# Patient Record
Sex: Female | Born: 1990 | Race: White | Hispanic: No | Marital: Single | Smoking: Former smoker
Health system: Southern US, Community
[De-identification: ages and names within clinical notes are randomized; demographics above are authoritative.]

## PROBLEM LIST (undated history)

## (undated) DIAGNOSIS — R519 Headache, unspecified: Secondary | ICD-10-CM

## (undated) DIAGNOSIS — Z9889 Other specified postprocedural states: Secondary | ICD-10-CM

## (undated) DIAGNOSIS — D649 Anemia, unspecified: Secondary | ICD-10-CM

## (undated) DIAGNOSIS — F419 Anxiety disorder, unspecified: Secondary | ICD-10-CM

## (undated) DIAGNOSIS — R569 Unspecified convulsions: Secondary | ICD-10-CM

## (undated) DIAGNOSIS — F909 Attention-deficit hyperactivity disorder, unspecified type: Secondary | ICD-10-CM

## (undated) DIAGNOSIS — R51 Headache: Secondary | ICD-10-CM

## (undated) DIAGNOSIS — N83209 Unspecified ovarian cyst, unspecified side: Secondary | ICD-10-CM

## (undated) DIAGNOSIS — N809 Endometriosis, unspecified: Secondary | ICD-10-CM

## (undated) DIAGNOSIS — Z87442 Personal history of urinary calculi: Secondary | ICD-10-CM

## (undated) DIAGNOSIS — Z9189 Other specified personal risk factors, not elsewhere classified: Secondary | ICD-10-CM

## (undated) DIAGNOSIS — M549 Dorsalgia, unspecified: Secondary | ICD-10-CM

## (undated) DIAGNOSIS — G545 Neuralgic amyotrophy: Secondary | ICD-10-CM

## (undated) DIAGNOSIS — F319 Bipolar disorder, unspecified: Secondary | ICD-10-CM

## (undated) DIAGNOSIS — Z9049 Acquired absence of other specified parts of digestive tract: Secondary | ICD-10-CM

## (undated) DIAGNOSIS — N739 Female pelvic inflammatory disease, unspecified: Secondary | ICD-10-CM

## (undated) HISTORY — PX: LAPAROSCOPY: SHX197

## (undated) HISTORY — DX: Anxiety disorder, unspecified: F41.9

## (undated) HISTORY — DX: Unspecified ovarian cyst, unspecified side: N83.209

## (undated) HISTORY — DX: Endometriosis, unspecified: N80.9

## (undated) HISTORY — DX: Female pelvic inflammatory disease, unspecified: N73.9

## (undated) HISTORY — PX: TONSILLECTOMY: SUR1361

---
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
## 2014-12-22 ENCOUNTER — Emergency Department (HOSPITAL_COMMUNITY): Payer: Medicaid Other

## 2014-12-22 ENCOUNTER — Emergency Department (HOSPITAL_COMMUNITY)
Admission: EM | Admit: 2014-12-22 | Discharge: 2014-12-22 | Disposition: A | Discharge status: Home / Self Care | Payer: Medicaid Other | Attending: Emergency Medicine | Admitting: Emergency Medicine

## 2014-12-22 ENCOUNTER — Encounter (HOSPITAL_COMMUNITY): Payer: Self-pay | Admitting: *Deleted

## 2014-12-22 DIAGNOSIS — Z79899 Other long term (current) drug therapy: Secondary | ICD-10-CM | POA: Insufficient documentation

## 2014-12-22 DIAGNOSIS — Z3202 Encounter for pregnancy test, result negative: Secondary | ICD-10-CM | POA: Insufficient documentation

## 2014-12-22 DIAGNOSIS — J45909 Unspecified asthma, uncomplicated: Secondary | ICD-10-CM | POA: Insufficient documentation

## 2014-12-22 DIAGNOSIS — R1031 Right lower quadrant pain: Secondary | ICD-10-CM | POA: Insufficient documentation

## 2014-12-22 DIAGNOSIS — R109 Unspecified abdominal pain: Secondary | ICD-10-CM

## 2014-12-22 LAB — CBC WITH DIFFERENTIAL/PLATELET
BASOS ABS: 0 10*3/uL (ref 0.0–0.1)
Basophils Relative: 0 % (ref 0–1)
Eosinophils Absolute: 0.1 10*3/uL (ref 0.0–0.7)
Eosinophils Relative: 1 % (ref 0–5)
HCT: 38.3 % (ref 36.0–46.0)
Hemoglobin: 13.2 g/dL (ref 12.0–15.0)
LYMPHS ABS: 2 10*3/uL (ref 0.7–4.0)
LYMPHS PCT: 24 % (ref 12–46)
MCH: 30.1 pg (ref 26.0–34.0)
MCHC: 34.5 g/dL (ref 30.0–36.0)
MCV: 87.4 fL (ref 78.0–100.0)
MONO ABS: 0.7 10*3/uL (ref 0.1–1.0)
Monocytes Relative: 9 % (ref 3–12)
NEUTROS PCT: 66 % (ref 43–77)
Neutro Abs: 5.4 10*3/uL (ref 1.7–7.7)
PLATELETS: 295 10*3/uL (ref 150–400)
RBC: 4.38 MIL/uL (ref 3.87–5.11)
RDW: 11.6 % (ref 11.5–15.5)
WBC: 8.2 10*3/uL (ref 4.0–10.5)

## 2014-12-22 LAB — COMPREHENSIVE METABOLIC PANEL
ALK PHOS: 51 U/L (ref 39–117)
ALT: 11 U/L (ref 0–35)
ANION GAP: 10 (ref 5–15)
AST: 25 U/L (ref 0–37)
Albumin: 4 g/dL (ref 3.5–5.2)
BUN: 7 mg/dL (ref 6–23)
CALCIUM: 9.3 mg/dL (ref 8.4–10.5)
CHLORIDE: 102 mmol/L (ref 96–112)
CO2: 24 mmol/L (ref 19–32)
CREATININE: 0.67 mg/dL (ref 0.50–1.10)
GFR calc non Af Amer: >90 mL/min (ref >90)
GLUCOSE: 92 mg/dL (ref 70–99)
Potassium: 3.6 mmol/L (ref 3.5–5.1)
SODIUM: 136 mmol/L (ref 135–145)
TOTAL PROTEIN: 7.4 g/dL (ref 6.0–8.3)
Total Bilirubin: 0.3 mg/dL (ref 0.3–1.2)

## 2014-12-22 LAB — URINE MICROSCOPIC-ADD ON

## 2014-12-22 LAB — I-STAT CHEM 8, ED
BUN: 7 mg/dL (ref 6–23)
CREATININE: 0.6 mg/dL (ref 0.50–1.10)
Calcium, Ion: 1.16 mmol/L (ref 1.12–1.23)
Chloride: 100 mmol/L (ref 96–112)
Glucose, Bld: 95 mg/dL (ref 70–99)
HCT: 42 % (ref 36.0–46.0)
HEMOGLOBIN: 14.3 g/dL (ref 12.0–15.0)
Potassium: 3.7 mmol/L (ref 3.5–5.1)
SODIUM: 139 mmol/L (ref 135–145)
TCO2: 22 mmol/L (ref 0–100)

## 2014-12-22 LAB — URINALYSIS, ROUTINE W REFLEX MICROSCOPIC
Bilirubin Urine: NEGATIVE
Glucose, UA: NEGATIVE mg/dL
Ketones, ur: NEGATIVE mg/dL
LEUKOCYTES UA: NEGATIVE
NITRITE: NEGATIVE
PH: 7.5 (ref 5.0–8.0)
Protein, ur: NEGATIVE mg/dL
SPECIFIC GRAVITY, URINE: 1.01 (ref 1.005–1.030)
UROBILINOGEN UA: 0.2 mg/dL (ref 0.0–1.0)

## 2014-12-22 LAB — POC URINE PREG, ED: PREG TEST UR: NEGATIVE

## 2014-12-22 IMAGING — CT CT ABD-PELV W/O CM
2 of 4 series · 16 of 46 positions shown, 18 images · non-contrast
Comparison: [DATE]

CLINICAL DATA: Right flank pain and fever for 5 days.

EXAM:
CT ABDOMEN AND PELVIS WITHOUT CONTRAST
TECHNIQUE: Multidetector CT imaging of the abdomen and pelvis was performed
following the standard protocol without IV contrast.

[Series 2: abd/ pelvis 5.0 i30f 1 · axial · 0.65mm/px · z∈[-498,-108]mm · 13 of 86 slices shown, 15 images]
[im 4/86  soft-tissue]
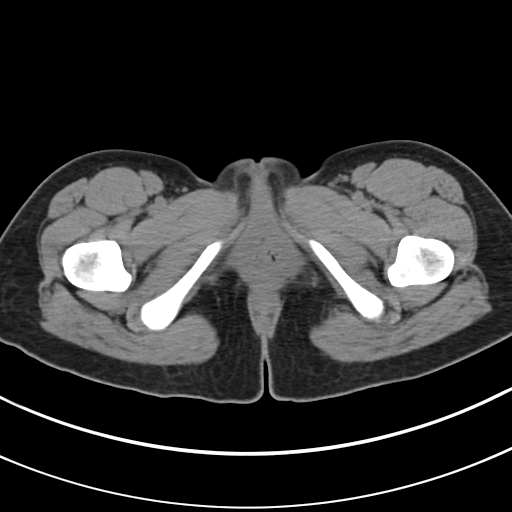
[im 4/86  bone]
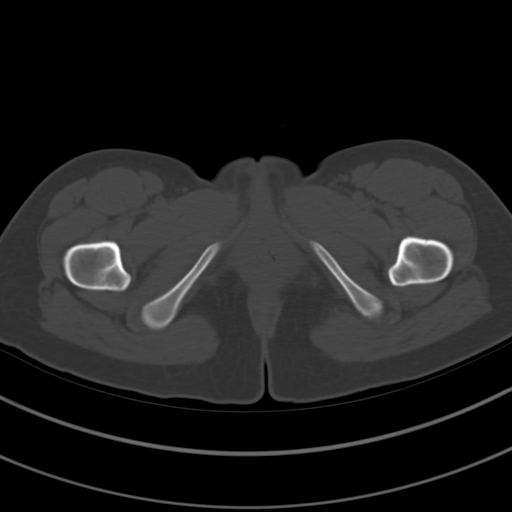
[im 11/86  soft-tissue]
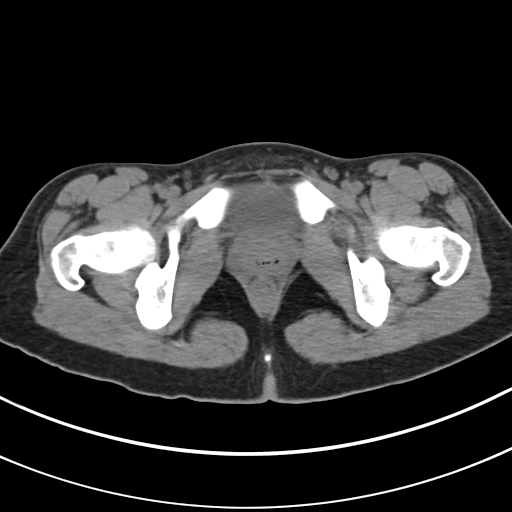
[im 18/86  soft-tissue]
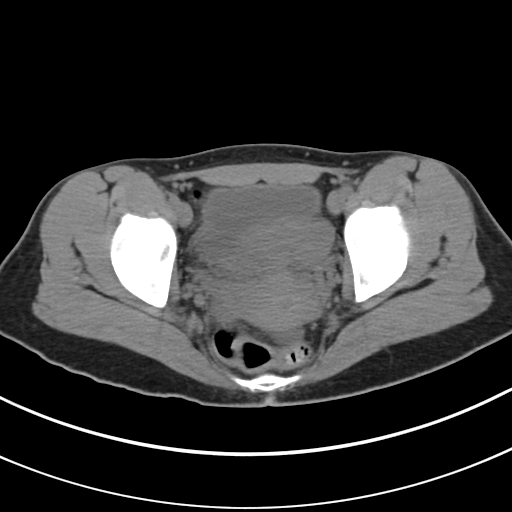
[im 25/86  soft-tissue]
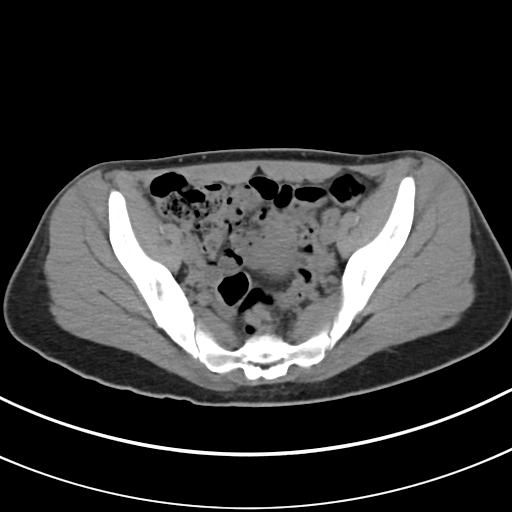
[im 29/86  soft-tissue]
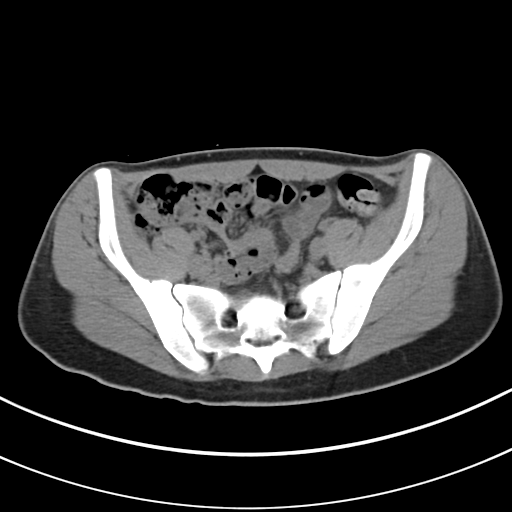
[im 36/86  soft-tissue]
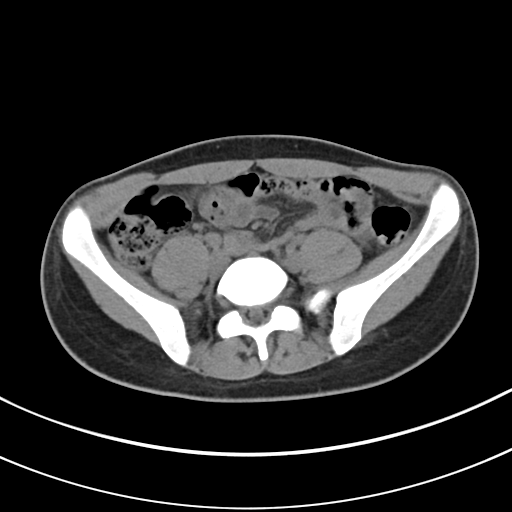
[im 43/86  soft-tissue]
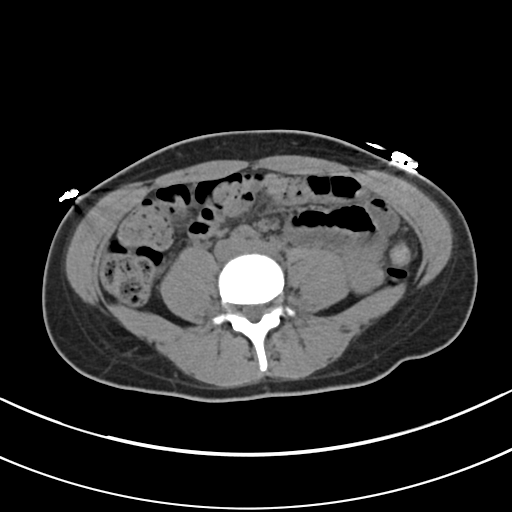
[im 50/86  soft-tissue]
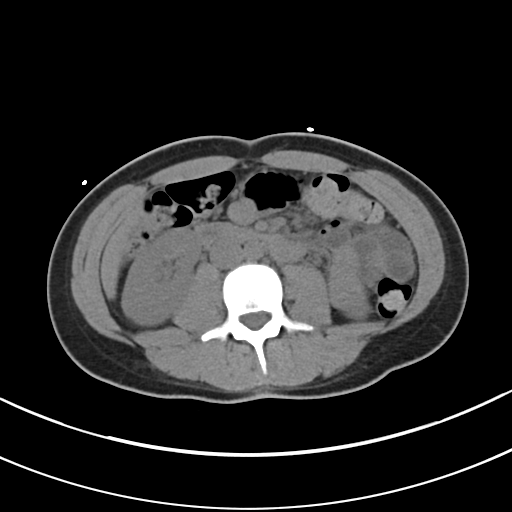
[im 57/86  soft-tissue]
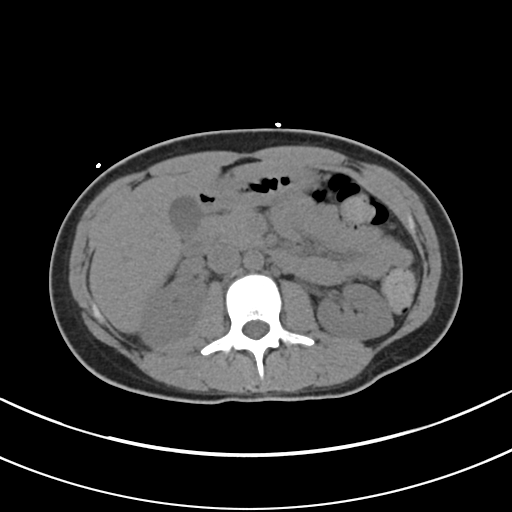
[im 57/86  bone]
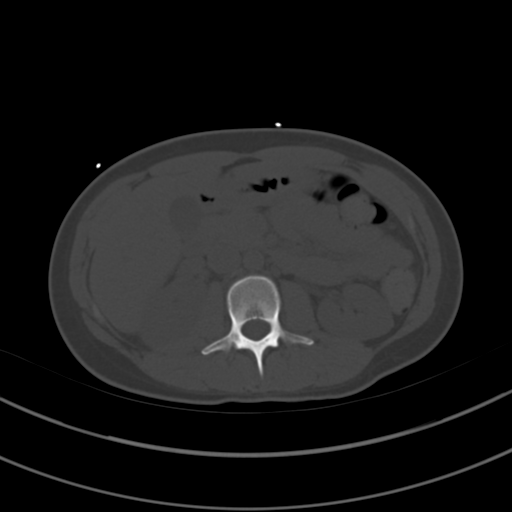
[im 61/86  soft-tissue]
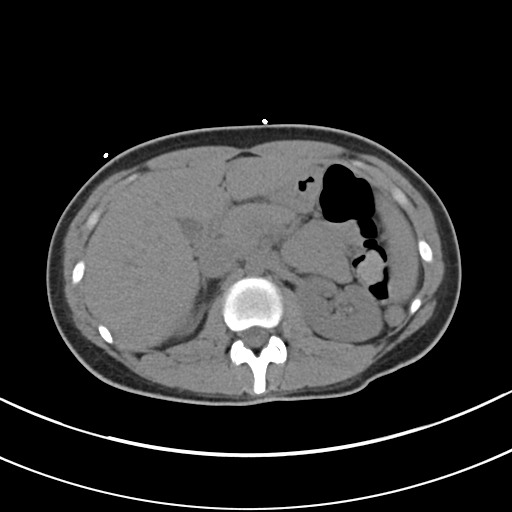
[im 68/86  soft-tissue]
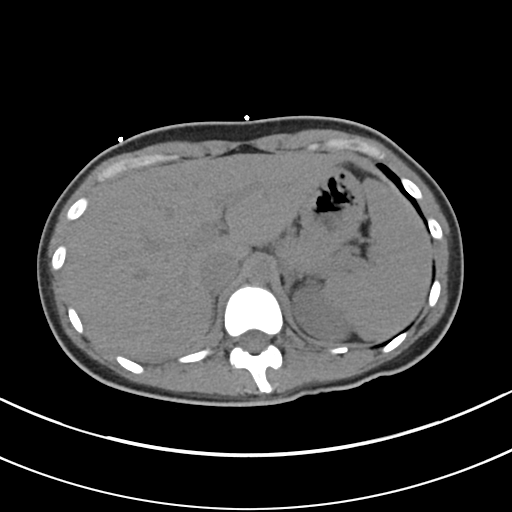
[im 75/86  soft-tissue]
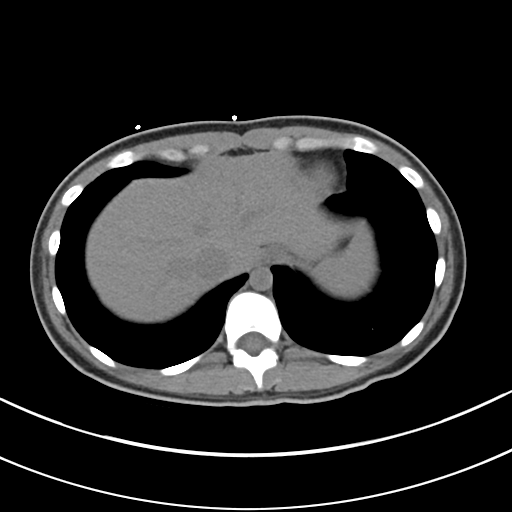
[im 82/86  soft-tissue]
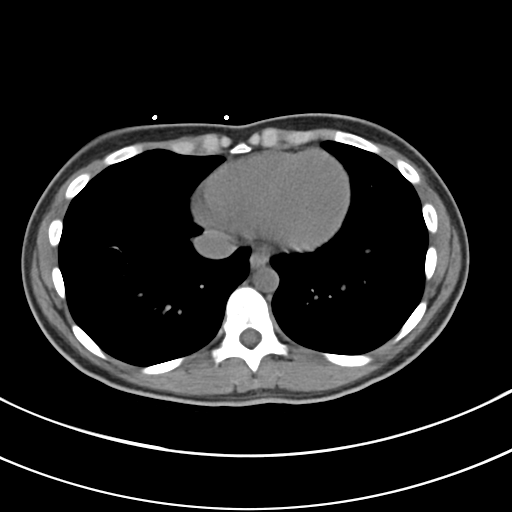

[Series 5: coronals · coronal · 0.61mm/px · 3 of 97 slices shown]
[im 33/97  soft-tissue]
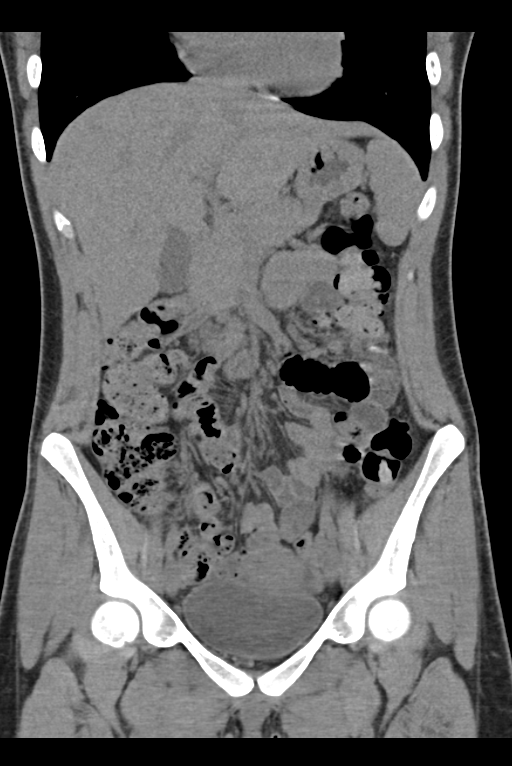
[im 43/97  soft-tissue]
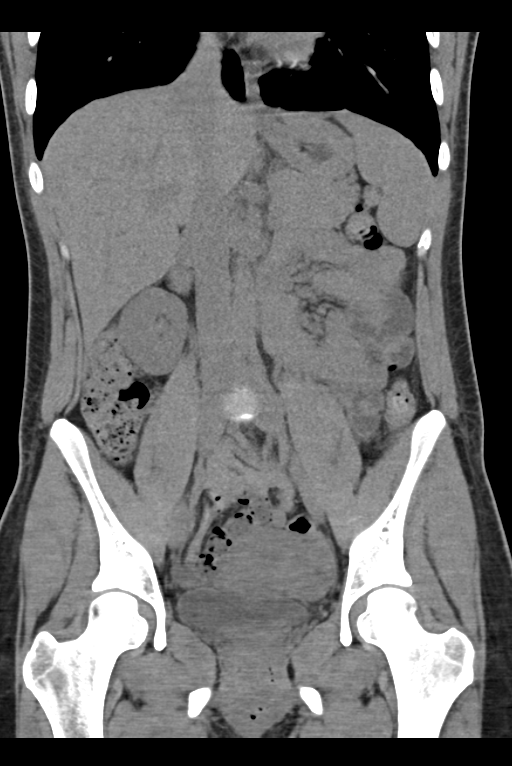
[im 54/97  soft-tissue]
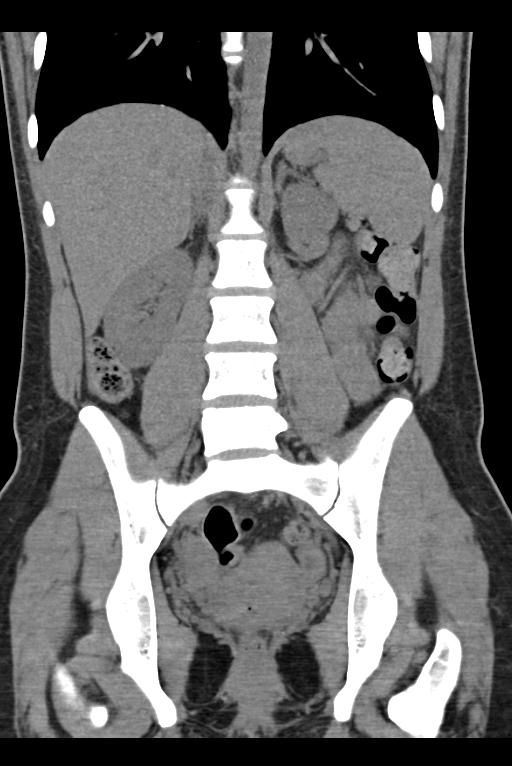

[16 of 46 positions shown; findings below may reference images not displayed]

FINDINGS: Lung bases are clear.

Tiny punctate calcifications are demonstrated in both kidneys,
suggesting stones measuring less than 1 mm diameter. No
hydronephrosis or hydroureter on either side. No ureteral or bladder
stones. Bladder wall is not thickened.

The unenhanced appearance of the liver, spleen, gallbladder,
pancreas, adrenal glands, abdominal aorta, inferior vena cava, and
retroperitoneal lymph nodes is unremarkable. Stomach, small bowel,
and colon are mostly decompressed. No evidence of obstruction. No
free air or free fluid in the abdomen.

Pelvis: Uterus and ovaries are not enlarged. No free or loculated
pelvic fluid collections. No pelvic mass or lymphadenopathy.
Appendix is not definitively No destructive bone lesions.
IMPRESSION: Tiny punctate nonobstructing stones in the kidneys. No ureteral
stone or obstruction.

## 2014-12-22 MED ORDER — IOHEXOL 300 MG/ML  SOLN: 25.0000 mL | INTRAMUSCULAR | Status: DC

## 2014-12-22 MED ORDER — IBUPROFEN 800 MG PO TABS: 800.0000 mg | ORAL_TABLET | Freq: Three times a day (TID) | ORAL | Status: DC | PRN

## 2014-12-22 MED ORDER — PROMETHAZINE HCL 25 MG PO TABS: 25.0000 mg | ORAL_TABLET | Freq: Three times a day (TID) | ORAL | Status: DC | PRN

## 2014-12-22 MED ORDER — SODIUM CHLORIDE 0.9 % IV BOLUS (SEPSIS): 1000.0000 mL | Freq: Once | INTRAVENOUS | Status: DC

## 2014-12-22 MED ORDER — HYDROCODONE-ACETAMINOPHEN 5-325 MG PO TABS: 1.0000 | ORAL_TABLET | Freq: Four times a day (QID) | ORAL | Status: DC | PRN

## 2014-12-22 NOTE — ED Notes (Addendum)
Rt. Flank pain x 5 days, and is radiating to the front. Did have fever of 102 F Friday night.

## 2014-12-22 NOTE — ED Provider Notes (Signed)
CSN: [id number]     Arrival date & time 12/22/14  1739 History   First MD Initiated Contact with Patient 12/22/14 2034     Chief Complaint  Patient presents with  . Flank Pain     (Consider location/radiation/quality/duration/timing/severity/associated sxs/prior Treatment) HPI Patient presents to the emergency department with right flank pain for the past 5 days.  Patient, states, that is radiating to her right lower side.  The patient states she does not have chest pain, shortness of breath, vaginal bleeding, vaginal discharge, weakness, dizziness, headache, blurred vision, back pain, neck pain, cough, dysuria, or syncope.  The patient states that she thinks she had a fever on Friday.  Patient states that seems make her condition better or worse Past Medical History  Diagnosis Date  . Asthma   . Back pain    No past surgical history on file. No family history on file. History  Substance Use Topics  . Smoking status: Not on file  . Smokeless tobacco: Not on file  . Alcohol Use: No   OB History    No data available     Review of Systems  All other systems negative except as documented in the HPI. All pertinent positives and negatives as reviewed in the HPI.[email or web address]   Allergies  Tramadol  Home Medications   Prior to Admission medications   Medication Sig Start Date End Date Taking? Authorizing Provider  albuterol (PROVENTIL HFA;VENTOLIN HFA) 108 (90 BASE) MCG/ACT inhaler Inhale 2 puffs into the lungs every 6 (six) hours as needed.   Yes Historical Provider, MD   BP 110/55 mmHg  Pulse 59  Temp(Src) 98.2 F (36.8 C) (Oral)  Resp 11  Ht  (1.626 m)  Wt 110 lb (49.896 kg)  BMI 18.87 kg/m2  SpO2 100%  LMP 11/23/2014 Physical Exam  Constitutional: She is oriented to person, place, and time. She appears well-developed and well-nourished. No distress.  HENT:  Head: Normocephalic and atraumatic.  Mouth/Throat: Oropharynx is clear and moist.  Eyes: Pupils are equal,  round, and reactive to light.  Neck: Normal range of motion. Neck supple.  Cardiovascular: Normal rate, regular rhythm and normal heart sounds.  Exam reveals no gallop and no friction rub.   No murmur heard. Pulmonary/Chest: Effort normal and breath sounds normal. No respiratory distress.  Abdominal: Soft. Normal appearance and bowel sounds are normal. She exhibits no distension. There is tenderness. There is no rebound and no guarding.    Neurological: She is alert and oriented to person, place, and time. She exhibits normal muscle tone. Coordination normal.  Skin: Skin is warm and dry. No rash noted. No erythema.    ED Course  Procedures (including critical care time) Labs Review Labs Reviewed  URINALYSIS, ROUTINE W REFLEX MICROSCOPIC - Abnormal; Notable for the following:    Hgb urine dipstick SMALL (*)    All other components within normal limits  URINE MICROSCOPIC-ADD ON - Abnormal; Notable for the following:    Squamous Epithelial / LPF FEW (*)    All other components within normal limits  CBC WITH DIFFERENTIAL/PLATELET  COMPREHENSIVE METABOLIC PANEL  POC URINE PREG, ED  I-STAT CHEM 8, ED    Imaging Review Ct Abdomen Pelvis Wo Contrast  12/22/2014   CLINICAL DATA:  Right flank pain and fever for 5 days.  EXAM: CT ABDOMEN AND PELVIS WITHOUT CONTRAST  TECHNIQUE: Multidetector CT imaging of the abdomen and pelvis was performed following the standard protocol without IV contrast.  COMPARISON:  06/19/2011  FINDINGS: Lung bases are clear.  Tiny punctate calcifications are demonstrated in both kidneys, suggesting stones measuring less than 1 mm diameter. No hydronephrosis or hydroureter on either side. No ureteral or bladder stones. Bladder wall is not thickened.  The unenhanced appearance of the liver, spleen, gallbladder, pancreas, adrenal glands, abdominal aorta, inferior vena cava, and retroperitoneal lymph nodes is unremarkable. Stomach, small bowel, and colon are mostly  decompressed. No evidence of obstruction. No free air or free fluid in the abdomen.  Pelvis: Uterus and ovaries are not enlarged. No free or loculated pelvic fluid collections. No pelvic mass or lymphadenopathy. Appendix is not definitively No destructive bone lesions.  IMPRESSION: Tiny punctate nonobstructing stones in the kidneys. No ureteral stone or obstruction.   Electronically Signed   By: Burman NievesWilliam  Stevens M.D.   On: 12/22/2014 22:48    Patient is referred to GYN.  Told to return here as needed.  Patient's CT scan did not show any definitive cause for her pain.  Advised her that this could be an evolving process and to return here for any worsening in her condition  MDM   Final diagnoses:  Flank pain       Carlyle DollyChristopher W Blandon Offerdahl, PA-C 12/25/14 2122  Flint MelterElliott L Wentz, MD 12/29/14 [phone]

## 2014-12-22 NOTE — ED Notes (Signed)
Ordered hosp bed 

## 2014-12-22 NOTE — Discharge Instructions (Signed)
Return here as needed. Follow up with the GYN clinic provided. Increase your fluid intake.

## 2015-09-22 ENCOUNTER — Emergency Department (HOSPITAL_COMMUNITY)
Admission: EM | Admit: 2015-09-22 | Discharge: 2015-09-22 | Disposition: A | Discharge status: Home / Self Care | Payer: Medicaid Other | Attending: Emergency Medicine | Admitting: Emergency Medicine

## 2015-09-22 ENCOUNTER — Encounter (HOSPITAL_COMMUNITY): Payer: Self-pay | Admitting: Emergency Medicine

## 2015-09-22 DIAGNOSIS — Z79899 Other long term (current) drug therapy: Secondary | ICD-10-CM | POA: Insufficient documentation

## 2015-09-22 DIAGNOSIS — J45909 Unspecified asthma, uncomplicated: Secondary | ICD-10-CM | POA: Insufficient documentation

## 2015-09-22 DIAGNOSIS — B354 Tinea corporis: Secondary | ICD-10-CM | POA: Insufficient documentation

## 2015-09-22 NOTE — ED Notes (Signed)
Pt st's she has a ring worm on right upper back.  St's it's been there for approx 5 weeks and she has been using OTC cream for ring worm on it.  Pt st's it's much better but she needs a note saying she is not contagious and she can return to work

## 2015-09-22 NOTE — Discharge Instructions (Signed)
Continue using your Clotrimazole 1% cream twice daily for 4 weeks total. Ring worm is contagious with skin to skin contact until the lesion has resolved. Follow-up with your primary care provider. Return to the emergency department if symptoms worsen or new onset of fever.

## 2015-09-22 NOTE — ED Provider Notes (Signed)
CSN: [id number]     Arrival date & time 09/22/15  1543 History   By signing my name below, I, Abigail Harding, attest that this documentation has been prepared under the direction and in the presence of [employer], [place]. Electronically Signed: Evon Harding, ED Scribe. 09/22/2015. 4:55 PM.     Chief Complaint  Patient presents with  . Rash   The history is provided by the patient. No language interpreter was used.   HPI Comments: Abigail Harding is a 24 y.o. female who presents to the Emergency Department complaining of new rash to her posterior right shoulder for 5 weeks. Pt states that she thinks that she thinks the rash is ringworm. Pt state that she has been using a OTC fungal cream with relief for the past 2 weeks. Pt is requesting a medical note saying that the rash is not contagious so that she can return to work. Pt states that the rash is not itchy or painful. Pt denies any new soaps, lotions, detergents or medications. Pt denies fever or any other symptoms at this time.   Past Medical History  Diagnosis Date  . Asthma   . Back pain    History reviewed. No pertinent past surgical history. No family history on file. Social History  Substance Use Topics  . Smoking status: Never Smoker   . Smokeless tobacco: None  . Alcohol Use: No   OB History    No data available     Review of Systems  Constitutional: Negative for fever.  Gastrointestinal: Negative for abdominal pain.  Skin: Positive for rash.      Allergies  Tramadol  Home Medications   Prior to Admission medications   Medication Sig Start Date End Date Taking? Authorizing Provider  albuterol (PROVENTIL HFA;VENTOLIN HFA) 108 (90 BASE) MCG/ACT inhaler Inhale 2 puffs into the lungs every 6 (six) hours as needed.    Historical Provider, MD  HYDROcodone-acetaminophen (NORCO/VICODIN) 5-325 MG per tablet Take 1 tablet by mouth every 6 (six) hours as needed for moderate pain. 12/22/14   Charlestine Night, PA-C  ibuprofen (ADVIL,MOTRIN) 800 MG tablet Take 1 tablet (800 mg total) by mouth every 8 (eight) hours as needed. 12/22/14   Charlestine Night, PA-C  promethazine (PHENERGAN) 25 MG tablet Take 1 tablet (25 mg total) by mouth every 8 (eight) hours as needed for nausea or vomiting. 12/22/14   Christopher Lawyer, PA-C   BP 120/68 mmHg  Pulse 67  Temp(Src) 98.3 F (36.8 C) (Oral)  Resp 18  SpO2 99%  LMP 09/15/2015   Physical Exam  Constitutional: She is oriented to person, place, and time. She appears well-developed and well-nourished. No distress.  HENT:  Head: Normocephalic and atraumatic.  Mouth/Throat: Uvula is midline, oropharynx is clear and moist and mucous membranes are normal. No oropharyngeal exudate or posterior oropharyngeal erythema.  Eyes: Conjunctivae and EOM are normal.  Neck: Neck supple. No tracheal deviation present.  Cardiovascular: Normal rate.   Pulmonary/Chest: Effort normal. No respiratory distress.  Musculoskeletal: Normal range of motion.  Neurological: She is alert and oriented to person, place, and time.  Skin: Skin is warm and dry.  1 cm raised lesion noted to right scapula region with surrounding erythema and scaling and central clearing, blanching present. No vesicles, no papules.    Psychiatric: She has a normal mood and affect. Her behavior is normal.  Nursing note and vitals reviewed.   ED Course  Procedures (including critical care time) DIAGNOSTIC STUDIES: Oxygen Saturation  is 99% on RA, normal by my interpretation.    COORDINATION OF CARE: 4:55 PM-Discussed treatment plan with pt at bedside and pt agreed to plan.     Labs Review Labs Reviewed - No data to display  Imaging Review No results found.  Filed Vitals:   09/22/15 1604  BP: 120/68  Pulse: 67  Temp: 98.3 F (36.8 C)  Resp: 18      MDM   Final diagnoses:  Tinea corporis   Patient presents with rash to right upper back for the past 2 weeks. Reports mild relief  with over-the-counter fungal cream she has been using. Denies any new irritants or medications. Requesting note for work. VSS. Exam revealed 1 cm circular raised lesion to right scapular area with surrounding erythema and central clearing with scaling, no drainage. Lesion is consistent with tinea corporis. Plan to discharge patient home. Advised patient to continue using clotrimazole cream twice daily for 4 weeks total. 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   Evaluation does not show pathology requring ongoing emergent intervention or admission. Pt is hemodynamically stable and mentating appropriately. Discussed findings/results and plan with patient/guardian, who agrees with plan. All questions answered. Return precautions discussed and outpatient follow up given.     I personally performed the services described in this documentation, which was scribed in my presence. The recorded information has been reviewed and is accurate.      [name], [place]-C 09/22/15 1908  Azalia BilisKevin Campos, MD 09/23/15 [phone]

## 2015-11-17 DIAGNOSIS — N970 Female infertility associated with anovulation: Secondary | ICD-10-CM | POA: Insufficient documentation

## 2016-03-02 DIAGNOSIS — F411 Generalized anxiety disorder: Secondary | ICD-10-CM | POA: Insufficient documentation

## 2016-03-16 DIAGNOSIS — G8929 Other chronic pain: Secondary | ICD-10-CM | POA: Insufficient documentation

## 2016-03-16 DIAGNOSIS — R102 Pelvic and perineal pain: Secondary | ICD-10-CM

## 2016-08-09 IMAGING — US US ART/VEN ABD/PELV/SCROTUM DOPPLER LTD
1 series · 13 of 25 positions shown · non-contrast
Comparison: abdominopelvic CT scan [DATE]

CLINICAL DATA: Bilateral pelvic pain for past week, symptoms worse
today.

EXAM:
TRANSABDOMINAL AND TRANSVAGINAL ULTRASOUND OF PELVIS
DOPPLER ULTRASOUND OF OVARIES
TECHNIQUE: Both transabdominal and transvaginal ultrasound examinations of the
pelvis were performed. Transabdominal technique was performed for
global imaging of the pelvis including uterus, ovaries, adnexal
regions, and pelvic cul-de-sac.
It was necessary to proceed with endovaginal exam following the
transabdominal exam to visualize the uterus, endometrium, ovaries,
and adnexal structures.. Color and duplex Doppler ultrasound was
utilized to evaluate blood flow to the ovaries.

[Series 1: us art/ven abd/pelv/scrotum doppler ltd · 0.17mm/px · 13 of 72 slices shown]
[im 1/72]
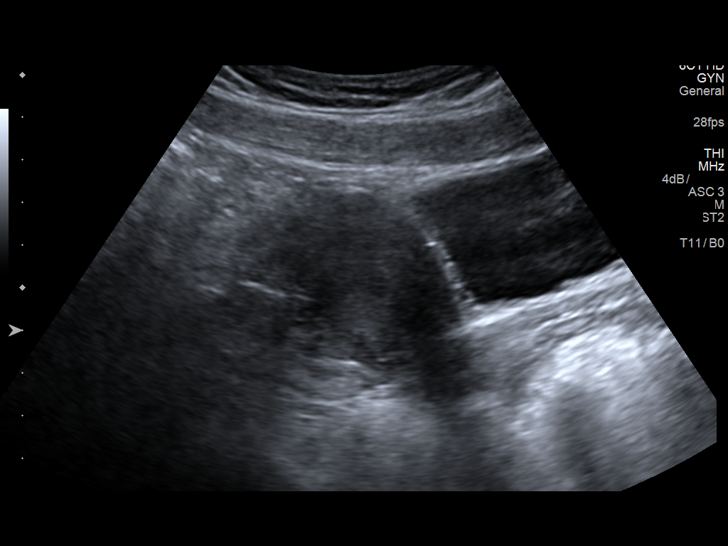
[im 6/72]
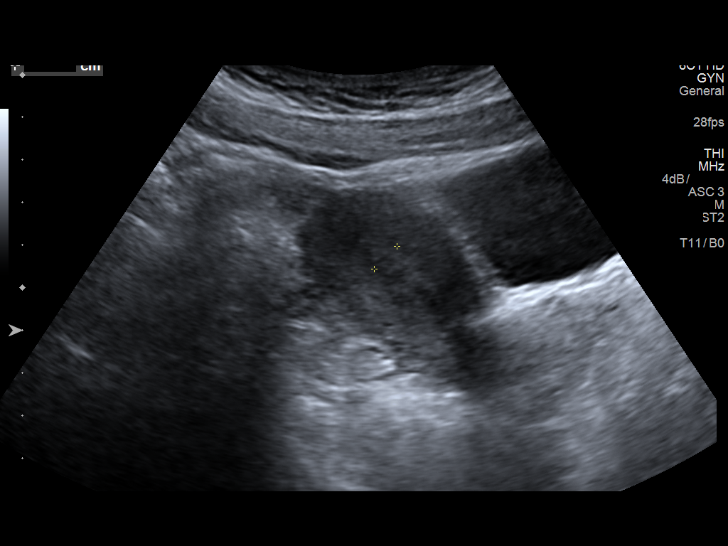
[im 12/72]
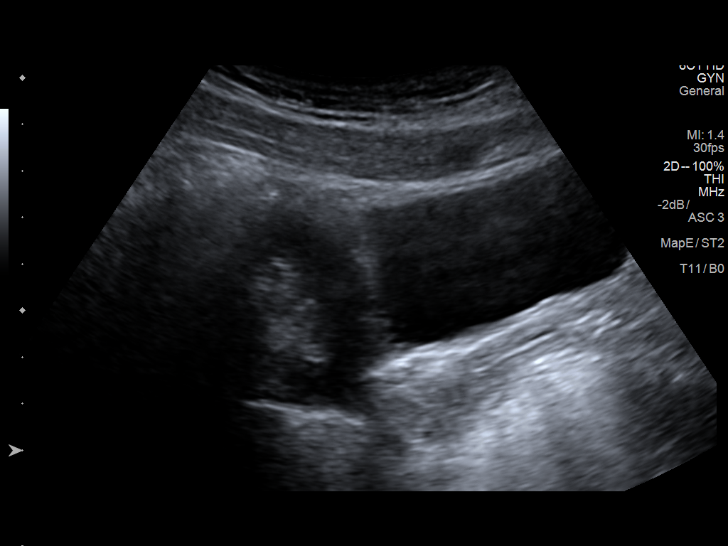
[im 18/72]
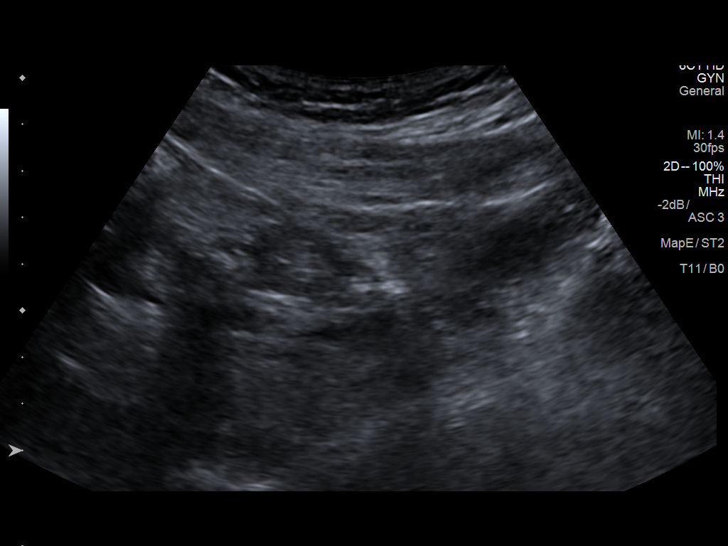
[im 24/72]
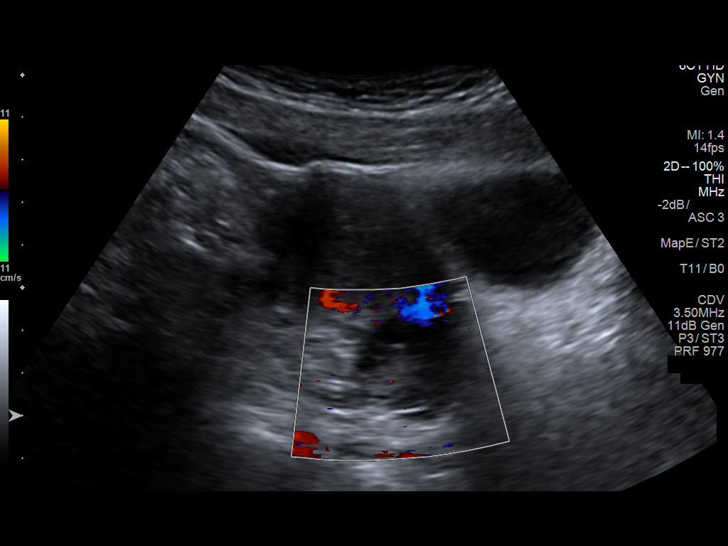
[im 30/72]
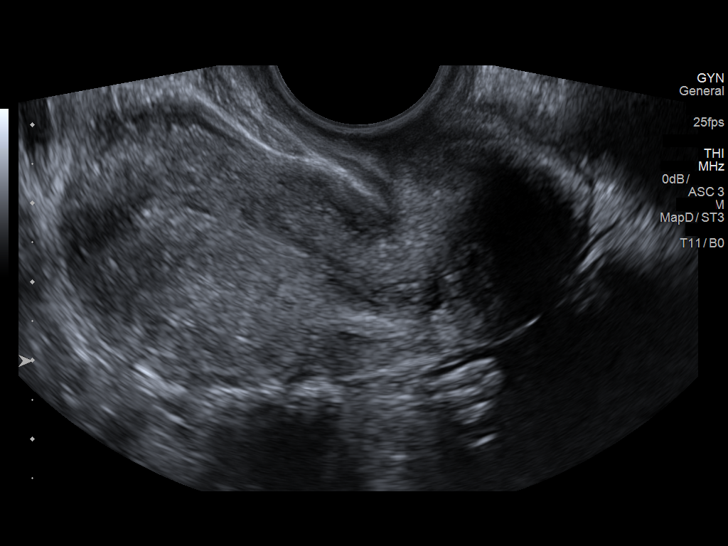
[im 36/72]
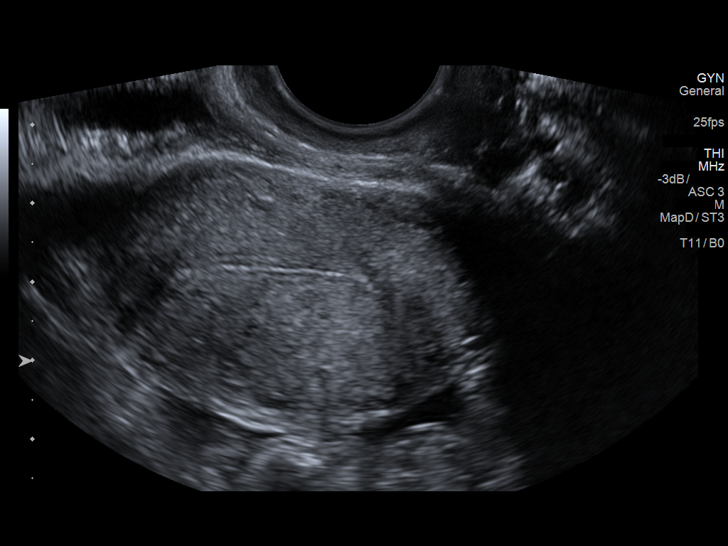
[im 42/72]
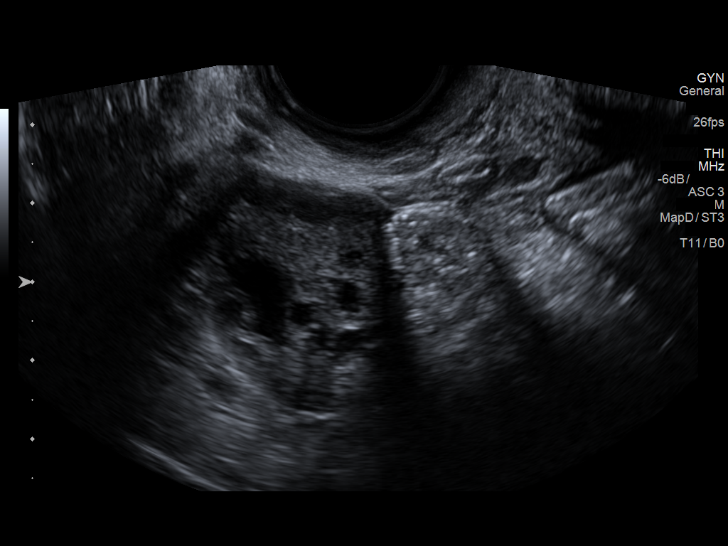
[im 48/72]
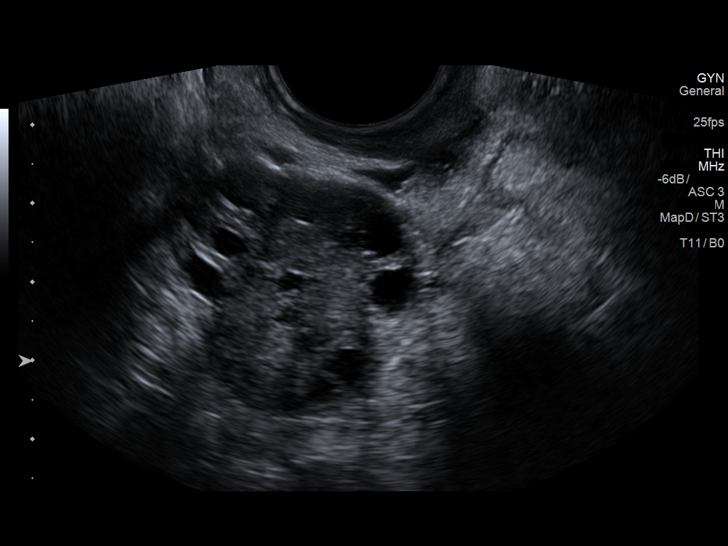
[im 54/72]
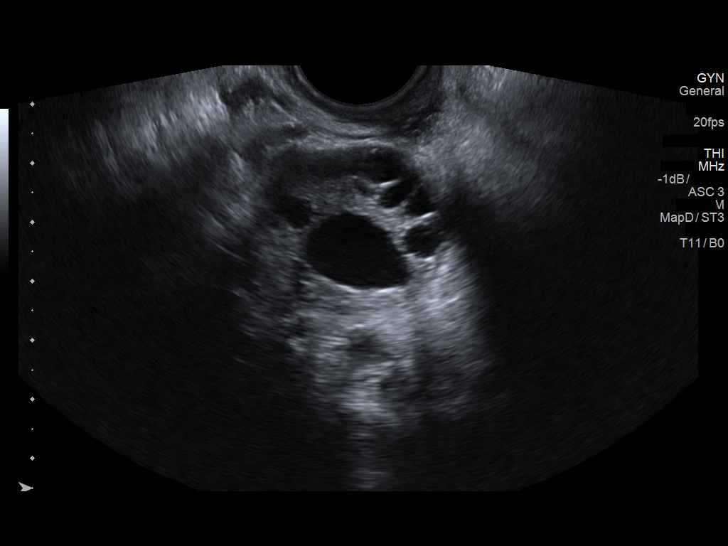
[im 60/72]
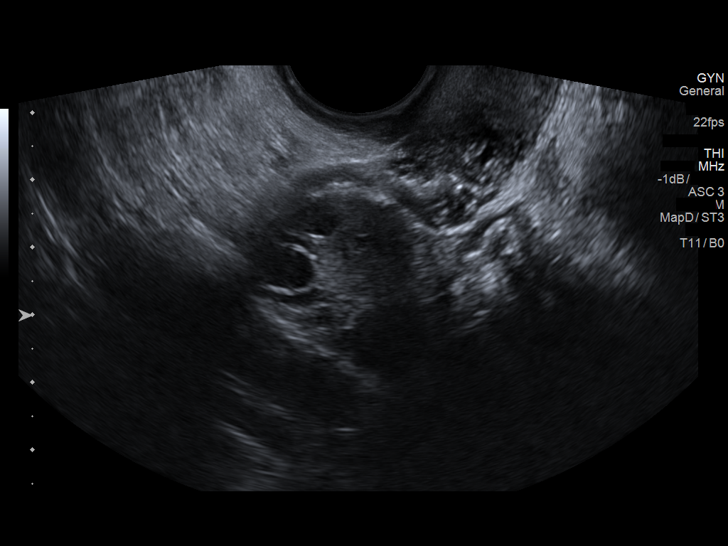
[im 66/72]
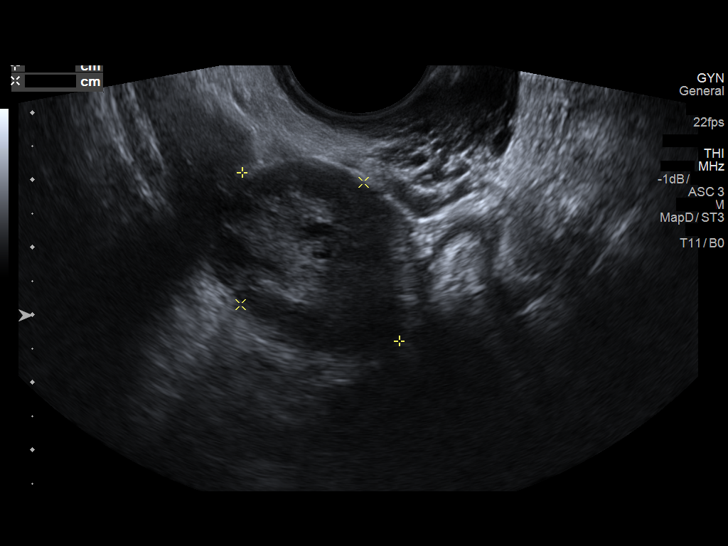
[im 72/72]
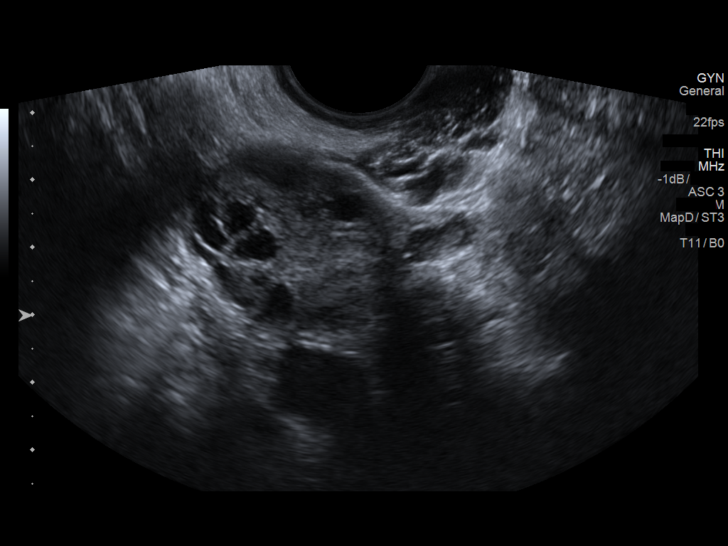

[13 of 25 positions shown; findings below may reference images not displayed]

FINDINGS: Uterus

Measurements: 6.4 x 3.4 x 5.1 cm. No fibroids or other mass
visualized.

Endometrium

Thickness: 4.6 mm.  No focal abnormality visualized.

Right ovary

Measurements: 3.1 x 3.3 x 2.5 cm.. There is a simple appearing right
ovarian cyst measuring 1.8 x 1.2 x 1.0 cm. There are multiple
developing follicles.

Left ovary

Measurements: 3.4 x 2.6 x 2.7 cm.. There are multiple developing
follicles.

Pulsed Doppler evaluation of both ovaries demonstrates normal
low-resistance arterial and venous waveforms.

Other findings

There is a trace of free pelvic fluid.
IMPRESSION: No evidence of ovarian torsion.

Dominant follicle versus small simple cyst in the right ovary
measuring 1.8 x 1.2 x 1 cm.

Normal appearance of the uterus and endometrium. Trace of free
pelvic fluid.

## 2016-09-02 IMAGING — US US ART/VEN ABD/PELV/SCROTUM DOPPLER LTD
1 series · 13 of 25 positions shown · non-contrast
Comparison: [DATE]

CLINICAL DATA: PERSISTENT CHRONIC PELVIC PAIN FOR 1 YEAR, WORSE
OVERNIGHT.

EXAM:
TRANSABDOMINAL AND TRANSVAGINAL ULTRASOUND OF PELVIS
DOPPLER ULTRASOUND OF OVARIES
TECHNIQUE: Both transabdominal and transvaginal ultrasound examinations of the
pelvis were performed. Transabdominal technique was performed for
global imaging of the pelvis including uterus, ovaries, adnexal
regions, and pelvic cul-de-sac.
It was necessary to proceed with endovaginal exam following the
transabdominal exam to visualize the ovaries and adnexae in better
detail. Color and duplex Doppler ultrasound was utilized to evaluate
blood flow to the ovaries.

[Series 1: us art/ven abd/pelv/scrotum doppler ltd · 0.20mm/px · 13 of 84 slices shown]
[im 1/84]
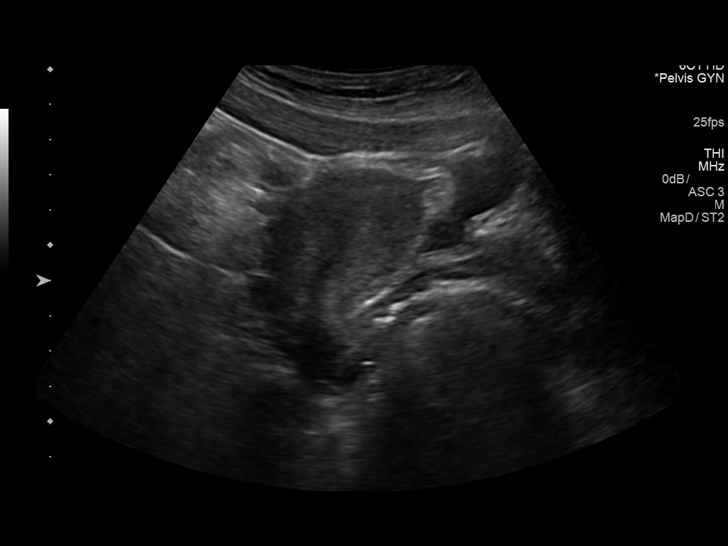
[im 7/84]
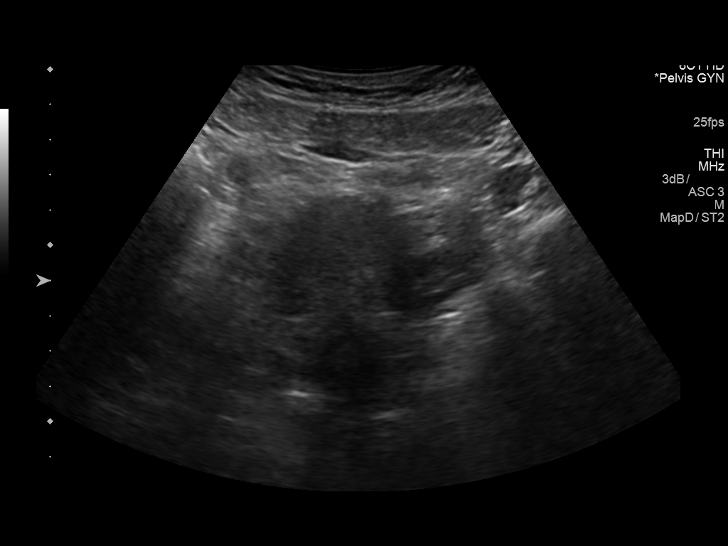
[im 14/84]
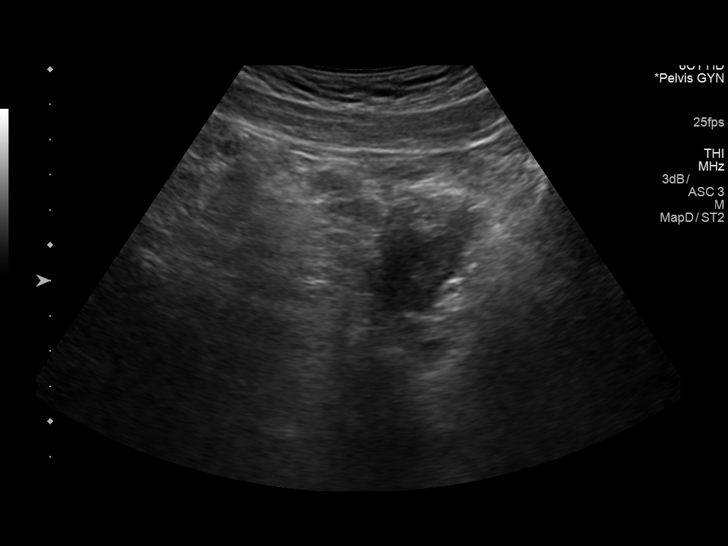
[im 21/84]
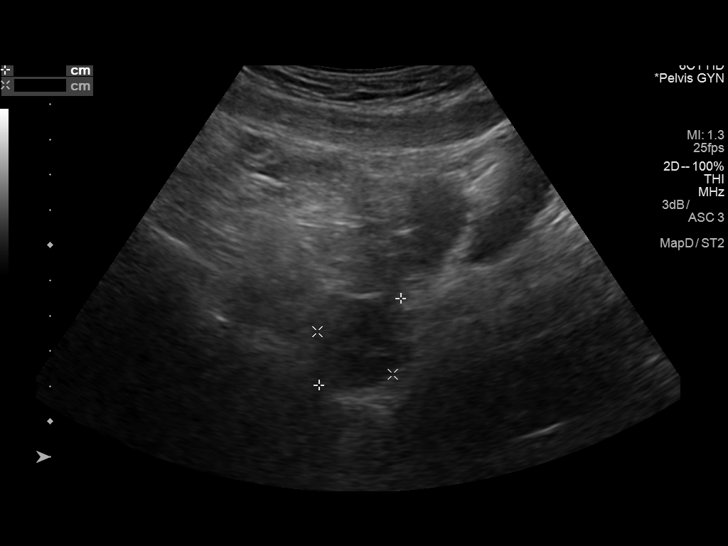
[im 28/84]
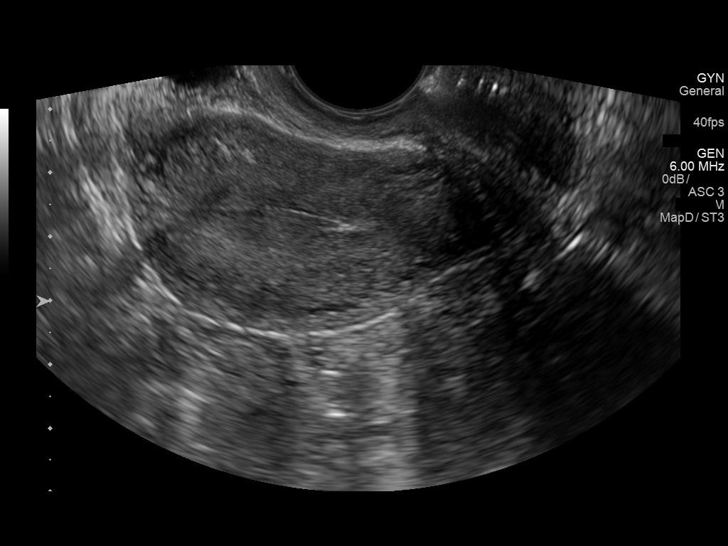
[im 35/84]
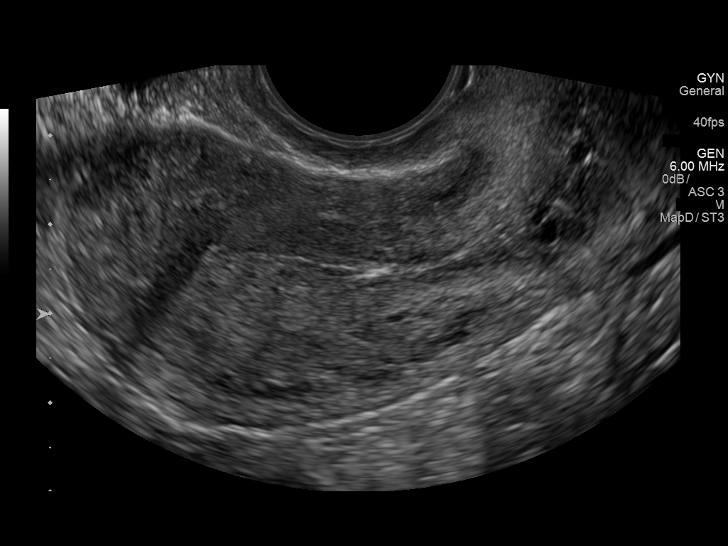
[im 42/84]
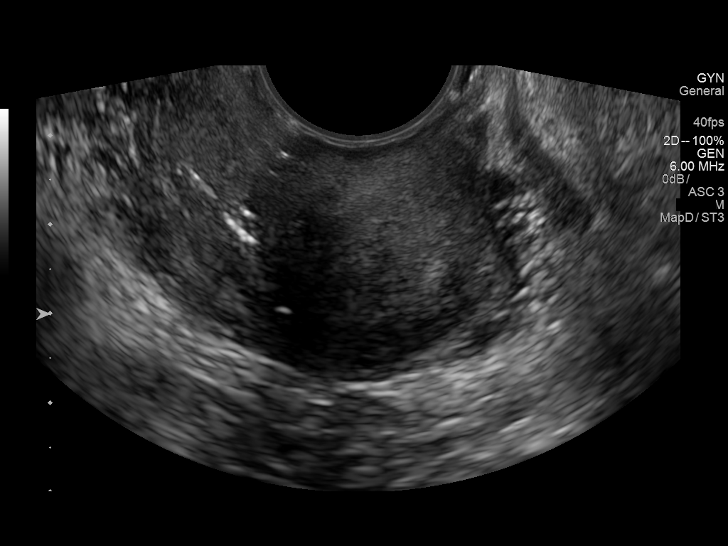
[im 49/84]
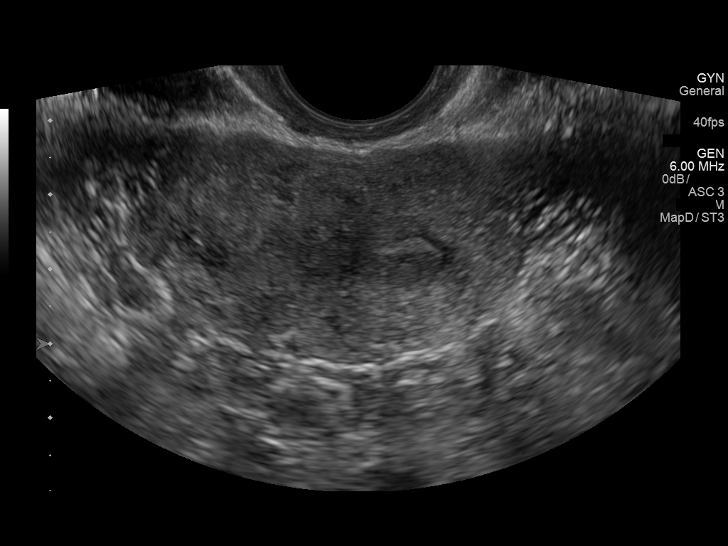
[im 56/84]
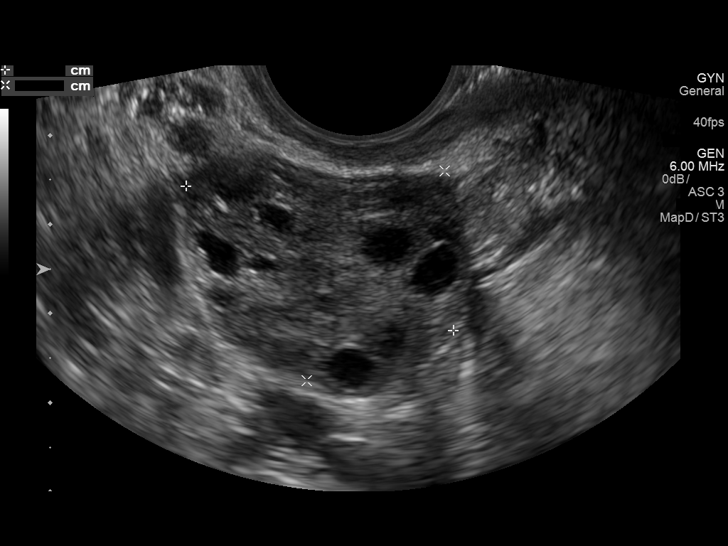
[im 63/84]
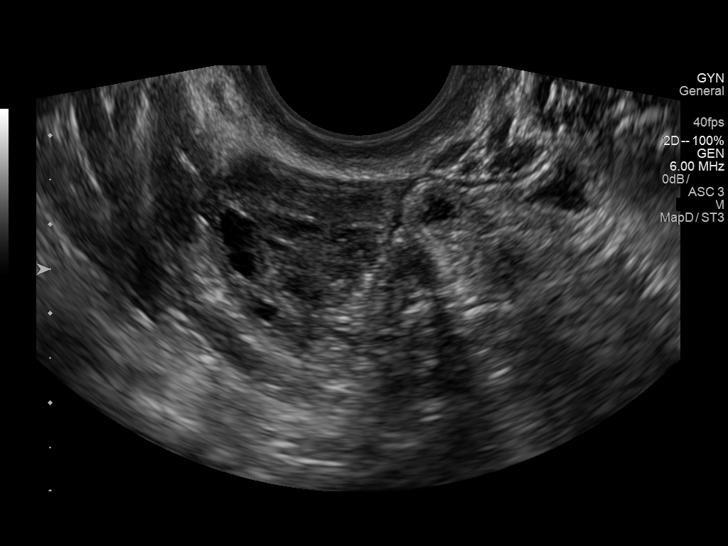
[im 70/84]
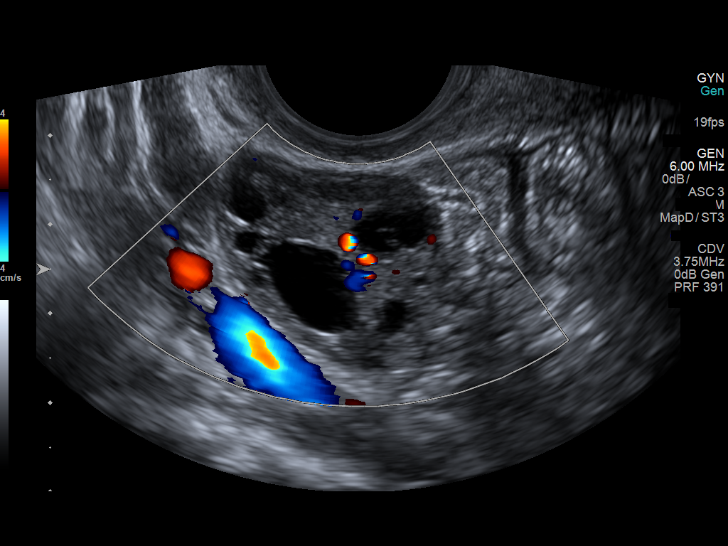
[im 77/84]
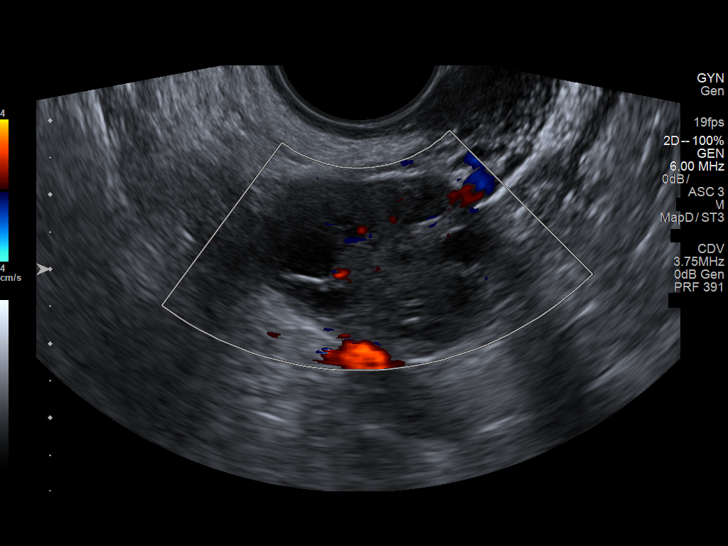
[im 84/84]
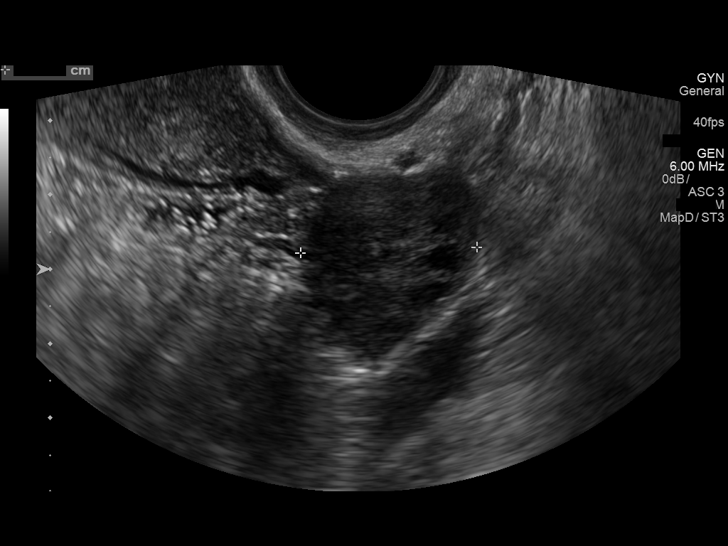

[13 of 25 positions shown; findings below may reference images not displayed]

FINDINGS: Uterus

Measurements: 7.3 x 3.4 x 5.4 cm. No fibroids or other mass
visualized.

Endometrium

Thickness: 2 mm.  No focal abnormality visualized.

Right ovary

Measurements: 3.4 x 2.8 x 2.4 cm. Several right ovarian follicles
noted. Small simple appearing right ovarian anechoic cyst measures
1.8 x 1.0 x 0.9 cm. no other significant right ovarian or adnexal
abnormality.

Left ovary

Measurements: 3.5 x 2.7 x 2.4 cm. Normal appearance. No adnexal
mass. Several left ovarian follicles noted.

Pulsed Doppler evaluation of both ovaries demonstrates normal
low-resistance arterial and venous waveforms.

Other findings

Small amount of right pelvic fluid, likely physiologic.
IMPRESSION: No acute finding by pelvic ultrasound.

1.8 cm simple appearing right ovarian cyst.

No evidence of torsion.

Trace pelvic free fluid likely physiologic

## 2016-10-25 ENCOUNTER — Encounter (HOSPITAL_COMMUNITY): Payer: Self-pay | Admitting: *Deleted

## 2016-10-25 ENCOUNTER — Emergency Department (HOSPITAL_COMMUNITY)
Admission: EM | Admit: 2016-10-25 | Discharge: 2016-10-25 | Disposition: A | Discharge status: Home / Self Care | Payer: BLUE CROSS/BLUE SHIELD | Attending: Emergency Medicine | Admitting: Emergency Medicine

## 2016-10-25 ENCOUNTER — Emergency Department (HOSPITAL_COMMUNITY): Payer: BLUE CROSS/BLUE SHIELD

## 2016-10-25 DIAGNOSIS — R1030 Lower abdominal pain, unspecified: Secondary | ICD-10-CM

## 2016-10-25 DIAGNOSIS — R1031 Right lower quadrant pain: Secondary | ICD-10-CM | POA: Diagnosis not present

## 2016-10-25 DIAGNOSIS — Z79899 Other long term (current) drug therapy: Secondary | ICD-10-CM | POA: Diagnosis not present

## 2016-10-25 DIAGNOSIS — J45909 Unspecified asthma, uncomplicated: Secondary | ICD-10-CM | POA: Insufficient documentation

## 2016-10-25 LAB — URINALYSIS, ROUTINE W REFLEX MICROSCOPIC
Bilirubin Urine: NEGATIVE
Glucose, UA: NEGATIVE mg/dL
Hgb urine dipstick: NEGATIVE
Ketones, ur: NEGATIVE mg/dL
Leukocytes, UA: NEGATIVE
Nitrite: NEGATIVE
Protein, ur: NEGATIVE mg/dL
Specific Gravity, Urine: 1.011 (ref 1.005–1.030)
pH: 6 (ref 5.0–8.0)

## 2016-10-25 LAB — I-STAT BETA HCG BLOOD, ED (MC, WL, AP ONLY): I-stat hCG, quantitative: <5 m[IU]/mL (ref <5)

## 2016-10-25 LAB — COMPREHENSIVE METABOLIC PANEL
ALK PHOS: 37 U/L — AB (ref 38–126)
ALT: 9 U/L — AB (ref 14–54)
AST: 20 U/L (ref 15–41)
Albumin: 4.2 g/dL (ref 3.5–5.0)
Anion gap: 7 (ref 5–15)
BUN: 8 mg/dL (ref 6–20)
CALCIUM: 9.2 mg/dL (ref 8.9–10.3)
CO2: 27 mmol/L (ref 22–32)
CREATININE: 0.8 mg/dL (ref 0.44–1.00)
Chloride: 107 mmol/L (ref 101–111)
GFR calc non Af Amer: >60 mL/min (ref >60)
GLUCOSE: 89 mg/dL (ref 65–99)
Potassium: 4.4 mmol/L (ref 3.5–5.1)
SODIUM: 141 mmol/L (ref 135–145)
Total Bilirubin: 0.4 mg/dL (ref 0.3–1.2)
Total Protein: 7.1 g/dL (ref 6.5–8.1)

## 2016-10-25 LAB — CBC
HCT: 42.5 % (ref 36.0–46.0)
Hemoglobin: 14.3 g/dL (ref 12.0–15.0)
MCH: 31 pg (ref 26.0–34.0)
MCHC: 33.6 g/dL (ref 30.0–36.0)
MCV: 92.2 fL (ref 78.0–100.0)
PLATELETS: 222 10*3/uL (ref 150–400)
RBC: 4.61 MIL/uL (ref 3.87–5.11)
RDW: 12.2 % (ref 11.5–15.5)
WBC: 8.6 10*3/uL (ref 4.0–10.5)

## 2016-10-25 LAB — LIPASE, BLOOD: Lipase: 23 U/L (ref 11–51)

## 2016-10-25 LAB — WET PREP, GENITAL
Clue Cells Wet Prep HPF POC: NONE SEEN
Sperm: NONE SEEN
Trich, Wet Prep: NONE SEEN
Yeast Wet Prep HPF POC: NONE SEEN

## 2016-10-25 MED ORDER — ONDANSETRON HCL 4 MG/2ML IJ SOLN
4.0000 mg | Freq: Once | INTRAMUSCULAR | Status: AC
  Administered 2016-10-25: 4 mg via INTRAVENOUS
  Filled 2016-10-25: qty 2

## 2016-10-25 MED ORDER — KETOROLAC TROMETHAMINE 30 MG/ML IJ SOLN
15.0000 mg | Freq: Once | INTRAMUSCULAR | Status: AC
  Administered 2016-10-25: 15 mg via INTRAVENOUS
  Filled 2016-10-25: qty 1

## 2016-10-25 MED ORDER — HYDROMORPHONE HCL 2 MG/ML IJ SOLN
0.5000 mg | Freq: Once | INTRAMUSCULAR | Status: AC
  Administered 2016-10-25: 0.5 mg via INTRAVENOUS
  Filled 2016-10-25: qty 1

## 2016-10-25 MED ORDER — HALOPERIDOL LACTATE 5 MG/ML IJ SOLN
2.0000 mg | Freq: Once | INTRAMUSCULAR | Status: AC
  Administered 2016-10-25: 2 mg via INTRAVENOUS
  Filled 2016-10-25: qty 1

## 2016-10-25 MED ORDER — FENTANYL CITRATE (PF) 100 MCG/2ML IJ SOLN
100.0000 ug | Freq: Once | INTRAMUSCULAR | Status: AC
  Administered 2016-10-25: 100 ug via INTRAVENOUS
  Filled 2016-10-25: qty 2

## 2016-10-25 NOTE — ED Provider Notes (Signed)
[facility]-EMERGENCY DEPT Provider Note   CSN: [id number] Arrival date & time: 10/25/16  1133     History   Chief Complaint Chief Complaint  Patient presents with  . Abdominal Pain  . Emesis    HPI Abigail Harding is a 25 y.o. female.  HPI Patient presents with right-sided and lower abdominal pain. Is also had nausea and vomiting. Began just over a week ago. States she was seen by her primary care doctor. She's had a pelvic exam blood work and an ultrasound and CT. Records reviewed and she had no white count. Right upper quadrant ultrasound did not show a cause. Pelvic ultrasound done and was not able to visualize the right ovary otherwise is reassuring. CT scan also reassuring. Continues to have pain. Continued nausea vomiting. States she's had some mild white vaginal discharge. Patient states that the testing from the pelvic exam is not back yet. The pain has been constant. Has had some chills. No dysuria. Pain is on her whole right side or abdomen. Patient was treated with doxycycline and Rocephin for PID.   Past Medical History:  Diagnosis Date  . Asthma   . Back pain     There are no active problems to display for this patient.   History reviewed. No pertinent surgical history.  OB History    No data available       Home Medications    Prior to Admission medications   Medication Sig Start Date End Date Taking? Authorizing Provider  albuterol (PROVENTIL HFA;VENTOLIN HFA) 108 (90 BASE) MCG/ACT inhaler Inhale 2 puffs into the lungs every 6 (six) hours as needed.   Yes Historical Provider, MD  doxycycline (VIBRAMYCIN) 100 MG capsule Take 100 mg by mouth 2 (two) times daily.   Yes Historical Provider, MD  gabapentin (NEURONTIN) 300 MG capsule Take 300 mg by mouth 3 (three) times daily.   Yes Historical Provider, MD  Norethindrone-Ethinyl Estradiol-Fe Biphas (LO LOESTRIN FE) 1 MG-10 MCG / 10 MCG tablet Take 1 tablet by mouth daily. 07/16/16  Yes Historical Provider, MD    omeprazole (PRILOSEC) 40 MG capsule Take 40 mg by mouth daily.   Yes Historical Provider, MD  ondansetron (ZOFRAN-ODT) 8 MG disintegrating tablet Take 8 mg by mouth 3 (three) times daily as needed for nausea or vomiting.   Yes Historical Provider, MD  promethazine (PHENERGAN) 25 MG tablet Take 1 tablet (25 mg total) by mouth every 8 (eight) hours as needed for nausea or vomiting. Patient taking differently: Take 25 mg by mouth 3 (three) times daily.  12/22/14  Yes Christopher Lawyer, PA-C  sertraline (ZOLOFT) 25 MG tablet Take 25 mg by mouth daily.   Yes Historical Provider, MD  HYDROcodone-acetaminophen (NORCO/VICODIN) 5-325 MG per tablet Take 1 tablet by mouth every 6 (six) hours as needed for moderate pain. Patient not taking: Reported on 10/25/2016 12/22/14   Charlestine Nighthristopher Lawyer, PA-C  ibuprofen (ADVIL,MOTRIN) 800 MG tablet Take 1 tablet (800 mg total) by mouth every 8 (eight) hours as needed. Patient not taking: Reported on 10/25/2016 12/22/14   Charlestine Nighthristopher Lawyer, PA-C    Family History History reviewed. No pertinent family history.  Social History Social History  Substance Use Topics  . Smoking status: Never Smoker  . Smokeless tobacco: Not on file  . Alcohol use No     Allergies   Morphine and related and Tramadol   Review of Systems Review of Systems  Constitutional: Positive for appetite change.  HENT: Negative for congestion.   Respiratory:  Negative for shortness of breath.   Cardiovascular: Negative for chest pain.  Gastrointestinal: Positive for abdominal pain, nausea and vomiting.  Genitourinary: Positive for vaginal discharge. Negative for vaginal bleeding and vaginal pain.  Neurological: Negative for headaches.  Hematological: Negative for adenopathy.  Psychiatric/Behavioral: Negative for agitation.     Physical Exam Updated Vital Signs BP 116/66   Pulse 80   Temp 97.7 F (36.5 C) (Oral)   Resp 20   Ht 5\' 4"  (1.626 m)   Wt 140 lb (63.5 kg)   LMP 10/05/2016    SpO2 96%   BMI 24.03 kg/m   Physical Exam  Constitutional: She appears well-developed.  HENT:  Head: Atraumatic.  Neck: Neck supple.  Cardiovascular: Normal rate.   Pulmonary/Chest: Effort normal.  Abdominal: There is tenderness.  Right upper mid and lower abdominal tenderness. No hernias. No rash.  Musculoskeletal: She exhibits no edema.  Neurological: She is alert.  Skin: Skin is warm. Capillary refill takes less than 2 seconds.  Psychiatric: She has a normal mood and affect.  Pelvic exam showed light erythema at the cervical os. Some mild cervical motion tenderness and right adnexal tenderness.   ED Treatments / Results  Labs (all labs ordered are listed, but only abnormal results are displayed) Labs Reviewed  COMPREHENSIVE METABOLIC PANEL - Abnormal; Notable for the following:       Result Value   ALT 9 (*)    Alkaline Phosphatase 37 (*)    All other components within normal limits  URINALYSIS, ROUTINE W REFLEX MICROSCOPIC - Abnormal; Notable for the following:    Color, Urine STRAW (*)    All other components within normal limits  WET PREP, GENITAL  LIPASE, BLOOD  CBC  RPR  HIV ANTIBODY (ROUTINE TESTING)  I-STAT BETA HCG BLOOD, ED ([facility], [facility], [facility] ONLY)  GC/CHLAMYDIA PROBE AMP ([place]) NOT AT [facility]    EKG  EKG Interpretation None       Radiology US Transvaginal Non-ob  Result Date: 10/25/2016 CLINICAL DATA:  Bilateral pelvic pain for past week, symptoms worse today. EXAM: TRANSABDOMINAL AND TRANSVAGINAL ULTRASOUND OF PELVIS DOPPLER ULTRASOUND OF OVARIES TECHNIQUE: Both transabdominal and transvaginal ultrasound examinations of the pelvis were performed. Transabdominal technique was performed for global imaging of the pelvis including uterus, ovaries, adnexal regions, and pelvic cul-de-sac. It was necessary to proceed with endovaginal exam following the transabdominal exam to visualize the uterus, endometrium, ovaries, and adnexal structures. Color and duplex  Doppler ultrasound was utilized to evaluate blood flow to the ovaries. COMPARISON:  abdominopelvic CT scan of December 22, 2014 FINDINGS: Uterus Measurements: 6.4 x 3.4 x 5.1 cm. No fibroids or other mass visualized. Endometrium Thickness: 4.6 mm.  No focal abnormality visualized. Right ovary Measurements: 3.1 x 3.3 x 2.5 cm. There is a simple appearing right ovarian cyst measuring 1.8 x 1.2 x 1.0 cm. There are multiple developing follicles. Left ovary Measurements: 3.4 x 2.6 x 2.7 cm. There are multiple developing follicles. Pulsed Doppler evaluation of both ovaries demonstrates normal low-resistance arterial and venous waveforms. Other findings There is a trace of free pelvic fluid. IMPRESSION: No evidence of ovarian torsion. Dominant follicle versus small simple cyst in the right ovary measuring 1.8 x 1.2 x 1 cm. Normal appearance of the uterus and endometrium. Trace of free pelvic fluid. Electronically Signed   By: David  [place] M.D.   On: 10/25/2016 14:44   US Pelvis Complete  Result Date: 10/25/2016 CLINICAL DATA:  Bilateral pelvic pain for past week, symptoms  worse today. EXAM: TRANSABDOMINAL AND TRANSVAGINAL ULTRASOUND OF PELVIS DOPPLER ULTRASOUND OF OVARIES TECHNIQUE: Both transabdominal and transvaginal ultrasound examinations of the pelvis were performed. Transabdominal technique was performed for global imaging of the pelvis including uterus, ovaries, adnexal regions, and pelvic cul-de-sac. It was necessary to proceed with endovaginal exam following the transabdominal exam to visualize the uterus, endometrium, ovaries, and adnexal structures. Color and duplex Doppler ultrasound was utilized to evaluate blood flow to the ovaries. COMPARISON:  abdominopelvic CT scan of December 22, 2014 FINDINGS: Uterus Measurements: 6.4 x 3.4 x 5.1 cm. No fibroids or other mass visualized. Endometrium Thickness: 4.6 mm.  No focal abnormality visualized. Right ovary Measurements: 3.1 x 3.3 x 2.5 cm. There is a simple  appearing right ovarian cyst measuring 1.8 x 1.2 x 1.0 cm. There are multiple developing follicles. Left ovary Measurements: 3.4 x 2.6 x 2.7 cm. There are multiple developing follicles. Pulsed Doppler evaluation of both ovaries demonstrates normal low-resistance arterial and venous waveforms. Other findings There is a trace of free pelvic fluid. IMPRESSION: No evidence of ovarian torsion. Dominant follicle versus small simple cyst in the right ovary measuring 1.8 x 1.2 x 1 cm. Normal appearance of the uterus and endometrium. Trace of free pelvic fluid. Electronically Signed   By: David  [place] M.D.   On: 10/25/2016 14:44   [place] Art/ven Flow Abd Pelv Doppler  Result Date: 10/25/2016 CLINICAL DATA:  Bilateral pelvic pain for past week, symptoms worse today. EXAM: TRANSABDOMINAL AND TRANSVAGINAL ULTRASOUND OF PELVIS DOPPLER ULTRASOUND OF OVARIES TECHNIQUE: Both transabdominal and transvaginal ultrasound examinations of the pelvis were performed. Transabdominal technique was performed for global imaging of the pelvis including uterus, ovaries, adnexal regions, and pelvic cul-de-sac. It was necessary to proceed with endovaginal exam following the transabdominal exam to visualize the uterus, endometrium, ovaries, and adnexal structures. Color and duplex Doppler ultrasound was utilized to evaluate blood flow to the ovaries. COMPARISON:  abdominopelvic CT scan of December 22, 2014 FINDINGS: Uterus Measurements: 6.4 x 3.4 x 5.1 cm. No fibroids or other mass visualized. Endometrium Thickness: 4.6 mm.  No focal abnormality visualized. Right ovary Measurements: 3.1 x 3.3 x 2.5 cm. There is a simple appearing right ovarian cyst measuring 1.8 x 1.2 x 1.0 cm. There are multiple developing follicles. Left ovary Measurements: 3.4 x 2.6 x 2.7 cm. There are multiple developing follicles. Pulsed Doppler evaluation of both ovaries demonstrates normal low-resistance arterial and venous waveforms. Other findings There is a trace of free  pelvic fluid. IMPRESSION: No evidence of ovarian torsion. Dominant follicle versus small simple cyst in the right ovary measuring 1.8 x 1.2 x 1 cm. Normal appearance of the uterus and endometrium. Trace of free pelvic fluid. Electronically Signed   By: David  [place] M.D.   On: 10/25/2016 14:44    Procedures Procedures (including critical care time)  Medications Ordered in ED Medications  ketorolac (TORADOL) 30 MG/ML injection 15 mg (not administered)  haloperidol lactate (HALDOL) injection 2 mg (not administered)  ondansetron (ZOFRAN) injection 4 mg (4 mg Intravenous Given 10/25/16 1341)  fentaNYL (SUBLIMAZE) injection 100 mcg (100 mcg Intravenous Given 10/25/16 1341)  fentaNYL (SUBLIMAZE) injection 100 mcg (100 mcg Intravenous Given 10/25/16 1445)     Initial Impression / Assessment and Plan / ED Course  I have reviewed the triage vital signs and the nursing notes.  Pertinent labs & imaging results that were available during my care of the patient were reviewed by me and considered in my medical decision making (see chart for  details).  Clinical Course     Patient with right sided abdominal pain. She has had the pain for the last year, but more severe In the last week with nausea and vomiting. Lab work reassuring here reviewed from one week ago. Ultrasound done and did not show clear cause. Negative CT 1 week ago. No clear cause found. Will add Toradol and Haldol here and see if it will help. Likely will be able to be discharged home.  Final Clinical Impressions(s) / ED Diagnoses   Final diagnoses:  Lower abdominal pain    New Prescriptions New Prescriptions   No medications on file     Benjiman Core, MD 10/25/16 1542

## 2016-10-25 NOTE — ED Notes (Signed)
ED Provider at bedside. 

## 2016-10-25 NOTE — ED Triage Notes (Signed)
Pt reports ongoing abd pain, bloating, n/v, lower back pain, vaginal discharge. Has been seen at [facility] and reports having US and Ct scan done, sent here to see a surgeon.

## 2016-10-25 NOTE — ED Notes (Signed)
Patient transported to Ultrasound 

## 2016-10-25 NOTE — ED Notes (Signed)
Pt stable after medications, pt helped to get dressed and taken out to vehicle and taken home by mother. Pt verbalized understanding of dc instructions.

## 2016-10-26 LAB — GC/CHLAMYDIA PROBE AMP (~~LOC~~) NOT AT ARMC
CHLAMYDIA, DNA PROBE: NEGATIVE
Neisseria Gonorrhea: NEGATIVE

## 2016-10-26 LAB — HIV ANTIBODY (ROUTINE TESTING W REFLEX): HIV Screen 4th Generation wRfx: NONREACTIVE

## 2016-10-26 LAB — RPR: RPR Ser Ql: NONREACTIVE

## 2016-10-31 ENCOUNTER — Encounter: Payer: Self-pay | Admitting: Obstetrics and Gynecology

## 2016-10-31 ENCOUNTER — Ambulatory Visit (INDEPENDENT_AMBULATORY_CARE_PROVIDER_SITE_OTHER): Payer: BLUE CROSS/BLUE SHIELD | Admitting: Obstetrics and Gynecology

## 2016-10-31 VITALS — BP 137/80 | HR 91 | Ht 64.0 in | Wt 140.0 lb

## 2016-10-31 DIAGNOSIS — N941 Unspecified dyspareunia: Secondary | ICD-10-CM

## 2016-10-31 DIAGNOSIS — N946 Dysmenorrhea, unspecified: Secondary | ICD-10-CM

## 2016-10-31 DIAGNOSIS — R1031 Right lower quadrant pain: Secondary | ICD-10-CM

## 2016-10-31 DIAGNOSIS — Z842 Family history of other diseases of the genitourinary system: Secondary | ICD-10-CM | POA: Diagnosis not present

## 2016-10-31 DIAGNOSIS — R102 Pelvic and perineal pain: Secondary | ICD-10-CM | POA: Diagnosis not present

## 2016-10-31 DIAGNOSIS — N949 Unspecified condition associated with female genital organs and menstrual cycle: Secondary | ICD-10-CM | POA: Insufficient documentation

## 2016-10-31 NOTE — Progress Notes (Signed)
GYN ENCOUNTER NOTE  Subjective:       Abigail Harding is a 25 y.o. G0P0000 female is here for gynecologic evaluation of the following issues:  1. Chronic abdominal/pelvic pain  Menarche: Age 25 Intervals: Infrequent, irregular, less than 10 per year (oligomenorrhea) Duration: 10 days Dysmenorrhea: Severe; 10/10 in intensity; pain is central cramping along with low back pain with radiation into the buttocks Tylenol is ineffective as our nonsteroidals. She has used and occasional narcotic in the past with help. Dyspareunia: Deep thrusting present  11/17/2015 surgery included laparoscopy for ruptured ovarian cyst; no diagnosis of endometriosis was made  10/25/2016 ultrasound revealed 1.8 cm simple cyst, otherwise normal  Bowel function is normal. Bladder function is normal.    Gynecologic History Patient's last menstrual period was 10/05/2016. Contraception: OCP (estrogen/progesterone) Last Pap: N/A  Obstetric History OB History  Gravida Para Term Preterm AB Living  0 0 0 0 0 0  SAB TAB Ectopic Multiple Live Births  0 0 0 0 0        Past Medical History:  Diagnosis Date  . Anxiety   . Asthma   . Back pain   . Ovarian cyst     Past Surgical History:  Procedure Laterality Date  . LAPAROSCOPY    . TONSILLECTOMY      Current Outpatient Prescriptions on File Prior to Visit  Medication Sig Dispense Refill  . doxycycline (VIBRAMYCIN) 100 MG capsule Take 100 mg by mouth 2 (two) times daily.    Marland Kitchen. gabapentin (NEURONTIN) 300 MG capsule Take 300 mg by mouth 3 (three) times daily.    . Norethindrone-Ethinyl Estradiol-Fe Biphas (LO LOESTRIN FE) 1 MG-10 MCG / 10 MCG tablet Take 1 tablet by mouth daily.    Marland Kitchen. omeprazole (PRILOSEC) 40 MG capsule Take 40 mg by mouth daily.    . ondansetron (ZOFRAN-ODT) 8 MG disintegrating tablet Take 8 mg by mouth 3 (three) times daily as needed for nausea or vomiting.    . promethazine (PHENERGAN) 25 MG tablet Take 1 tablet (25 mg total) by mouth  every 8 (eight) hours as needed for nausea or vomiting. (Patient taking differently: Take 25 mg by mouth 3 (three) times daily. ) 15 tablet 0  . sertraline (ZOLOFT) 25 MG tablet Take 25 mg by mouth daily.     No current facility-administered medications on file prior to visit.     Allergies  Allergen Reactions  . Tramadol Nausea And Vomiting  . Morphine And Related Itching  . Hydrocodone-Acetaminophen Rash    Social History   Social History  . Marital status: Single    Spouse name: N/A  . Number of children: N/A  . Years of education: N/A   Occupational History  . Not on file.   Social History Main Topics  . Smoking status: Never Smoker  . Smokeless tobacco: Former NeurosurgeonUser  . Alcohol use Yes     Comment: RARE  . Drug use:     Types: Marijuana     Comment: DAILY- X 4 YEARS  . Sexual activity: Yes    Birth control/ protection: Pill   Other Topics Concern  . Not on file   Social History Narrative  . No narrative on file    Family History  Problem Relation Age of Onset  . Diabetes Mother   . Diabetes Maternal Grandmother   . Heart disease Maternal Grandmother     GRT ALSO  . Breast cancer Paternal Grandmother     GRT  .  Ovarian cancer Neg Hx   . Colon cancer Neg Hx     The following portions of the patient's history were reviewed and updated as appropriate: allergies, current medications, past family history, past medical history, past social history, past surgical history and problem list.  Review of Systems Review of Systems -comprehensive review of systems is negative except for that noted in the history of present illness Objective:   BP 137/80   Pulse 91   Ht 5\' 4"  (1.626 m)   Wt 140 lb (63.5 kg)   LMP 10/05/2016   BMI 24.03 kg/m  CONSTITUTIONAL: Well-developed, well-nourished female in no acute distress.  HENT:  Normocephalic, atraumatic.  NECK: Normal range of motion, supple, no masses.  Normal thyroid.  SKIN: Skin is warm and dry. No rash noted.  Not diaphoretic. No erythema. No pallor. NEUROLGIC: Alert and oriented to person, place, and time. PSYCHIATRIC: Normal mood and affect. Normal behavior. Normal judgment and thought content. CARDIOVASCULAR:Not Examined RESPIRATORY: Not Examined BREASTS: Not Examined ABDOMEN: Soft, non distended; Non tender.  No Organomegaly. PELVIC:  External Genitalia: Normal  BUS: Normal  Vagina: Normal  Cervix: no lesions; mild cervical motion tenderness  Uterus: Normal size, shape,consistency, mobile, 2/4 tender  Adnexa:nonpalpable, mild tenderness right greater than left  RV: Normal external exam  Bladder: Nontender MUSCULOSKELETAL: Normal range of motion. No tenderness.  No cyanosis, clubbing, or edema.     Assessment:   1. Pelvic pain - Urine culture  2. Dysmenorrhea  3. Female dyspareunia  4. Family history of endometriosis in first degree relative  5. Right lower quadrant abdominal pain     Plan:   1. Continue with oral contraceptives 2. Recommend high-dose ibuprofen 800 mg 3 times a day and Tylenol 2 tablets every 6 hours as needed 3. Laparoscopy with peritoneal biopsies to rule out endometriosis is scheduled 4. Return for preop appointment week before surgery  A total of 30 minutes were spent face-to-face with the patient during the encounter with greater than 50% dealing with counseling and coordination of care.  Herold HarmsMartin A Neah Sporrer, MD  Note: This dictation was prepared with Dragon dictation along with smaller phrase technology. Any transcriptional errors that result from this process are unintentional.

## 2016-10-31 NOTE — Patient Instructions (Addendum)
1. laparoscopy with peritoneal biopsies as scheduled 2. Return in 1 week before surgery for preoperative appointment 3. Recommend ibuprofen 800 mg 3 times a day for pain relief, along with Tylenol extra strength 2 tablets 4 times a day     Endometriosis Endometriosis is a condition in which the tissue that lines the uterus (endometrium) grows outside of its normal location. The tissue may grow in many locations close to the uterus, but it commonly grows on the ovaries, fallopian tubes, vagina, or bowel. When the uterus sheds the endometrium every menstrual cycle, there is bleeding wherever the endometrial tissue is located. This can cause pain because blood is irritating to tissues that are not normally exposed to it. What are the causes? The cause of endometriosis is not known. What increases the risk? You may be more likely to develop endometriosis if you:  Have a family history of endometriosis.  Have never given birth.  Started your period at age 23 or younger.  Have high levels of estrogen in your body.  Were exposed to a certain medicine (diethylstilbestrol) before you were born (in utero).  Had low birth weight.  Were born as a twin, triplet, or other multiple.  Have a BMI of less than 25. BMI is an estimate of body fat and is calculated from height and weight. What are the signs or symptoms? Often, there are no symptoms of this condition. If you do have symptoms, they may:  Vary depending on where your endometrial tissue is growing.  Occur during your menstrual period (most common) or midcycle.  Come and go, or you may go months with no symptoms at all.  Stop with menopause. Symptoms may include:  Pain in the back or abdomen.  Heavier bleeding during periods.  Pain during sex.  Painful bowel movements.  Infertility.  Pelvic pain.  Bleeding more than once a month. How is this diagnosed? This condition is diagnosed based on your symptoms and a physical  exam. You may have tests, such as:  Blood tests and urine tests. These may be done to help rule out other possible causes of your symptoms.  Ultrasound, to look for abnormal tissues.  An X-ray of the lower bowel (barium enema).  An ultrasound that is done through the vagina (transvaginally).  CT scan.  MRI.  Laparoscopy. In this procedure, a lighted, pencil-sized instrument called a laparoscope is inserted into your abdomen through an incision. The laparoscope allows your health care provider to look at the organs inside your body and check for abnormal tissue to confirm the diagnosis. If abnormal tissue is found, your health care provider may remove a small piece of tissue (biopsy) to be examined under a microscope. How is this treated? Treatment for this condition may include:  Medicines to relieve pain, such as NSAIDs.  Hormone therapy. This involves using artificial (synthetic) hormones to reduce endometrial tissue growth. Your health care provider may recommend using a hormonal form of birth control, or other medicines.  Surgery. This may be done to remove abnormal endometrial tissue.  In some cases, tissue may be removed using a laparoscope and a laser (laparoscopic laser treatment).  In severe cases, surgery may be done to remove the fallopian tubes, uterus, and ovaries (hysterectomy). Follow these instructions at home:  Take over-the-counter and prescription medicines only as told by your health care provider.  Do not drive or use heavy machinery while taking prescription pain medicine.  Try to avoid activities that cause pain, including sexual activity.  Keep all follow-up visits as told by your health care provider. This is important. Contact a health care provider if:  You have pain in the area between your hip bones (pelvic area) that occurs:  Before, during, or after your period.  In between your period and gets worse during your period.  During or after  sex.  With bowel movements or urination, especially during your period.  You have problems getting pregnant.  You have a fever. Get help right away if:  You have severe pain that does not get better with medicine.  You have severe nausea and vomiting, or you cannot eat without vomiting.  You have pain that affects only the lower, right side of your abdomen.  You have abdominal pain that gets worse.  You have abdominal swelling.  You have blood in your stool. This information is not intended to replace advice given to you by your health care provider. Make sure you discuss any questions you have with your health care provider. Document Released: 10/27/2000 Document Revised: 08/04/2016 Document Reviewed: 04/01/2016 Elsevier Interactive Patient Education  2017 [employer] Diagnostic Laparoscopy A diagnostic laparoscopy is a procedure to diagnose diseases in the abdomen. During the procedure, a thin, lighted, pencil-sized instrument called a laparoscope is inserted into the abdomen through an incision. The laparoscope allows your health care provider to look at the organs inside your body. Tell a health care provider about:  Any allergies you have.  All medicines you are taking, including vitamins, herbs, eye drops, creams, and over-the-counter medicines.  Any problems you or family members have had with anesthetic medicines.  Any blood disorders you have.  Any surgeries you have had.  Any medical conditions you have. What are the risks? Generally, this is a safe procedure. However, problems can occur, which may include:  Infection.  Bleeding.  Damage to other organs.  Allergic reaction to the anesthetics used during the procedure. What happens before the procedure?  Do not eat or drink anything after midnight on the night before the procedure or as directed by your health care provider.  Ask your health care provider about:  Changing or stopping your regular  medicines.  Taking medicines such as aspirin and ibuprofen. These medicines can thin your blood. Do not take these medicines before your procedure if your health care provider instructs you not to.  Plan to have someone take you home after the procedure. What happens during the procedure?  You may be given a medicine to help you relax (sedative).  You will be given a medicine to make you sleep (general anesthetic).  Your abdomen will be inflated with a gas. This will make your organs easier to see.  Small incisions will be made in your abdomen.  A laparoscope and other small instruments will be inserted into the abdomen through the incisions.  A tissue sample may be removed from an organ in the abdomen for examination.  The instruments will be removed from the abdomen.  The gas will be released.  The incisions will be closed with stitches (sutures). What happens after the procedure? Your blood pressure, heart rate, breathing rate, and blood oxygen level will be monitored often until the medicines you were given have worn off. This information is not intended to replace advice given to you by your health care provider. Make sure you discuss any questions you have with your health care provider. Document Released: 02/05/2001 Document Revised: 03/09/2016 Document Reviewed: 06/12/2014 Elsevier Interactive Patient Education  2017 [employer]  

## 2016-11-01 ENCOUNTER — Encounter: Payer: Self-pay | Admitting: Obstetrics and Gynecology

## 2016-11-02 LAB — URINE CULTURE: Organism ID, Bacteria: NO GROWTH

## 2016-11-15 ENCOUNTER — Telehealth: Payer: Self-pay | Admitting: Obstetrics and Gynecology

## 2016-11-15 MED ORDER — PROMETHAZINE HCL 25 MG PO TABS: 25.0000 mg | ORAL_TABLET | Freq: Three times a day (TID) | ORAL | 0 refills | Status: DC | PRN

## 2016-11-15 NOTE — Telephone Encounter (Signed)
Pt is scheduled for dx lap w/bx on 1/22 for pp and dyspareunia. She has tried to take ibup 800 tid with no relief. She did not take tylenol. She is taking her ocp and nausea meds only. Will refill phenergan. Pt aware will have to send a message to mad for pain meds.

## 2016-11-15 NOTE — Telephone Encounter (Signed)
This pt is coming until 1/16, surgery not till 1/22. She needs pain medication and a DR. Note for out of work until after surgery.

## 2016-11-15 NOTE — Addendum Note (Signed)
Addended by: Marchelle FolksMILLER, Janaisha Tolsma G on: 11/15/2016 03:47 PM   Modules accepted: Orders

## 2016-11-16 MED ORDER — OXYCODONE-ACETAMINOPHEN 5-325 MG PO TABS: 1.0000 | ORAL_TABLET | Freq: Four times a day (QID) | ORAL | 0 refills | Status: DC | PRN

## 2016-11-16 NOTE — Telephone Encounter (Signed)
Pt aware percocet rx left up front for p/u. Pt states Mom(Donna) will p/u rx. Pt does have a reaction(rash) to Vicodin but ok to take percocet.

## 2016-11-16 NOTE — Addendum Note (Signed)
Addended by: Marchelle FolksMILLER, Estha Few G on: 11/16/2016 11:00 AM   Modules accepted: Orders

## 2016-11-18 ENCOUNTER — Emergency Department (HOSPITAL_COMMUNITY): Payer: BLUE CROSS/BLUE SHIELD

## 2016-11-18 ENCOUNTER — Emergency Department (HOSPITAL_COMMUNITY)
Admission: EM | Admit: 2016-11-18 | Discharge: 2016-11-18 | Disposition: A | Discharge status: Home / Self Care | Payer: BLUE CROSS/BLUE SHIELD | Attending: Emergency Medicine | Admitting: Emergency Medicine

## 2016-11-18 ENCOUNTER — Encounter (HOSPITAL_COMMUNITY): Payer: Self-pay | Admitting: Emergency Medicine

## 2016-11-18 DIAGNOSIS — R112 Nausea with vomiting, unspecified: Secondary | ICD-10-CM | POA: Diagnosis not present

## 2016-11-18 DIAGNOSIS — R102 Pelvic and perineal pain: Secondary | ICD-10-CM

## 2016-11-18 DIAGNOSIS — N83519 Torsion of ovary and ovarian pedicle, unspecified side: Secondary | ICD-10-CM

## 2016-11-18 DIAGNOSIS — N83511 Torsion of right ovary and ovarian pedicle: Secondary | ICD-10-CM | POA: Insufficient documentation

## 2016-11-18 DIAGNOSIS — J45909 Unspecified asthma, uncomplicated: Secondary | ICD-10-CM | POA: Insufficient documentation

## 2016-11-18 LAB — COMPREHENSIVE METABOLIC PANEL
ALK PHOS: 40 U/L (ref 38–126)
ALT: 11 U/L — AB (ref 14–54)
AST: 21 U/L (ref 15–41)
Albumin: 4.9 g/dL (ref 3.5–5.0)
Anion gap: 11 (ref 5–15)
BUN: 8 mg/dL (ref 6–20)
CALCIUM: 9.5 mg/dL (ref 8.9–10.3)
CHLORIDE: 102 mmol/L (ref 101–111)
CO2: 27 mmol/L (ref 22–32)
CREATININE: 0.57 mg/dL (ref 0.44–1.00)
GFR calc non Af Amer: >60 mL/min (ref >60)
Glucose, Bld: 119 mg/dL — ABNORMAL HIGH (ref 65–99)
Potassium: 3.6 mmol/L (ref 3.5–5.1)
SODIUM: 140 mmol/L (ref 135–145)
Total Bilirubin: 0.4 mg/dL (ref 0.3–1.2)
Total Protein: 8.5 g/dL — ABNORMAL HIGH (ref 6.5–8.1)

## 2016-11-18 LAB — PREGNANCY, URINE: PREG TEST UR: NEGATIVE

## 2016-11-18 LAB — CBC
HCT: 43.5 % (ref 36.0–46.0)
Hemoglobin: 14.9 g/dL (ref 12.0–15.0)
MCH: 30.8 pg (ref 26.0–34.0)
MCHC: 34.3 g/dL (ref 30.0–36.0)
MCV: 90.1 fL (ref 78.0–100.0)
PLATELETS: 246 10*3/uL (ref 150–400)
RBC: 4.83 MIL/uL (ref 3.87–5.11)
RDW: 11.9 % (ref 11.5–15.5)
WBC: 8.5 10*3/uL (ref 4.0–10.5)

## 2016-11-18 LAB — URINALYSIS, MICROSCOPIC (REFLEX)
RBC / HPF: NONE SEEN RBC/hpf (ref 0–5)
WBC UA: NONE SEEN WBC/hpf (ref 0–5)

## 2016-11-18 LAB — URINALYSIS, ROUTINE W REFLEX MICROSCOPIC
Bilirubin Urine: NEGATIVE
GLUCOSE, UA: NEGATIVE mg/dL
Hgb urine dipstick: NEGATIVE
Ketones, ur: NEGATIVE mg/dL
Leukocytes, UA: NEGATIVE
Nitrite: NEGATIVE
PH: 8.5 — AB (ref 5.0–8.0)
PROTEIN: 30 mg/dL — AB
Specific Gravity, Urine: 1.015 (ref 1.005–1.030)

## 2016-11-18 LAB — WET PREP, GENITAL
CLUE CELLS WET PREP: NONE SEEN
Sperm: NONE SEEN
Trich, Wet Prep: NONE SEEN
Yeast Wet Prep HPF POC: NONE SEEN

## 2016-11-18 LAB — LIPASE, BLOOD: LIPASE: 19 U/L (ref 11–51)

## 2016-11-18 MED ORDER — ONDANSETRON HCL 4 MG/2ML IJ SOLN
4.0000 mg | Freq: Once | INTRAMUSCULAR | Status: DC
  Filled 2016-11-18: qty 2

## 2016-11-18 MED ORDER — HYDROMORPHONE HCL 1 MG/ML IJ SOLN
0.5000 mg | Freq: Once | INTRAMUSCULAR | Status: AC
  Administered 2016-11-18: 0.5 mg via INTRAVENOUS
  Filled 2016-11-18: qty 1

## 2016-11-18 MED ORDER — ONDANSETRON HCL 4 MG/2ML IJ SOLN
4.0000 mg | Freq: Once | INTRAMUSCULAR | Status: AC | PRN
  Administered 2016-11-18: 4 mg via INTRAVENOUS

## 2016-11-18 MED ORDER — OXYCODONE-ACETAMINOPHEN 5-325 MG PO TABS: 1.0000 | ORAL_TABLET | Freq: Four times a day (QID) | ORAL | 0 refills | Status: DC | PRN

## 2016-11-18 MED ORDER — OXYCODONE-ACETAMINOPHEN 5-325 MG PO TABS
1.0000 | ORAL_TABLET | Freq: Once | ORAL | Status: AC
  Administered 2016-11-18: 1 via ORAL
  Filled 2016-11-18: qty 1

## 2016-11-18 MED ORDER — ONDANSETRON 8 MG PO TBDP: 8.0000 mg | ORAL_TABLET | Freq: Three times a day (TID) | ORAL | 0 refills | Status: DC | PRN

## 2016-11-18 MED ORDER — LORAZEPAM 2 MG/ML IJ SOLN
1.0000 mg | Freq: Once | INTRAMUSCULAR | Status: AC
  Administered 2016-11-18: 1 mg via INTRAVENOUS
  Filled 2016-11-18: qty 1

## 2016-11-18 MED ORDER — SODIUM CHLORIDE 0.9 % IV BOLUS (SEPSIS)
1000.0000 mL | Freq: Once | INTRAVENOUS | Status: AC
  Administered 2016-11-18: 1000 mL via INTRAVENOUS

## 2016-11-18 NOTE — ED Provider Notes (Signed)
[facility]-EMERGENCY DEPT Provider Note   CSN: [id number] Arrival date & time: 11/18/16  1201     History   Chief Complaint Chief Complaint  Patient presents with  . Abdominal Pain  . Emesis    HPI Abigail Harding is a 26 y.o. female.  26 year old Caucasian female with a past medical history significant for ovarian cyst presents to the ED today with right-sided and lower abdominal pain/pelvic pain. Patient also endorses nausea and emesis yesterday. Patient's symptoms have been ongoing and intermittenant for the past year. She has been seen on 12/13 in the ED for same. At that time she was having same pain. US revealed right simple ovarian cyst. She had CT scan performed by her PCP approx 3 weeks ago for same pain and was normal. Patient endorses white vaginal discharge that has been ongoing for the past year. She had wet prep, gc/chy and std testing that was normal. Patient was treated for PID 3 weeks ago with doxy and rocephin. Patietn is followed by OB/gyn with a possible dx for endometriosis and has surgery scheduled for 1/22 for ablation. She has bee given pain meds by ob/gyn that aren't helping. The pain is constant and sharp. She also reports dyspareunia that is baseline. She denies any fever, chills, ha, vision changes, cough, cp, sob, urinary symptoms, vaginal bleeding, change in bowel habits, parathesias. .        Past Medical History:  Diagnosis Date  . Anxiety   . Asthma   . Back pain   . Ovarian cyst     Patient Active Problem List   Diagnosis Date Noted  . Right lower quadrant abdominal pain 10/31/2016  . Family history of endometriosis in first degree relative 10/31/2016  . Female dyspareunia 10/31/2016  . Dysmenorrhea 10/31/2016  . Pelvic pain 10/31/2016  . Chronic pelvic pain in female 03/16/2016  . Generalized anxiety disorder 03/02/2016  . Oligo-ovulation 11/17/2015    Past Surgical History:  Procedure Laterality Date  . LAPAROSCOPY    . TONSILLECTOMY       OB History    Gravida Para Term Preterm AB Living   0 0 0 0 0 0   SAB TAB Ectopic Multiple Live Births   0 0 0 0 0       Home Medications    Prior to Admission medications   Medication Sig Start Date End Date Taking? Authorizing Provider  doxycycline (VIBRAMYCIN) 100 MG capsule Take 100 mg by mouth 2 (two) times daily.    Historical Provider, MD  gabapentin (NEURONTIN) 300 MG capsule Take 300 mg by mouth 3 (three) times daily.    Historical Provider, MD  Norethindrone-Ethinyl Estradiol-Fe Biphas (LO LOESTRIN FE) 1 MG-10 MCG / 10 MCG tablet Take 1 tablet by mouth daily. 07/16/16   Historical Provider, MD  omeprazole (PRILOSEC) 40 MG capsule Take 40 mg by mouth daily.    Historical Provider, MD  ondansetron (ZOFRAN-ODT) 8 MG disintegrating tablet Take 8 mg by mouth 3 (three) times daily as needed for nausea or vomiting.    Historical Provider, MD  oxyCODONE-acetaminophen (ROXICET) 5-325 MG tablet Take 1-2 tablets by mouth every 6 (six) hours as needed for severe pain. 11/16/16   Prentice Docker Defrancesco, MD  promethazine (PHENERGAN) 25 MG tablet Take 1 tablet (25 mg total) by mouth every 8 (eight) hours as needed for nausea or vomiting. 11/15/16   Prentice Docker Defrancesco, MD  sertraline (ZOLOFT) 25 MG tablet Take 25 mg by mouth daily.  Historical Provider, MD    Family History Family History  Problem Relation Age of Onset  . Diabetes Mother   . Diabetes Maternal Grandmother   . Heart disease Maternal Grandmother     GRT ALSO  . Breast cancer Paternal Grandmother     GRT  . Ovarian cancer Neg Hx   . Colon cancer Neg Hx     Social History Social History  Substance Use Topics  . Smoking status: Never Smoker  . Smokeless tobacco: Former NeurosurgeonUser  . Alcohol use Yes     Comment: RARE     Allergies   Tramadol; Morphine and related; and Hydrocodone-acetaminophen   Review of Systems Review of Systems  Constitutional: Negative for chills and fever.  HENT: Negative for congestion.    Eyes: Negative for visual disturbance.  Respiratory: Negative for cough and shortness of breath.   Cardiovascular: Negative for chest pain.  Gastrointestinal: Positive for abdominal pain (rlq), nausea and vomiting. Negative for constipation and diarrhea.  Genitourinary: Positive for dyspareunia, vaginal discharge and vaginal pain. Negative for dysuria, flank pain, frequency, hematuria, urgency and vaginal bleeding.  Skin: Negative.   Neurological: Negative for dizziness, syncope, weakness, light-headedness and headaches.  All other systems reviewed and are negative.    Physical Exam Updated Vital Signs BP (!) 138/104 (BP Location: Left Arm)   Pulse 108   Temp 98 F (36.7 C) (Oral)   Resp 20   Ht 5\' 4"  (1.626 m)   Wt 63.5 kg   LMP 11/04/2016   SpO2 100%   BMI 24.03 kg/m   Physical Exam  Constitutional: She is oriented to person, place, and time. She appears well-developed and well-nourished. No distress.  Patient appears very anxous and is in pain crying on the bed.  HENT:  Head: Normocephalic and atraumatic.  Mouth/Throat: Oropharynx is clear and moist.  Eyes: Conjunctivae are normal. Right eye exhibits no discharge. Left eye exhibits no discharge. No scleral icterus.  Neck: Normal range of motion. Neck supple. No thyromegaly present.  Cardiovascular: Normal rate, regular rhythm, normal heart sounds and intact distal pulses.  Exam reveals no gallop and no friction rub.   No murmur heard. Initially tachycardic in triage on my exam hr 89.  Pulmonary/Chest: Effort normal and breath sounds normal. No respiratory distress.  Abdominal: Soft. Bowel sounds are normal. She exhibits no distension. There is tenderness (lower in the pelvis) in the right lower quadrant, suprapubic area and left lower quadrant. There is rigidity. There is no rebound, no guarding, no CVA tenderness and no tenderness at McBurney's point.  More right sided pelvic pain then rlq pain.  Genitourinary: There is  no rash or tenderness on the right labia. There is no rash or tenderness on the left labia. Cervix exhibits no motion tenderness and no discharge. Right adnexum displays tenderness. Right adnexum displays no mass. Left adnexum displays tenderness. Left adnexum displays no mass. There is tenderness in the vagina. No erythema or bleeding in the vagina. No vaginal discharge found.  Genitourinary Comments: Chaperone present for exam   Musculoskeletal: Normal range of motion.  Lymphadenopathy:    She has no cervical adenopathy.  Neurological: She is alert and oriented to person, place, and time.  Skin: Skin is warm and dry. Capillary refill takes less than 2 seconds.  Nursing note and vitals reviewed.    ED Treatments / Results  Labs (all labs ordered are listed, but only abnormal results are displayed) Labs Reviewed  WET PREP, GENITAL - Abnormal; Notable  for the following:       Result Value   WBC, Wet Prep HPF POC RARE (*)    All other components within normal limits  COMPREHENSIVE METABOLIC PANEL - Abnormal; Notable for the following:    Glucose, Bld 119 (*)    Total Protein 8.5 (*)    ALT 11 (*)    All other components within normal limits  URINALYSIS, ROUTINE W REFLEX MICROSCOPIC - Abnormal; Notable for the following:    APPearance HAZY (*)    pH 8.5 (*)    Protein, ur 30 (*)    All other components within normal limits  URINALYSIS, MICROSCOPIC (REFLEX) - Abnormal; Notable for the following:    Bacteria, UA MANY (*)    Squamous Epithelial / LPF TOO NUMEROUS TO COUNT (*)    All other components within normal limits  URINE CULTURE  LIPASE, BLOOD  CBC  PREGNANCY, URINE  GC/CHLAMYDIA PROBE AMP () NOT AT [facility]    EKG  EKG Interpretation None       Radiology US Transvaginal Non-ob  Result Date: 11/18/2016 CLINICAL DATA:  PERSISTENT CHRONIC PELVIC PAIN FOR 1 YEAR, WORSE OVERNIGHT. EXAM: TRANSABDOMINAL AND TRANSVAGINAL ULTRASOUND OF PELVIS DOPPLER ULTRASOUND OF  OVARIES TECHNIQUE: Both transabdominal and transvaginal ultrasound examinations of the pelvis were performed. Transabdominal technique was performed for global imaging of the pelvis including uterus, ovaries, adnexal regions, and pelvic cul-de-sac. It was necessary to proceed with endovaginal exam following the transabdominal exam to visualize the ovaries and adnexae in better detail. Color and duplex Doppler ultrasound was utilized to evaluate blood flow to the ovaries. COMPARISON:  10/25/2016 FINDINGS: Uterus Measurements: 7.3 x 3.4 x 5.4 cm. No fibroids or other mass visualized. Endometrium Thickness: 2 mm.  No focal abnormality visualized. Right ovary Measurements: 3.4 x 2.8 x 2.4 cm. Several right ovarian follicles noted. Small simple appearing right ovarian anechoic cyst measures 1.8 x 1.0 x 0.9 cm. no other significant right ovarian or adnexal abnormality. Left ovary Measurements: 3.5 x 2.7 x 2.4 cm. Normal appearance. No adnexal mass. Several left ovarian follicles noted. Pulsed Doppler evaluation of both ovaries demonstrates normal low-resistance arterial and venous waveforms. Other findings Small amount of right pelvic fluid, likely physiologic. IMPRESSION: No acute finding by pelvic ultrasound. 1.8 cm simple appearing right ovarian cyst. No evidence of torsion. Trace pelvic free fluid likely physiologic Electronically Signed   By: Judie Petit.  Shick M.D.   On: 11/18/2016 13:48   US Pelvis Complete  Result Date: 11/18/2016 CLINICAL DATA:  PERSISTENT CHRONIC PELVIC PAIN FOR 1 YEAR, WORSE OVERNIGHT. EXAM: TRANSABDOMINAL AND TRANSVAGINAL ULTRASOUND OF PELVIS DOPPLER ULTRASOUND OF OVARIES TECHNIQUE: Both transabdominal and transvaginal ultrasound examinations of the pelvis were performed. Transabdominal technique was performed for global imaging of the pelvis including uterus, ovaries, adnexal regions, and pelvic cul-de-sac. It was necessary to proceed with endovaginal exam following the transabdominal exam to  visualize the ovaries and adnexae in better detail. Color and duplex Doppler ultrasound was utilized to evaluate blood flow to the ovaries. COMPARISON:  10/25/2016 FINDINGS: Uterus Measurements: 7.3 x 3.4 x 5.4 cm. No fibroids or other mass visualized. Endometrium Thickness: 2 mm.  No focal abnormality visualized. Right ovary Measurements: 3.4 x 2.8 x 2.4 cm. Several right ovarian follicles noted. Small simple appearing right ovarian anechoic cyst measures 1.8 x 1.0 x 0.9 cm. no other significant right ovarian or adnexal abnormality. Left ovary Measurements: 3.5 x 2.7 x 2.4 cm. Normal appearance. No adnexal mass. Several left ovarian follicles noted.  Pulsed Doppler evaluation of both ovaries demonstrates normal low-resistance arterial and venous waveforms. Other findings Small amount of right pelvic fluid, likely physiologic. IMPRESSION: No acute finding by pelvic ultrasound. 1.8 cm simple appearing right ovarian cyst. No evidence of torsion. Trace pelvic free fluid likely physiologic Electronically Signed   By: Judie Petit.  Shick M.D.   On: 11/18/2016 13:48   [place] Art/ven Flow Abd Pelv Doppler  Result Date: 11/18/2016 CLINICAL DATA:  PERSISTENT CHRONIC PELVIC PAIN FOR 1 YEAR, WORSE OVERNIGHT. EXAM: TRANSABDOMINAL AND TRANSVAGINAL ULTRASOUND OF PELVIS DOPPLER ULTRASOUND OF OVARIES TECHNIQUE: Both transabdominal and transvaginal ultrasound examinations of the pelvis were performed. Transabdominal technique was performed for global imaging of the pelvis including uterus, ovaries, adnexal regions, and pelvic cul-de-sac. It was necessary to proceed with endovaginal exam following the transabdominal exam to visualize the ovaries and adnexae in better detail. Color and duplex Doppler ultrasound was utilized to evaluate blood flow to the ovaries. COMPARISON:  10/25/2016 FINDINGS: Uterus Measurements: 7.3 x 3.4 x 5.4 cm. No fibroids or other mass visualized. Endometrium Thickness: 2 mm.  No focal abnormality visualized. Right ovary  Measurements: 3.4 x 2.8 x 2.4 cm. Several right ovarian follicles noted. Small simple appearing right ovarian anechoic cyst measures 1.8 x 1.0 x 0.9 cm. no other significant right ovarian or adnexal abnormality. Left ovary Measurements: 3.5 x 2.7 x 2.4 cm. Normal appearance. No adnexal mass. Several left ovarian follicles noted. Pulsed Doppler evaluation of both ovaries demonstrates normal low-resistance arterial and venous waveforms. Other findings Small amount of right pelvic fluid, likely physiologic. IMPRESSION: No acute finding by pelvic ultrasound. 1.8 cm simple appearing right ovarian cyst. No evidence of torsion. Trace pelvic free fluid likely physiologic Electronically Signed   By: Judie Petit.  Shick M.D.   On: 11/18/2016 13:48    Procedures Procedures (including critical care time)  Medications Ordered in ED Medications  ondansetron (ZOFRAN) injection 4 mg (4 mg Intravenous Given 11/18/16 1237)  sodium chloride 0.9 % bolus 1,000 mL (0 mLs Intravenous Stopped 11/18/16 1437)  HYDROmorphone (DILAUDID) injection 0.5 mg (0.5 mg Intravenous Given 11/18/16 1247)  LORazepam (ATIVAN) injection 1 mg (1 mg Intravenous Given 11/18/16 1315)  oxyCODONE-acetaminophen (PERCOCET/ROXICET) 5-325 MG per tablet 1 tablet (1 tablet Oral Given 11/18/16 1437)     Initial Impression / Assessment and Plan / ED Course  I have reviewed the triage vital signs and the nursing notes.  Pertinent labs & imaging results that were available during my care of the patient were reviewed by me and considered in my medical decision making (see chart for details).  Clinical Course   Patient presents with right sided abd pain, pelvic pain, nausea, and vomiting. This pain as been ongoing for the past year but has become fore severe over the past 1-2 days. She also endorses n/v. Patient was seen in ED 12/13 for same pain and has been seen my pcp. She has ob/gyn that is concerned for endometriosis and has surgery scheduled for 1/22. Lab work has  been reassuring here. Urine consistent with asymptomatic bacteruria. Denies urinary symptoms. No leukocytosis. Lab work as been reviewed from 3 weeks ago that was normal. Neg gc.chly, urine cultures negative. Pelvic US today revealed right ovarian cyst that is unchanged from prior. No torsion. Patient was initially anxious and tachycardic. Given dose of pain med and ativan. Pain, anxiety and nausea improved. Tachycardia improved. Patient is afebrile, not tachycardic and normotensive. This pain as been ongoing for 1 year doubt appendicitis. Normal ct 3 weeks ago. Afebrile,  no leukocytosis. Dicussed with patient that can not r/o appendicitis. Dicussed pros and cons to CT scan. Will likely be normal. Patient and mother at bedside have decided against CT scan at this time. Will return if pain worse or if she develop fever, vomiting or worsening pain. Able to tolerate po fluids. Will dc with pain meds and nausea meds. Needs to follow up with OB.gyn this week. Pt is hemodynamically stable, in NAD, & able to ambulate in the ED. Pain has been managed & has no complaints prior to dc. Pt is comfortable with above plan and is stable for discharge at this time. All questions were answered prior to disposition. Strict return precautions for f/u to the ED were discussed.      Final Clinical Impressions(s) / ED Diagnoses   Final diagnoses:  Ovarian torsion  Pelvic pain in female  Non-intractable vomiting with nausea, unspecified vomiting type    New Prescriptions Discharge Medication List as of 11/18/2016  3:05 PM    START taking these medications   Details  ondansetron (ZOFRAN-ODT) 8 MG disintegrating tablet Take 1 tablet (8 mg total) by mouth every 8 (eight) hours as needed for nausea., Starting Sat 11/18/2016, Print         Rise Mu, PA-C 11/18/16 2343    Lorre Nick, MD 11/21/16 [phone]

## 2016-11-18 NOTE — ED Notes (Signed)
US at bedside

## 2016-11-18 NOTE — ED Triage Notes (Signed)
Pt complaint of worsening abdominal pain with associated emesis worsening last night. Pt denies diarrhea. Pt hx of endometriosis and scheduled surgery for same.

## 2016-11-18 NOTE — Discharge Instructions (Signed)
Your ultrasound show 1.8 cm cyst on right ovary. No signs of torsion or fibroids. You can take the pain medication as needed. You can take the zofran as needed. If you develop fevers, worsening pain, urinary symptoms, worsening vomiting please return to the ED if your symptoms worsen immediately. Follow up with your ob/gyn doc.

## 2016-11-18 NOTE — ED Notes (Signed)
Bed: [id number] Expected date:  Expected time:  Means of arrival:  Comments: TRIAGE 1

## 2016-11-20 ENCOUNTER — Ambulatory Visit: Payer: BLUE CROSS/BLUE SHIELD | Admitting: Obstetrics and Gynecology

## 2016-11-20 LAB — URINE CULTURE: Culture: <10000 — AB

## 2016-11-20 LAB — GC/CHLAMYDIA PROBE AMP (~~LOC~~) NOT AT ARMC
CHLAMYDIA, DNA PROBE: NEGATIVE
Neisseria Gonorrhea: NEGATIVE

## 2016-11-27 ENCOUNTER — Other Ambulatory Visit: Payer: BLUE CROSS/BLUE SHIELD

## 2016-11-28 ENCOUNTER — Encounter: Payer: BLUE CROSS/BLUE SHIELD | Admitting: Obstetrics and Gynecology

## 2016-11-28 ENCOUNTER — Inpatient Hospital Stay: Admission: RE | Admit: 2016-11-28 | Payer: BLUE CROSS/BLUE SHIELD | Source: Ambulatory Visit

## 2016-11-29 ENCOUNTER — Encounter: Payer: BLUE CROSS/BLUE SHIELD | Admitting: Obstetrics and Gynecology

## 2016-11-29 ENCOUNTER — Inpatient Hospital Stay: Admission: RE | Admit: 2016-11-29 | Payer: BLUE CROSS/BLUE SHIELD | Source: Ambulatory Visit

## 2016-12-01 ENCOUNTER — Encounter (HOSPITAL_COMMUNITY): Payer: Self-pay | Admitting: Emergency Medicine

## 2016-12-01 ENCOUNTER — Telehealth: Payer: Self-pay | Admitting: Obstetrics and Gynecology

## 2016-12-01 ENCOUNTER — Emergency Department (HOSPITAL_COMMUNITY)
Admission: EM | Admit: 2016-12-01 | Discharge: 2016-12-02 | Disposition: A | Discharge status: Home / Self Care | Payer: BLUE CROSS/BLUE SHIELD | Attending: Emergency Medicine | Admitting: Emergency Medicine

## 2016-12-01 ENCOUNTER — Emergency Department (HOSPITAL_COMMUNITY): Payer: BLUE CROSS/BLUE SHIELD

## 2016-12-01 DIAGNOSIS — R1031 Right lower quadrant pain: Secondary | ICD-10-CM | POA: Insufficient documentation

## 2016-12-01 DIAGNOSIS — J45909 Unspecified asthma, uncomplicated: Secondary | ICD-10-CM | POA: Insufficient documentation

## 2016-12-01 DIAGNOSIS — R109 Unspecified abdominal pain: Secondary | ICD-10-CM

## 2016-12-01 DIAGNOSIS — Z79899 Other long term (current) drug therapy: Secondary | ICD-10-CM | POA: Diagnosis not present

## 2016-12-01 DIAGNOSIS — R1011 Right upper quadrant pain: Secondary | ICD-10-CM | POA: Diagnosis present

## 2016-12-01 LAB — COMPREHENSIVE METABOLIC PANEL
ALBUMIN: 4.7 g/dL (ref 3.5–5.0)
ALT: 7 U/L — ABNORMAL LOW (ref 14–54)
ANION GAP: 10 (ref 5–15)
AST: 22 U/L (ref 15–41)
Alkaline Phosphatase: 44 U/L (ref 38–126)
BUN: 11 mg/dL (ref 6–20)
CHLORIDE: 106 mmol/L (ref 101–111)
CO2: 23 mmol/L (ref 22–32)
Calcium: 9.3 mg/dL (ref 8.9–10.3)
Creatinine, Ser: 0.79 mg/dL (ref 0.44–1.00)
GFR calc Af Amer: >60 mL/min (ref >60)
GFR calc non Af Amer: >60 mL/min (ref >60)
GLUCOSE: 112 mg/dL — AB (ref 65–99)
POTASSIUM: 4 mmol/L (ref 3.5–5.1)
SODIUM: 139 mmol/L (ref 135–145)
TOTAL PROTEIN: 7.4 g/dL (ref 6.5–8.1)
Total Bilirubin: 0.5 mg/dL (ref 0.3–1.2)

## 2016-12-01 LAB — CBC
HEMATOCRIT: 41.9 % (ref 36.0–46.0)
HEMOGLOBIN: 14.5 g/dL (ref 12.0–15.0)
MCH: 31 pg (ref 26.0–34.0)
MCHC: 34.6 g/dL (ref 30.0–36.0)
MCV: 89.5 fL (ref 78.0–100.0)
Platelets: 184 10*3/uL (ref 150–400)
RBC: 4.68 MIL/uL (ref 3.87–5.11)
RDW: 11.8 % (ref 11.5–15.5)
WBC: 7.8 10*3/uL (ref 4.0–10.5)

## 2016-12-01 LAB — URINALYSIS, ROUTINE W REFLEX MICROSCOPIC
Bilirubin Urine: NEGATIVE
Glucose, UA: NEGATIVE mg/dL
HGB URINE DIPSTICK: NEGATIVE
Ketones, ur: NEGATIVE mg/dL
NITRITE: NEGATIVE
PROTEIN: NEGATIVE mg/dL
Specific Gravity, Urine: 1.012 (ref 1.005–1.030)
pH: 5 (ref 5.0–8.0)

## 2016-12-01 LAB — LIPASE, BLOOD: LIPASE: 28 U/L (ref 11–51)

## 2016-12-01 IMAGING — CT CT ABD-PELV W/ CM
2 of 4 series · 16 of 46 positions shown, 18 images · IV contrast (ISOVUE)
Comparison: [DATE] CT abdomen and pelvis.

CLINICAL DATA: 25 y/o  F; right upper quadrant pain.

EXAM:
CT ABDOMEN AND PELVIS WITH CONTRAST
TECHNIQUE: Multidetector CT imaging of the abdomen and pelvis was performed
using the standard protocol following bolus administration of
intravenous contrast.
CONTRAST:  100mL [11] IOPAMIDOL ([11]) INJECTION 61%

[Series 2: abd/pel with · axial · 0.74mm/px · z∈[+1020,+1414]mm · 13 of 89 slices shown, 15 images]
[im 5/89  soft-tissue]
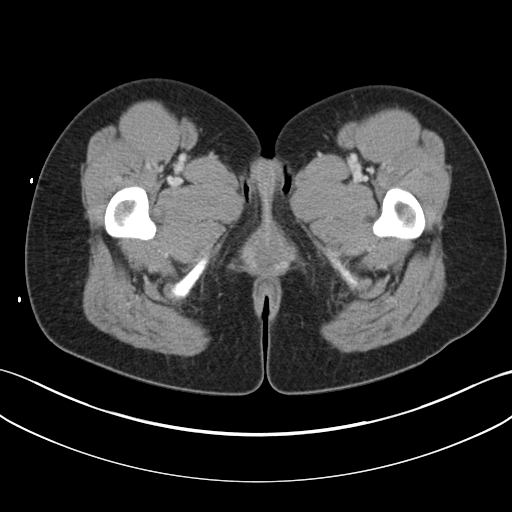
[im 5/89  bone]
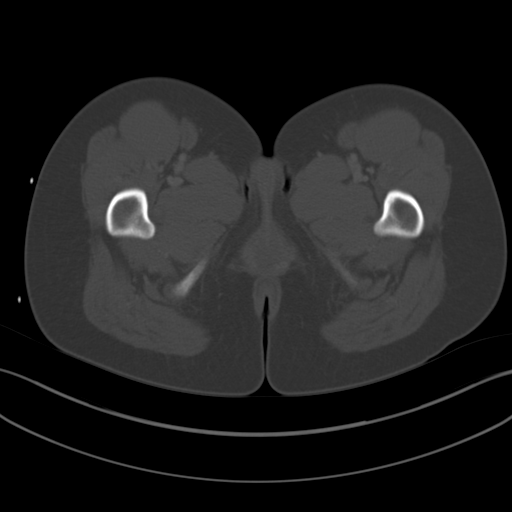
[im 13/89  soft-tissue]
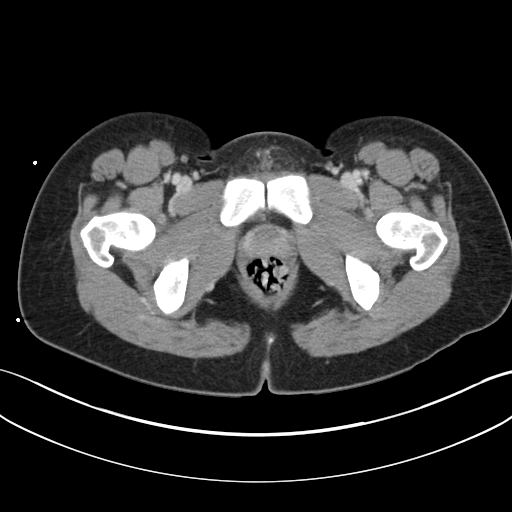
[im 17/89  soft-tissue]
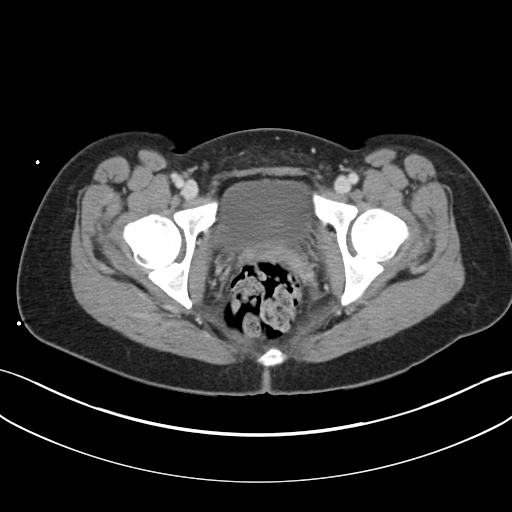
[im 26/89  soft-tissue]
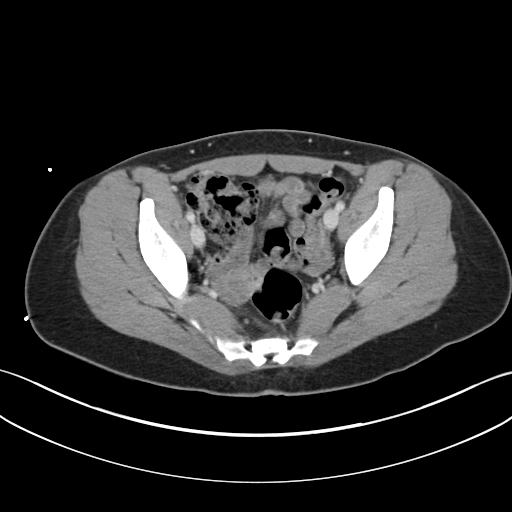
[im 30/89  soft-tissue]
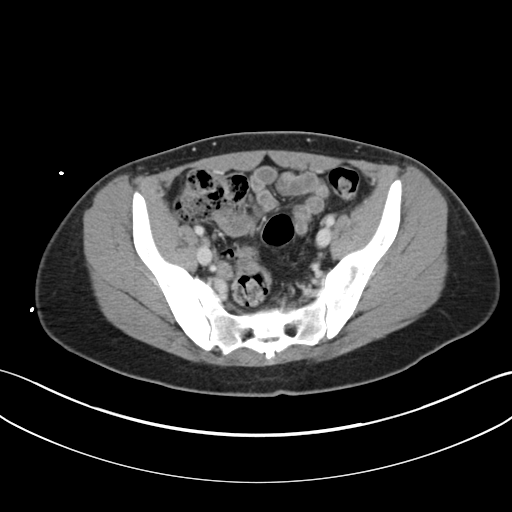
[im 38/89  soft-tissue]
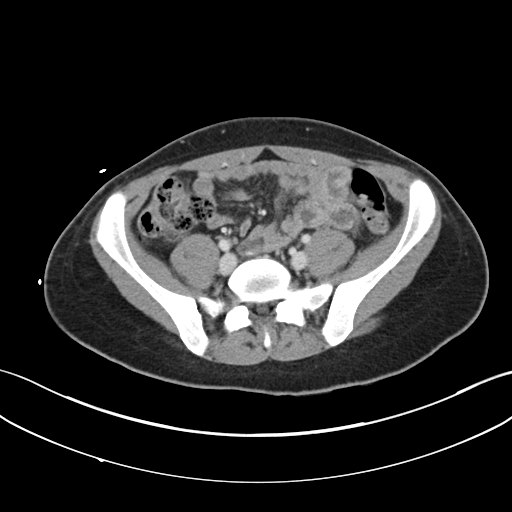
[im 47/89  soft-tissue]
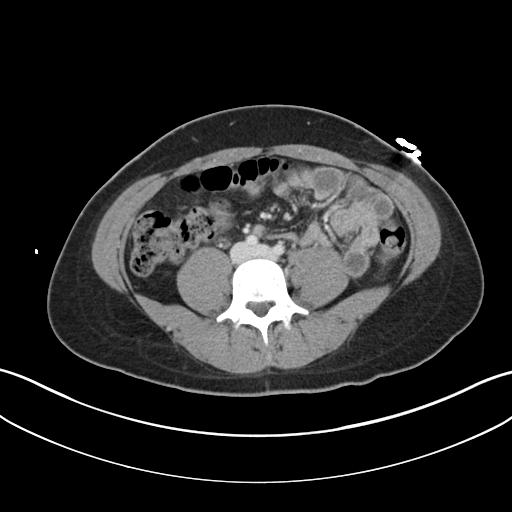
[im 51/89  soft-tissue]
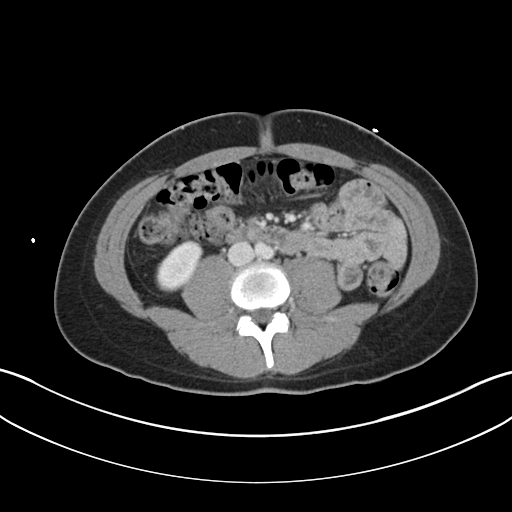
[im 59/89  soft-tissue]
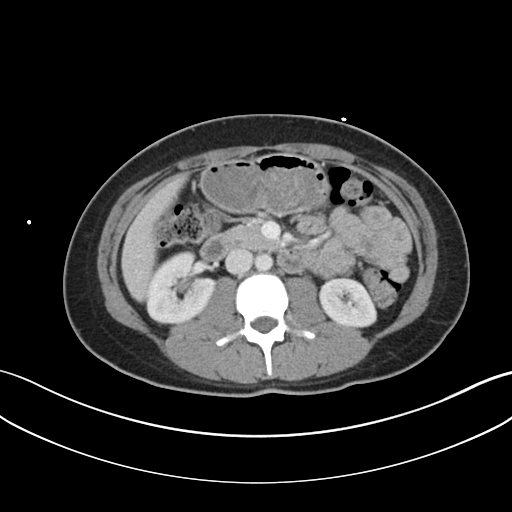
[im 59/89  bone]
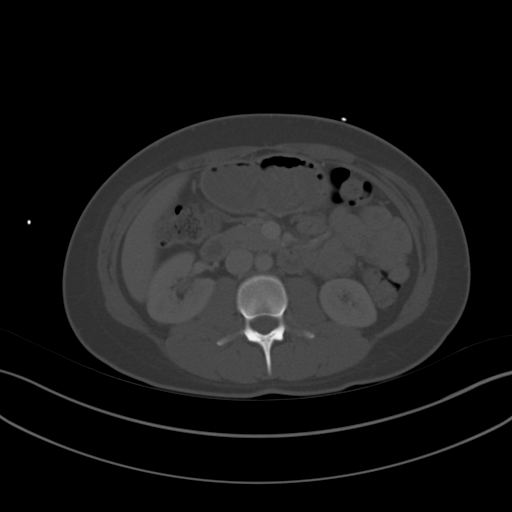
[im 63/89  soft-tissue]
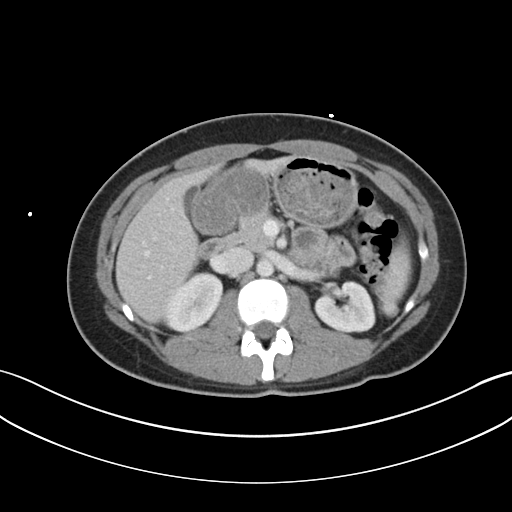
[im 72/89  soft-tissue]
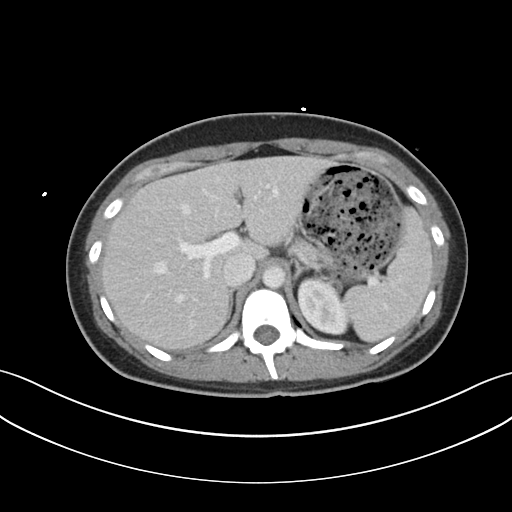
[im 76/89  soft-tissue]
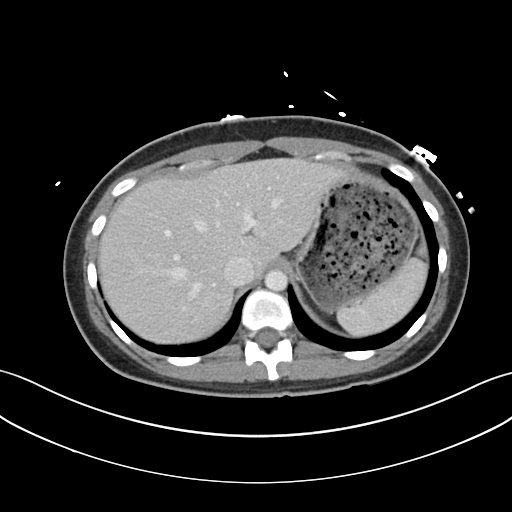
[im 84/89  soft-tissue]
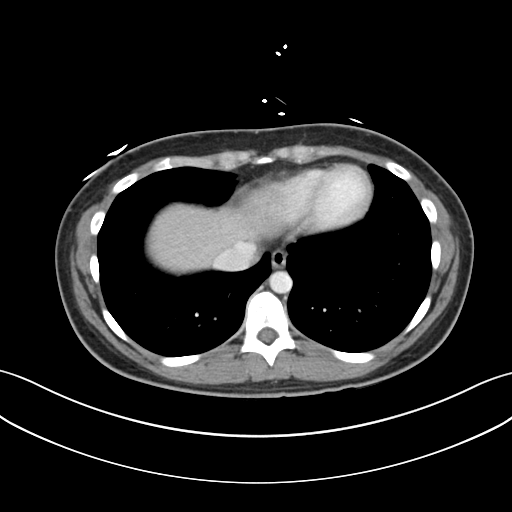

[Series 5: coronal a/|p · coronal · 0.64mm/px · 3 of 106 slices shown]
[im 36/106  soft-tissue]
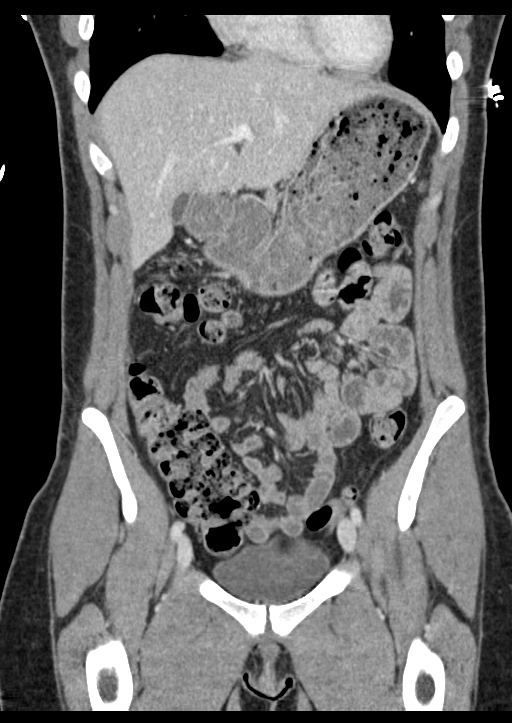
[im 47/106  soft-tissue]
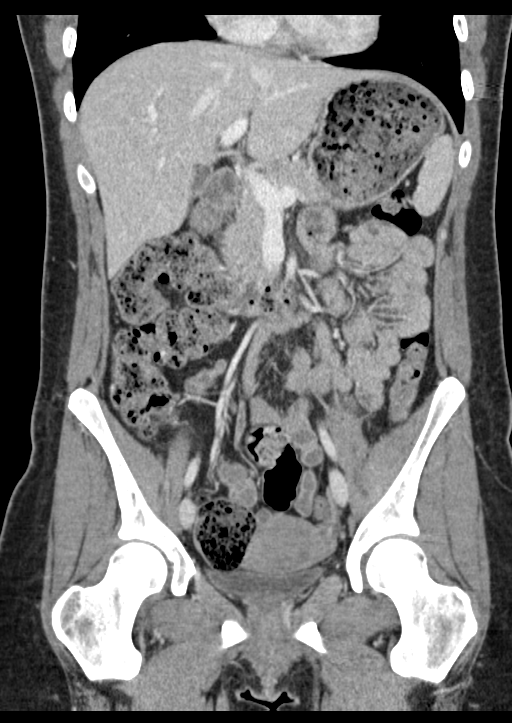
[im 59/106  soft-tissue]
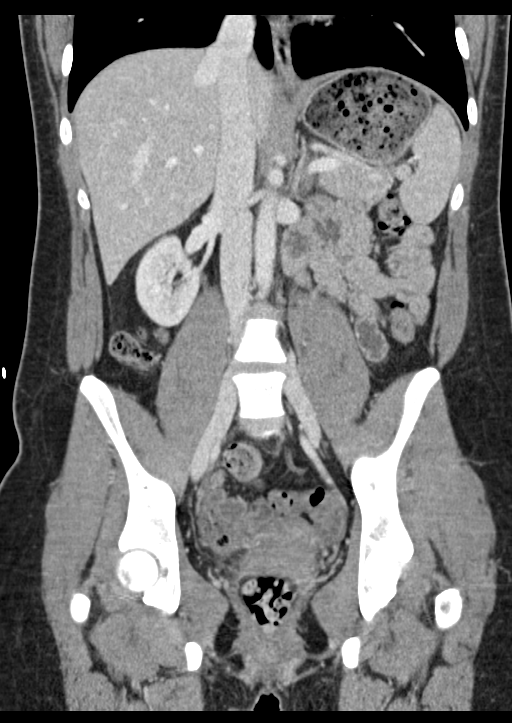

[16 of 46 positions shown; findings below may reference images not displayed]

FINDINGS: Lower chest: No acute abnormality.

Hepatobiliary: No focal liver abnormality is seen. No gallstones,
gallbladder wall thickening, or biliary dilatation.

Pancreas: Unremarkable. No pancreatic ductal dilatation or
surrounding inflammatory changes.

Spleen: 13 mm splenule posterior and inferior to spleen.

Adrenals/Urinary Tract: Adrenal glands are unremarkable. Kidneys are
normal, without renal calculi, focal lesion, or hydronephrosis.
Bladder is unremarkable.

Stomach/Bowel: Stomach is within normal limits. Appendix appears
normal. No evidence of bowel wall thickening, distention, or
inflammatory changes.

Vascular/Lymphatic: No significant vascular findings are present. No
enlarged abdominal or pelvic lymph nodes.

Reproductive: Uterus and bilateral adnexa are unremarkable.

Other: No abdominal wall hernia or abnormality. No abdominopelvic
ascites.

Musculoskeletal: No acute or significant osseous findings.
IMPRESSION: No acute process identified as explanation for pain. Previously
identified punctate kidney stones are not appreciated. Unremarkable
CT of abdomen and pelvis for age.

By: CILEUS M.D.

## 2016-12-01 MED ORDER — HYDROMORPHONE HCL 1 MG/ML IJ SOLN
1.0000 mg | Freq: Once | INTRAMUSCULAR | Status: AC
  Administered 2016-12-01: 1 mg via INTRAVENOUS
  Filled 2016-12-01: qty 1

## 2016-12-01 MED ORDER — SODIUM CHLORIDE 0.9 % IV BOLUS (SEPSIS)
1000.0000 mL | Freq: Once | INTRAVENOUS | Status: AC
Start: 2016-12-01 — End: 2016-12-01
  Administered 2016-12-01: 1000 mL via INTRAVENOUS

## 2016-12-01 MED ORDER — IOPAMIDOL (ISOVUE-300) INJECTION 61%
100.0000 mL | Freq: Once | INTRAVENOUS | Status: AC | PRN
  Administered 2016-12-01: 100 mL via INTRAVENOUS

## 2016-12-01 MED ORDER — OXYCODONE-ACETAMINOPHEN 5-325 MG PO TABS: 1.0000 | ORAL_TABLET | ORAL | 0 refills | Status: DC | PRN

## 2016-12-01 MED ORDER — HYDROMORPHONE HCL 1 MG/ML IJ SOLN
1.0000 mg | Freq: Once | INTRAMUSCULAR | Status: AC
  Administered 2016-12-02: 1 mg via INTRAVENOUS
  Filled 2016-12-01: qty 1

## 2016-12-01 MED ORDER — METOCLOPRAMIDE HCL 5 MG/ML IJ SOLN
10.0000 mg | Freq: Once | INTRAMUSCULAR | Status: AC
Start: 2016-12-02 — End: 2016-12-02
  Administered 2016-12-02: 10 mg via INTRAVENOUS
  Filled 2016-12-01: qty 2

## 2016-12-01 MED ORDER — ONDANSETRON HCL 4 MG/2ML IJ SOLN
4.0000 mg | Freq: Once | INTRAMUSCULAR | Status: AC
  Administered 2016-12-01: 4 mg via INTRAVENOUS
  Filled 2016-12-01: qty 2

## 2016-12-01 MED ORDER — IOPAMIDOL (ISOVUE-300) INJECTION 61%
INTRAVENOUS | Status: AC
  Filled 2016-12-01: qty 100

## 2016-12-01 NOTE — Telephone Encounter (Signed)
PT WAS SCHEDULED FOR PRE OP THIS WEEK AND DR DE IS NOT GOING TO BE HERE ON Monday FOR SURGERY, PT IN PAIN, THIS IS THE SECOND TIME WE HAVE HAD TO PUSH SURGERY OUT, CAN WE PLEASE GIVE HER SOME PAIN MEDICINE TO HOLD HER, SHE IS OUT OF IT.

## 2016-12-01 NOTE — ED Triage Notes (Signed)
Pt in tears, hunched over in pain c/o right upper quadrant pain. Feels like it is a knot under her right side rib cage. Pain onset aprox 1 hour ago. Pt was supposed to have surgery beginning of week to verify endometriosis. Pt reports pain is different and more severe.

## 2016-12-01 NOTE — Telephone Encounter (Signed)
Pt aware rx left up front for p/u. C/o of a knot on her stomach. Advised she could make an appt with pcp or see CNM next week.

## 2016-12-01 NOTE — ED Provider Notes (Signed)
[facility]-EMERGENCY DEPT Provider Note   CSN: [id number] Arrival date & time: 12/01/16  2015 By signing my name below, I, Levon Hedger, attest that this documentation has been prepared under the direction and in the presence of non-physician practitioner, Melburn Hake, PA-C. Electronically Signed: Levon Hedger, Scribe. 12/01/2016. 9:36 PM.   History   Chief Complaint Chief Complaint  Patient presents with  . Abdominal Pain   HPI Abigail Harding is a 26 y.o. female with a history of ovarian cyst, dysmenorrhea, and chronic pelvic pain  who presents to the Emergency Department complaining of sudden onset, constant RUQ pain, onset tonight. She describes this as 10/10, sharp, gradually worsening RUQ pain and states she has never experienced this before. Per pt, pain is exacerbated by stretching and with urination. No alleviating factors noted. Pt states she took a percocet a few hours prior to this episode for her chronic lower abdominal pain, but did not taken any medication when this new pain began. She notes associated diaphoresis, lightheadedness, and nausea. Pt states this is different and more severe than her chronic abdominal pain which has been attributed to suspected endometriosis; she is scheduled to have surgery on 12/04/16 to confirm endometriosis diagnosis. LNMP 11/04/16. PSHx of laparoscopy. She denies fever, cough, SOB, urinary sxs, diarrhea, vomiting, vaginal bleeding, or vaginal discharge.   The history is provided by the patient. No language interpreter was used.   Past Medical History:  Diagnosis Date  . Anxiety   . Asthma   . Back pain   . Ovarian cyst     Patient Active Problem List   Diagnosis Date Noted  . Right lower quadrant abdominal pain 10/31/2016  . Family history of endometriosis in first degree relative 10/31/2016  . Female dyspareunia 10/31/2016  . Dysmenorrhea 10/31/2016  . Pelvic pain 10/31/2016  . Chronic pelvic pain in female 03/16/2016  . Generalized  anxiety disorder 03/02/2016  . Oligo-ovulation 11/17/2015    Past Surgical History:  Procedure Laterality Date  . LAPAROSCOPY    . TONSILLECTOMY      OB History    Gravida Para Term Preterm AB Living   0 0 0 0 0 0   SAB TAB Ectopic Multiple Live Births   0 0 0 0 0       Home Medications    Prior to Admission medications   Medication Sig Start Date End Date Taking? Authorizing Provider  Norethindrone-Ethinyl Estradiol-Fe Biphas (LO LOESTRIN FE) 1 MG-10 MCG / 10 MCG tablet Take 1 tablet by mouth daily. 07/16/16  Yes Historical Provider, MD  oxyCODONE-acetaminophen (PERCOCET/ROXICET) 5-325 MG tablet Take 1-2 tablets by mouth every 6 (six) hours as needed for severe pain. Patient taking differently: Take 1 tablet by mouth every 6 (six) hours as needed for severe pain.  11/18/16  Yes Rise Mu, PA-C  promethazine (PHENERGAN) 25 MG tablet Take 1 tablet (25 mg total) by mouth every 8 (eight) hours as needed for nausea or vomiting. Patient taking differently: Take 25 mg by mouth every 6 (six) hours as needed for nausea or vomiting.  11/15/16  Yes Prentice Docker Defrancesco, MD  naproxen (NAPROSYN) 500 MG tablet Take 1 tablet (500 mg total) by mouth 2 (two) times daily. 12/02/16   Barrett Henle, PA-C  ondansetron (ZOFRAN-ODT) 8 MG disintegrating tablet Take 1 tablet (8 mg total) by mouth every 8 (eight) hours as needed for nausea. Patient not taking: Reported on 12/01/2016 11/18/16   Rise Mu, PA-C  oxyCODONE-acetaminophen (PERCOCET/ROXICET) 5-325 MG  tablet Take 1 tablet by mouth every 4 (four) hours as needed for severe pain. 12/01/16   Melody Suzan NailerN Shambley, CNM    Family History Family History  Problem Relation Age of Onset  . Diabetes Mother   . Diabetes Maternal Grandmother   . Heart disease Maternal Grandmother     GRT ALSO  . Breast cancer Paternal Grandmother     GRT  . Ovarian cancer Neg Hx   . Colon cancer Neg Hx     Social History Social History  Substance  Use Topics  . Smoking status: Never Smoker  . Smokeless tobacco: Former NeurosurgeonUser  . Alcohol use Yes     Comment: RARE   Allergies   Tramadol; Morphine and related; and Hydrocodone-acetaminophen  Review of Systems Review of Systems  Constitutional: Positive for diaphoresis.  Gastrointestinal: Positive for abdominal pain and nausea.  Neurological: Positive for light-headedness.  All other systems reviewed and are negative.  Physical Exam Updated Vital Signs BP 113/71 (BP Location: Right Arm)   Pulse 94   Temp 98.1 F (36.7 C) (Oral)   Resp 23   Ht 5\' 4"  (1.626 m)   Wt 63.5 kg   LMP 11/04/2016 Comment: HCG neg 12/01/16  SpO2 100%   BMI 24.03 kg/m   Physical Exam  Constitutional: She is oriented to person, place, and time. She appears well-developed and well-nourished.  Pt crying and rocking around in bed holding onto her abdomen.   HENT:  Head: Normocephalic and atraumatic.  Mouth/Throat: Uvula is midline and oropharynx is clear and moist. Mucous membranes are dry. No oropharyngeal exudate, posterior oropharyngeal edema, posterior oropharyngeal erythema or tonsillar abscesses. No tonsillar exudate.  Eyes: Conjunctivae and EOM are normal. Right eye exhibits no discharge. Left eye exhibits no discharge. No scleral icterus.  Neck: Normal range of motion. Neck supple.  Cardiovascular: Regular rhythm, normal heart sounds and intact distal pulses.   Tachycardic heart rate 120   Pulmonary/Chest: Effort normal and breath sounds normal. No respiratory distress. She has no wheezes. She has no rales. She exhibits no tenderness.  Abdominal: Soft. Bowel sounds are normal. She exhibits no distension and no mass. There is tenderness. There is guarding. There is no rebound. No hernia.  Mild tenderness over RLQ with exquisite TTP over RUQ. No CVA tenderness  Musculoskeletal: She exhibits no edema.  Neurological: She is alert and oriented to person, place, and time.  Skin: Skin is warm and dry.    Nursing note and vitals reviewed.  ED Treatments / Results  DIAGNOSTIC STUDIES:  Oxygen Saturation is 99% on RA, normal by my interpretation.    COORDINATION OF CARE:  9:31 PM Discussed treatment plan with pt at bedside and pt agreed to plan.   Labs (all labs ordered are listed, but only abnormal results are displayed) Labs Reviewed  COMPREHENSIVE METABOLIC PANEL - Abnormal; Notable for the following:       Result Value   Glucose, Bld 112 (*)    ALT 7 (*)    All other components within normal limits  URINALYSIS, ROUTINE W REFLEX MICROSCOPIC - Abnormal; Notable for the following:    APPearance HAZY (*)    Leukocytes, UA TRACE (*)    Bacteria, UA RARE (*)    Squamous Epithelial / LPF 6-30 (*)    All other components within normal limits  LIPASE, BLOOD  CBC  I-STAT BETA HCG BLOOD, ED ([facility], [facility], [facility] ONLY)    EKG  EKG Interpretation None  Radiology Ct Abdomen Pelvis W Contrast  Result Date: 12/01/2016 CLINICAL DATA:  26 y/o  F; right upper quadrant pain. EXAM: CT ABDOMEN AND PELVIS WITH CONTRAST TECHNIQUE: Multidetector CT imaging of the abdomen and pelvis was performed using the standard protocol following bolus administration of intravenous contrast. CONTRAST:  ISOVUE-300 IOPAMIDOL (ISOVUE-300) INJECTION 61% COMPARISON:  12/22/2014 CT abdomen and pelvis. FINDINGS: Lower chest: No acute abnormality. Hepatobiliary: No focal liver abnormality is seen. No gallstones, gallbladder wall thickening, or biliary dilatation. Pancreas: Unremarkable. No pancreatic ductal dilatation or surrounding inflammatory changes. Spleen: 13 mm splenule posterior and inferior to spleen. Adrenals/Urinary Tract: Adrenal glands are unremarkable. Kidneys are normal, without renal calculi, focal lesion, or hydronephrosis. Bladder is unremarkable. Stomach/Bowel: Stomach is within normal limits. Appendix appears normal. No evidence of bowel wall thickening, distention, or inflammatory changes.  Vascular/Lymphatic: No significant vascular findings are present. No enlarged abdominal or pelvic lymph nodes. Reproductive: Uterus and bilateral adnexa are unremarkable. Other: No abdominal wall hernia or abnormality. No abdominopelvic ascites. Musculoskeletal: No acute or significant osseous findings. IMPRESSION: No acute process identified as explanation for pain. Previously identified punctate kidney stones are not appreciated. Unremarkable CT of abdomen and pelvis for age. Electronically Signed   By: Mitzi Hansen M.D.   On: 12/01/2016 23:35    Procedures Procedures (including critical care time)  Medications Ordered in ED Medications  sodium chloride 0.9 % bolus 1,000 mL (0 mLs Intravenous Stopped 12/01/16 2332)  HYDROmorphone (DILAUDID) injection 1 mg (1 mg Intravenous Given 12/01/16 2224)  ondansetron (ZOFRAN) injection 4 mg (4 mg Intravenous Given 12/01/16 2227)  iopamidol (ISOVUE-300) 61 % injection 100 mL (100 mLs Intravenous Contrast Given 12/01/16 2310)  HYDROmorphone (DILAUDID) injection 1 mg (1 mg Intravenous Given 12/02/16 0056)  metoCLOPramide (REGLAN) injection 10 mg (10 mg Intravenous Given 12/02/16 0057)  naproxen (NAPROSYN) tablet 500 mg (500 mg Oral Given 12/02/16 0125)     Initial Impression / Assessment and Plan / ED Course  I have reviewed the triage vital signs and the nursing notes.  Pertinent labs & imaging results that were available during my care of the patient were reviewed by me and considered in my medical decision making (see chart for details).     Patient presents with sudden onset of right upper abdominal pain that started earlier this evening. Endorses associated nausea. Patient reports having chronic lower abdominal pain but states that this pain feels different from her chronic abdominal pain. History of right ovarian cysts which was diagnosed during her last ED visit on 11/18/16. Initial vitals revealed heart rate 120, afebrile, normotensive. On  initial exam patient is crying intermittently, holding onto her abdomen and rocking in the bed. Exam revealed exquisite tenderness over right upper quadrant with guarding, mild tenderness to right lower quadrant. No CVA tenderness. Remaining exam unremarkable. Patient given IV fluids and pain meds. Pregnancy negative (lab tech printed I-stat results which they showed me personally). Remaining labs and urine unremarkable. No leukocytosis. Due to patient with exquisite tenderness and guarding on abdominal exam will order CT abdomen for further evaluation. CT abdomen negative. Discussed pt with Dr. Eudelia Bunch. Suspect pt's sxs are likely due to ruptured ovarian cyst. Reevaluation patient is resting comfortably and reports significant improvement of her symptoms. Repeat vitals revealed HR 91. Discussed results and plan for discharge with patient. Plan to discharge patient home with NSAIDs and symptomatic treatment. Advised patient to follow up with her OB/GYN at her scheduled appointment for further management of her chronic abdominal pain related  to her ovarian cysts versus possible endometriosis. Discussed return precautions.  Final Clinical Impressions(s) / ED Diagnoses   Final diagnoses:  Abdominal pain, unspecified abdominal location    New Prescriptions Discharge Medication List as of 12/02/2016  1:22 AM    START taking these medications   Details  naproxen (NAPROSYN) 500 MG tablet Take 1 tablet (500 mg total) by mouth 2 (two) times daily., Starting Sat 12/02/2016, Print       I personally performed the services described in this documentation, which was scribed in my presence. The recorded information has been reviewed and is accurate.    Satira Sark [place], [place] 12/02/16 1610    Nira Conn, MD 12/02/16 2245

## 2016-12-02 MED ORDER — NAPROXEN 500 MG PO TABS: 500.0000 mg | ORAL_TABLET | Freq: Two times a day (BID) | ORAL | 0 refills | Status: DC

## 2016-12-02 MED ORDER — NAPROXEN 500 MG PO TABS
500.0000 mg | ORAL_TABLET | Freq: Once | ORAL | Status: AC
  Administered 2016-12-02: 500 mg via ORAL
  Filled 2016-12-02: qty 1

## 2016-12-02 NOTE — ED Notes (Signed)
PATIENT'S I-STAT BETA RESULTS LESS THEN 5.0

## 2016-12-02 NOTE — Discharge Instructions (Signed)
Take your medication as prescribed as needed for pain relief. I recommend following up with her gynecologist for follow-up evaluation regarding your pelvic pain and suspected right ovarian cyst rupture.  Please return to the Emergency Department if symptoms worsen or new onset of fever, new/worsening abdominal pain, vomiting, unable to keep fluids down, vaginal bleeding, vaginal d/c.

## 2016-12-03 ENCOUNTER — Encounter (HOSPITAL_COMMUNITY): Payer: Self-pay | Admitting: Emergency Medicine

## 2016-12-03 ENCOUNTER — Emergency Department (HOSPITAL_COMMUNITY)
Admission: EM | Admit: 2016-12-03 | Discharge: 2016-12-03 | Disposition: A | Discharge status: Home / Self Care | Payer: BLUE CROSS/BLUE SHIELD | Attending: Emergency Medicine | Admitting: Emergency Medicine

## 2016-12-03 DIAGNOSIS — N730 Acute parametritis and pelvic cellulitis: Secondary | ICD-10-CM

## 2016-12-03 DIAGNOSIS — J45909 Unspecified asthma, uncomplicated: Secondary | ICD-10-CM | POA: Diagnosis not present

## 2016-12-03 DIAGNOSIS — Z79899 Other long term (current) drug therapy: Secondary | ICD-10-CM | POA: Insufficient documentation

## 2016-12-03 DIAGNOSIS — R1030 Lower abdominal pain, unspecified: Secondary | ICD-10-CM

## 2016-12-03 DIAGNOSIS — N739 Female pelvic inflammatory disease, unspecified: Secondary | ICD-10-CM | POA: Insufficient documentation

## 2016-12-03 DIAGNOSIS — R1084 Generalized abdominal pain: Secondary | ICD-10-CM | POA: Diagnosis present

## 2016-12-03 LAB — COMPREHENSIVE METABOLIC PANEL
ALBUMIN: 4.2 g/dL (ref 3.5–5.0)
ALT: 10 U/L — ABNORMAL LOW (ref 14–54)
ANION GAP: 10 (ref 5–15)
AST: 26 U/L (ref 15–41)
Alkaline Phosphatase: 37 U/L — ABNORMAL LOW (ref 38–126)
BILIRUBIN TOTAL: 1 mg/dL (ref 0.3–1.2)
BUN: 6 mg/dL (ref 6–20)
CALCIUM: 9.3 mg/dL (ref 8.9–10.3)
CO2: 24 mmol/L (ref 22–32)
Chloride: 105 mmol/L (ref 101–111)
Creatinine, Ser: 0.84 mg/dL (ref 0.44–1.00)
GFR calc Af Amer: >60 mL/min (ref >60)
GFR calc non Af Amer: >60 mL/min (ref >60)
GLUCOSE: 89 mg/dL (ref 65–99)
POTASSIUM: 3.7 mmol/L (ref 3.5–5.1)
Sodium: 139 mmol/L (ref 135–145)
TOTAL PROTEIN: 6.8 g/dL (ref 6.5–8.1)

## 2016-12-03 LAB — CBC WITH DIFFERENTIAL/PLATELET
Basophils Absolute: 0 10*3/uL (ref 0.0–0.1)
Basophils Relative: 0 %
Eosinophils Absolute: 0.1 10*3/uL (ref 0.0–0.7)
Eosinophils Relative: 1 %
HEMATOCRIT: 40.7 % (ref 36.0–46.0)
HEMOGLOBIN: 13.8 g/dL (ref 12.0–15.0)
LYMPHS ABS: 1.8 10*3/uL (ref 0.7–4.0)
LYMPHS PCT: 28 %
MCH: 30.3 pg (ref 26.0–34.0)
MCHC: 33.9 g/dL (ref 30.0–36.0)
MCV: 89.3 fL (ref 78.0–100.0)
MONO ABS: 0.3 10*3/uL (ref 0.1–1.0)
MONOS PCT: 5 %
NEUTROS ABS: 4.4 10*3/uL (ref 1.7–7.7)
NEUTROS PCT: 66 %
Platelets: 175 10*3/uL (ref 150–400)
RBC: 4.56 MIL/uL (ref 3.87–5.11)
RDW: 11.6 % (ref 11.5–15.5)
WBC: 6.6 10*3/uL (ref 4.0–10.5)

## 2016-12-03 LAB — URINALYSIS, ROUTINE W REFLEX MICROSCOPIC
Bilirubin Urine: NEGATIVE
GLUCOSE, UA: NEGATIVE mg/dL
Hgb urine dipstick: NEGATIVE
KETONES UR: NEGATIVE mg/dL
LEUKOCYTES UA: NEGATIVE
NITRITE: NEGATIVE
PH: 8 (ref 5.0–8.0)
Protein, ur: NEGATIVE mg/dL
SPECIFIC GRAVITY, URINE: 1.005 (ref 1.005–1.030)

## 2016-12-03 LAB — WET PREP, GENITAL
CLUE CELLS WET PREP: NONE SEEN
Sperm: NONE SEEN
TRICH WET PREP: NONE SEEN
Yeast Wet Prep HPF POC: NONE SEEN

## 2016-12-03 LAB — LIPASE, BLOOD: Lipase: 22 U/L (ref 11–51)

## 2016-12-03 MED ORDER — ONDANSETRON HCL 4 MG/2ML IJ SOLN
4.0000 mg | Freq: Once | INTRAMUSCULAR | Status: AC
  Administered 2016-12-03: 4 mg via INTRAVENOUS
  Filled 2016-12-03: qty 2

## 2016-12-03 MED ORDER — HYDROMORPHONE HCL 2 MG/ML IJ SOLN
1.0000 mg | Freq: Once | INTRAMUSCULAR | Status: AC
  Administered 2016-12-03: 1 mg via INTRAVENOUS
  Filled 2016-12-03: qty 1

## 2016-12-03 MED ORDER — DOXYCYCLINE HYCLATE 100 MG PO CAPS: 100.0000 mg | ORAL_CAPSULE | Freq: Two times a day (BID) | ORAL | 0 refills | Status: AC

## 2016-12-03 MED ORDER — HYDROMORPHONE HCL 2 MG/ML IJ SOLN
0.5000 mg | Freq: Once | INTRAMUSCULAR | Status: AC
  Administered 2016-12-03: 0.5 mg via INTRAVENOUS
  Filled 2016-12-03: qty 1

## 2016-12-03 MED ORDER — METRONIDAZOLE 500 MG PO TABS
500.0000 mg | ORAL_TABLET | Freq: Once | ORAL | Status: AC
  Administered 2016-12-03: 500 mg via ORAL
  Filled 2016-12-03: qty 1

## 2016-12-03 MED ORDER — METRONIDAZOLE 500 MG PO TABS: 500.0000 mg | ORAL_TABLET | Freq: Two times a day (BID) | ORAL | 0 refills | Status: AC

## 2016-12-03 MED ORDER — CEFTRIAXONE SODIUM 250 MG IJ SOLR
250.0000 mg | Freq: Once | INTRAMUSCULAR | Status: AC
  Administered 2016-12-03: 250 mg via INTRAMUSCULAR
  Filled 2016-12-03: qty 250

## 2016-12-03 MED ORDER — LIDOCAINE HCL (PF) 1 % IJ SOLN
INTRAMUSCULAR | Status: AC
  Filled 2016-12-03: qty 5

## 2016-12-03 NOTE — ED Notes (Signed)
Pt left crying,given pain medication and made as comfortable as possible. Pt informed of discharge instructions and has ride home

## 2016-12-03 NOTE — ED Notes (Signed)
Pt. Said she has more pressure pain when her bladder is full, nurse notified

## 2016-12-03 NOTE — ED Triage Notes (Signed)
Pt vitals 130/90 pulse 100, O 98%. Per EMS pt has severe abd pain and  h/s of ovarian cysts and is supposed to get surgery tomorrow but dr cancelled due to family emergency

## 2016-12-03 NOTE — ED Provider Notes (Signed)
[facility]-EMERGENCY DEPT Provider Note   CSN: [id number] Arrival date & time: 12/03/16  1733     History   Chief Complaint Chief Complaint  Patient presents with  . Abdominal Pain    HPI Abigail Harding is a 26 y.o. female.  HPI 26 year old female with a history of chronic pelvic pain presenting with worsening lower abdominal pain, right upper quadrant pain and right flank pain. She states that she has had lower abdominal and pelvic pain for approximately a year. She states that she has had right upper quadrant pain for approximately one week and was seen here in all labs were normal and she had a CT scan was unremarkable. She was given pain medication and shortly to follow up with her OB. She states that she was scheduled to have an exploratory laparoscopy for concern of endometriosis with gynecology tomorrow, the surgeon had a family emergency and had to cancel surgery. She states that she started developing right flank pain this morning which is new. Denies Cp, SOB, Ha, Fevers. Endorses mild nausea, denies vomiting. He also endorses white vaginal discharge and redness and swelling of her labia.  Past Medical History:  Diagnosis Date  . Anxiety   . Asthma   . Back pain   . Ovarian cyst     Patient Active Problem List   Diagnosis Date Noted  . Right lower quadrant abdominal pain 10/31/2016  . Family history of endometriosis in first degree relative 10/31/2016  . Female dyspareunia 10/31/2016  . Dysmenorrhea 10/31/2016  . Pelvic pain 10/31/2016  . Chronic pelvic pain in female 03/16/2016  . Generalized anxiety disorder 03/02/2016  . Oligo-ovulation 11/17/2015    Past Surgical History:  Procedure Laterality Date  . LAPAROSCOPY    . TONSILLECTOMY      OB History    Gravida Para Term Preterm AB Living   0 0 0 0 0 0   SAB TAB Ectopic Multiple Live Births   0 0 0 0 0       Home Medications    Prior to Admission medications   Medication Sig Start Date End Date Taking?  Authorizing Provider  Norethindrone-Ethinyl Estradiol-Fe Biphas (LO LOESTRIN FE) 1 MG-10 MCG / 10 MCG tablet Take 1 tablet by mouth daily. 07/16/16  Yes Historical Provider, MD  ondansetron (ZOFRAN-ODT) 8 MG disintegrating tablet Take 1 tablet (8 mg total) by mouth every 8 (eight) hours as needed for nausea. 11/18/16  Yes Rise Mu, PA-C  oxyCODONE-acetaminophen (PERCOCET/ROXICET) 5-325 MG tablet Take 1-2 tablets by mouth every 6 (six) hours as needed for severe pain. Patient taking differently: Take 1 tablet by mouth every 6 (six) hours as needed for severe pain.  11/18/16  Yes Rise Mu, PA-C  promethazine (PHENERGAN) 25 MG tablet Take 1 tablet (25 mg total) by mouth every 8 (eight) hours as needed for nausea or vomiting. Patient taking differently: Take 25 mg by mouth every 6 (six) hours as needed for nausea or vomiting.  11/15/16  Yes Prentice Docker Defrancesco, MD  doxycycline (VIBRAMYCIN) 100 MG capsule Take 1 capsule (100 mg total) by mouth 2 (two) times daily. 12/03/16 12/17/16  Cheronda Erck [place] Mikal Blasdell, MD  metroNIDAZOLE (FLAGYL) 500 MG tablet Take 1 tablet (500 mg total) by mouth 2 (two) times daily. 12/03/16 12/17/16  Dehlia Kilner [place] Cecillia Menees, MD  naproxen (NAPROSYN) 500 MG tablet Take 1 tablet (500 mg total) by mouth 2 (two) times daily. 12/02/16   Barrett Henle, PA-C  oxyCODONE-acetaminophen (PERCOCET/ROXICET) 5-325 MG tablet  Take 1 tablet by mouth every 4 (four) hours as needed for severe pain. 12/01/16   Melody Suzan Nailer, CNM    Family History Family History  Problem Relation Age of Onset  . Diabetes Mother   . Diabetes Maternal Grandmother   . Heart disease Maternal Grandmother     GRT ALSO  . Breast cancer Paternal Grandmother     GRT  . Ovarian cancer Neg Hx   . Colon cancer Neg Hx     Social History Social History  Substance Use Topics  . Smoking status: Never Smoker  . Smokeless tobacco: Former Neurosurgeon  . Alcohol use Yes     Comment: RARE     Allergies   Tramadol;  Hydrocodone; and Morphine and related   Review of Systems Review of Systems  Constitutional: Negative.   HENT: Negative.   Respiratory: Negative for cough and shortness of breath.   Cardiovascular: Negative for chest pain and palpitations.  Gastrointestinal: Positive for abdominal pain and nausea. Negative for abdominal distention, constipation, diarrhea and vomiting.  Genitourinary: Positive for difficulty urinating, flank pain, pelvic pain, vaginal discharge and vaginal pain. Negative for hematuria and vaginal bleeding.  Musculoskeletal: Negative for back pain and neck pain.  Skin: Negative.   Neurological: Negative for weakness, numbness and headaches.  All other systems reviewed and are negative.    Physical Exam Updated Vital Signs BP 140/96 (BP Location: Right Arm)   Pulse (!) 128   Temp 99.2 F (37.3 C) (Oral)   Resp 22   Ht 5\' 4"  (1.626 m)   Wt 63.5 kg   LMP 11/04/2016 Comment: HCG neg 12/01/16  SpO2 99%   BMI 24.03 kg/m   Physical Exam  Constitutional: She appears well-developed and well-nourished. No distress.  HENT:  Head: Normocephalic and atraumatic.  Eyes: Conjunctivae are normal.  Neck: Neck supple.  Cardiovascular: Normal rate and regular rhythm.   No murmur heard. Pulmonary/Chest: Effort normal and breath sounds normal. No respiratory distress.  Abdominal: Soft. She exhibits no distension. There is generalized tenderness (Worse in B/l lower quandrants and RUQ). There is CVA tenderness (R).  Genitourinary: There is rash and tenderness on the right labia. There is rash and tenderness on the left labia.  Genitourinary Comments: Mild erythema noted to b/l labia, no skin disruption or desquamation.   Musculoskeletal: She exhibits no edema.  Neurological: She is alert.  Skin: Skin is warm and dry.  Psychiatric: She has a normal mood and affect.  Nursing note and vitals reviewed.    ED Treatments / Results  Labs (all labs ordered are listed, but only  abnormal results are displayed) Labs Reviewed  WET PREP, GENITAL - Abnormal; Notable for the following:       Result Value   WBC, Wet Prep HPF POC FEW (*)    All other components within normal limits  URINALYSIS, ROUTINE W REFLEX MICROSCOPIC - Abnormal; Notable for the following:    Color, Urine STRAW (*)    All other components within normal limits  COMPREHENSIVE METABOLIC PANEL - Abnormal; Notable for the following:    ALT 10 (*)    Alkaline Phosphatase 37 (*)    All other components within normal limits  CBC WITH DIFFERENTIAL/PLATELET  LIPASE, BLOOD  POC URINE PREG, ED  GC/CHLAMYDIA PROBE AMP ([place]) NOT AT [facility]    EKG  EKG Interpretation None       Radiology Ct Abdomen Pelvis W Contrast  Result Date: 12/01/2016 CLINICAL DATA:  26 y/o  F; right upper quadrant pain. EXAM: CT ABDOMEN AND PELVIS WITH CONTRAST TECHNIQUE: Multidetector CT imaging of the abdomen and pelvis was performed using the standard protocol following bolus administration of intravenous contrast. CONTRAST:  100mL ISOVUE-300 IOPAMIDOL (ISOVUE-300) INJECTION 61% COMPARISON:  12/22/2014 CT abdomen and pelvis. FINDINGS: Lower chest: No acute abnormality. Hepatobiliary: No focal liver abnormality is seen. No gallstones, gallbladder wall thickening, or biliary dilatation. Pancreas: Unremarkable. No pancreatic ductal dilatation or surrounding inflammatory changes. Spleen: 13 mm splenule posterior and inferior to spleen. Adrenals/Urinary Tract: Adrenal glands are unremarkable. Kidneys are normal, without renal calculi, focal lesion, or hydronephrosis. Bladder is unremarkable. Stomach/Bowel: Stomach is within normal limits. Appendix appears normal. No evidence of bowel wall thickening, distention, or inflammatory changes. Vascular/Lymphatic: No significant vascular findings are present. No enlarged abdominal or pelvic lymph nodes. Reproductive: Uterus and bilateral adnexa are unremarkable. Other: No abdominal wall hernia  or abnormality. No abdominopelvic ascites. Musculoskeletal: No acute or significant osseous findings. IMPRESSION: No acute process identified as explanation for pain. Previously identified punctate kidney stones are not appreciated. Unremarkable CT of abdomen and pelvis for age. Electronically Signed   By: Mitzi HansenLance  Furusawa-Stratton M.D.   On: 12/01/2016 23:35    Procedures Procedures (including critical care time)  Medications Ordered in ED Medications  lidocaine (PF) (XYLOCAINE) 1 % injection (not administered)  ondansetron (ZOFRAN) injection 4 mg (4 mg Intravenous Given 12/03/16 1830)  HYDROmorphone (DILAUDID) injection 1 mg (1 mg Intravenous Given 12/03/16 1830)  ondansetron (ZOFRAN) injection 4 mg (4 mg Intravenous Given 12/03/16 2003)  HYDROmorphone (DILAUDID) injection 0.5 mg (0.5 mg Intravenous Given 12/03/16 2004)  cefTRIAXone (ROCEPHIN) injection 250 mg (250 mg Intramuscular Given 12/03/16 2102)  metroNIDAZOLE (FLAGYL) tablet 500 mg (500 mg Oral Given 12/03/16 2102)  HYDROmorphone (DILAUDID) injection 1 mg (1 mg Intravenous Given 12/03/16 2102)  ondansetron (ZOFRAN) injection 4 mg (4 mg Intravenous Given 12/03/16 2102)     Initial Impression / Assessment and Plan / ED Course  I have reviewed the triage vital signs and the nursing notes.  Pertinent labs & imaging results that were available during my care of the patient were reviewed by me and considered in my medical decision making (see chart for details).    26 year old female with a history of chronic abdominal and pelvic pain for the past year with 1 week of worsening right upper quadrant pain and 1 day of right flank pain. She denies any fevers. She has had recent visits to the emergency department for this pain. She was seen 2 days ago and had a CT scan and multiple lab work was all that was unremarkable she also had a pelvic ultrasound on January 6 that showed ovarian cysts. She states she was treated for PID in over a month ago but  she does endorse worsening vaginal discharge. On exam she is afebrile and hemodynamically stable. Diffuse abdominal tenderness that is worse in the right upper quadrant and CVA tenderness on the right. She has purulent discharge from her cervix with CMT.  EKG was normal sinus rhythm. CBC, CMP, lipase, Ua, wet prep and GC chlamydia ordered. Dilaudid for pain. Zofran for nausea.  CBC is unremarkable with no leukocytosis. CMP unremarkable. Lipase 22. UA is negative for uti. Wet prep with few WBCs. Past labs are normal and she just had a CT scan 2 days ago, further imaging deferred at this time. Exam consistent was PID. Pt given IM rocephin and RX for doxycycline and flagyl. She is instructed to follow up with  her surgeon this was from the laparoscopy for endometriosis and chronic pelvic pain. I queried the narcotic database she has received 58 percocet since january 6 therefore no narcotic prescription was given for her pain. she was counseled on opiate abuse and states that she is only taking them because she is in severe pain. she was instructed to follow-up with her primary care provider next available appointment.  Patient care discussed and supervised by my attending, Dr. Jodi Mourning. Azalia Bilis, MD   Final Clinical Impressions(s) / ED Diagnoses   Final diagnoses:  Lower abdominal pain  PID (acute pelvic inflammatory disease)    New Prescriptions New Prescriptions   DOXYCYCLINE (VIBRAMYCIN) 100 MG CAPSULE    Take 1 capsule (100 mg total) by mouth 2 (two) times daily.   METRONIDAZOLE (FLAGYL) 500 MG TABLET    Take 1 tablet (500 mg total) by mouth 2 (two) times daily.     Darleen Moffitt [place] Kelechi Orgeron, MD 12/03/16 1610    Blane Ohara, MD 12/04/16 [phone]

## 2016-12-04 ENCOUNTER — Encounter: Payer: Self-pay | Admitting: Obstetrics and Gynecology

## 2016-12-04 ENCOUNTER — Ambulatory Visit
Admission: RE | Admit: 2016-12-04 | Payer: BLUE CROSS/BLUE SHIELD | Source: Ambulatory Visit | Admitting: Obstetrics and Gynecology

## 2016-12-04 ENCOUNTER — Ambulatory Visit (INDEPENDENT_AMBULATORY_CARE_PROVIDER_SITE_OTHER): Payer: BLUE CROSS/BLUE SHIELD | Admitting: Obstetrics and Gynecology

## 2016-12-04 ENCOUNTER — Telehealth: Payer: Self-pay | Admitting: Obstetrics and Gynecology

## 2016-12-04 ENCOUNTER — Encounter: Admission: RE | Payer: Self-pay | Source: Ambulatory Visit

## 2016-12-04 VITALS — BP 133/86 | HR 96 | Temp 97.9°F | Ht 64.0 in | Wt 133.6 lb

## 2016-12-04 DIAGNOSIS — R102 Pelvic and perineal pain: Secondary | ICD-10-CM | POA: Diagnosis not present

## 2016-12-04 LAB — I-STAT BETA HCG BLOOD, ED (MC, WL, AP ONLY): I-stat hCG, quantitative: <5 m[IU]/mL (ref <5)

## 2016-12-04 LAB — POCT URINALYSIS DIPSTICK
Bilirubin, UA: NEGATIVE
GLUCOSE UA: NEGATIVE
Ketones, UA: NEGATIVE
NITRITE UA: NEGATIVE
PH UA: 6
PROTEIN UA: NEGATIVE
Spec Grav, UA: <=1.005
UROBILINOGEN UA: NEGATIVE

## 2016-12-04 LAB — GC/CHLAMYDIA PROBE AMP (~~LOC~~) NOT AT ARMC
CHLAMYDIA, DNA PROBE: NEGATIVE
Neisseria Gonorrhea: NEGATIVE

## 2016-12-04 SURGERY — LAPAROSCOPY, DIAGNOSTIC
Anesthesia: General

## 2016-12-04 MED ORDER — MECLOFENAMATE SODIUM 100 MG PO CAPS: 100.0000 mg | ORAL_CAPSULE | Freq: Three times a day (TID) | ORAL | 0 refills | Status: DC

## 2016-12-04 NOTE — Telephone Encounter (Signed)
Pt has taken all but 1 percocet. She took one at 10:30am with no relief. Pain is at a 10 now. Pt is crying and very upset.  Dx with pid at er. Given rocephin im. Flagyl and doxycycline. Had not gotten meds filled yet. Appt made today with DJE- 4:00.

## 2016-12-04 NOTE — Progress Notes (Signed)
HPI:      [name] is a 26 y.o. G0P0000 who LMP was Patient's last menstrual period was 11/04/2016..  Subjective: She presents today after being seen in the emergency department yesterday. She was diagnosed with PID and pelvic pain. She is currently taking antibiotics. She is also taking Percocet which she says does not really for pain. Her GC and CT in the emergency department were negative. She had a CAT scan which showed no abnormalities. She states that she was treated 1 month ago for PID and this did nothing to resolve her symptoms. She is currently taking OCPs. She believes that she may have endometriosis (her mother had this) and she strongly desires surgery for diagnosis. She reports labial and vaginal " swelling and sloughing."     Hx: The following portions of the patient's history were reviewed and updated as appropriate:            She  has a past medical history of Anxiety; Asthma; Back pain; Ovarian cyst; and PID (pelvic inflammatory disease). She  does not have any pertinent problems on file. She  has a past surgical history that includes Tonsillectomy and laparoscopy. Current Outpatient Prescriptions on File Prior to Visit  Medication Sig Dispense Refill  . doxycycline (VIBRAMYCIN) 100 MG capsule Take 1 capsule (100 mg total) by mouth 2 (two) times daily. 28 capsule 0  . metroNIDAZOLE (FLAGYL) 500 MG tablet Take 1 tablet (500 mg total) by mouth 2 (two) times daily. 28 tablet 0  . naproxen (NAPROSYN) 500 MG tablet Take 1 tablet (500 mg total) by mouth 2 (two) times daily. 30 tablet 0  . Norethindrone-Ethinyl Estradiol-Fe Biphas (LO LOESTRIN FE) 1 MG-10 MCG / 10 MCG tablet Take 1 tablet by mouth daily.    . ondansetron (ZOFRAN-ODT) 8 MG disintegrating tablet Take 1 tablet (8 mg total) by mouth every 8 (eight) hours as needed for nausea. 12 tablet 0  . oxyCODONE-acetaminophen (PERCOCET/ROXICET) 5-325 MG tablet Take 1-2 tablets by mouth every 6 (six) hours as needed  for severe pain. (Patient taking differently: Take 1 tablet by mouth every 6 (six) hours as needed for severe pain. ) 8 tablet 0  . promethazine (PHENERGAN) 25 MG tablet Take 1 tablet (25 mg total) by mouth every 8 (eight) hours as needed for nausea or vomiting. (Patient taking differently: Take 25 mg by mouth every 6 (six) hours as needed for nausea or vomiting. ) 15 tablet 0   No current facility-administered medications on file prior to visit.           ROS: Constitutional: Denied constitutional symptoms, night sweats, recent illness, fatigue, fever, insomnia and weight loss.  Eyes: Denied eye symptoms, eye pain, photophobia, vision change and visual disturbance.  Ears/Nose/Throat/Neck: Denied ear, nose, throat or neck symptoms, hearing loss, nasal discharge, sinus congestion and sore throat.  Cardiovascular: Denied cardiovascular symptoms, arrhythmia, chest pain/pressure, edema, exercise intolerance, orthopnea and palpitations.  Respiratory: Denied pulmonary symptoms, asthma, pleuritic pain, productive sputum, cough, dyspnea and wheezing.  Gastrointestinal: Denied, gastro-esophageal reflux, melena, nausea and vomiting.  Genitourinary: See HPI for additional information.  Musculoskeletal: Denied musculoskeletal symptoms, stiffness, swelling, muscle weakness and myalgia.  Dermatologic: Denied dermatology symptoms, rash and scar.  Neurologic: Denied neurology symptoms, dizziness, headache, neck pain and syncope.  Psychiatric: Denied psychiatric symptoms, anxiety and depression.  Endocrine: Denied endocrine symptoms including hot flashes and night sweats.     Objective: Vitals:   12/04/16 1612  BP: 133/86  Pulse: 96  Temp:  97.9 F (36.6 C)             Physical examination   Pelvic:   Vulva: Normal appearance.  No lesions.  Vagina: No lesions or abnormalities noted.  Support: Normal pelvic support.  Urethra No masses tenderness or scarring.  Meatus Normal size without lesions or  prolapse.  Cervix: Normal appearance.  No lesions.  Anus: Normal exam.  No lesions.  Perineum: Normal exam.  No lesions.        Bimanual   Uterus: Normal size.  Non-tender.  Mobile.  AV.  Adnexae: No masses.  Non-tender to palpation.  Cul-de-sac: Negative for abnormality.   Of significant note-no evidence of erythema or swelling or sloughing skin.  Assessment: Pelvic pain - Plan: POCT urinalysis dipstick, Urine culture, meclofenamate (MECLOMEN) 100 MG capsule Other than the patient's complaint of abdominal pain, pelvic pain, vaginal pain, vaginal swelling, I can find nothing wrong with her on exam today or on notes and CT from emergency department.  Plan:            Orders Orders Placed This Encounter  Procedures  . Urine culture  . POCT urinalysis dipstick    1.  Will prescribe Meclomen. Advised follow-up for possible preop next week with Dr. Algis Downs.        F/U  Return in about 1 week (around 12/11/2016).  Elonda Husky, M.D. 12/04/2016 5:09 PM   Addendum: When the patient was getting dressed to leave she reports that she fell and hit her head after feeling lightheaded. She states that her right arm became numb and her legs became numb and she was unable to stand. The nurses tended to her and I spoke with her regarding a return to the emergency department because of her fall. She refused to go there. Her mother is considering a visit to a different hospital system because they are disillusioned with [facility]. I strongly advise them to resent to the emergency department because I felt that if she indeed had arm and leg numbness and was passing out all up was warranted. They again declined this.

## 2016-12-04 NOTE — Telephone Encounter (Signed)
PT MOTHER CALLED AND SAID SHE TOOK Abigail Harding TO ER  LAST NIGHT. THEY SAID SHE PID. SHE IS IN A LOT OF PAIN AND HER LABIA IS SWOLLEN. SHE WANTS HER TO SEE A DR YESTERDAY!! THE ER GAVE HER ANTIBIOTICS AND PAIN MED. SHE WANTS YOUR ADVICE CRYSTAL.

## 2016-12-06 LAB — URINE CULTURE

## 2016-12-18 ENCOUNTER — Telehealth: Payer: Self-pay | Admitting: Obstetrics and Gynecology

## 2016-12-18 NOTE — Telephone Encounter (Signed)
FYI: PT CALLED AND STILL HAS NOT RECEIVED A CALL BACK FROM US ABOUT WHEN HER SURGERY WILL BE SINCE IT WAS CX DUE TO DR DE'S FATHER, SHE ALSO WANTED TO LET US KNOW THAT SHE HAS STOPPED TAKING ALL THE MEDICATIONS THAT WERE PRESCRIBED TO HER SINCE SHE HAD BLOOD IN HER URINE AND SHE IS SURE THAT THE ENDOMETRIOSIS MEDICINE WAS CAUSING THAT, THE ONLY THING SHE IS TAKING IT THE PAIN MEDS.

## 2016-12-19 NOTE — Telephone Encounter (Signed)
Done. Patient is scheduled for surgery 2/12. Patient notified.

## 2016-12-20 ENCOUNTER — Encounter: Payer: Self-pay | Admitting: Obstetrics and Gynecology

## 2016-12-20 ENCOUNTER — Ambulatory Visit (INDEPENDENT_AMBULATORY_CARE_PROVIDER_SITE_OTHER): Payer: BLUE CROSS/BLUE SHIELD | Admitting: Obstetrics and Gynecology

## 2016-12-20 VITALS — BP 117/66 | HR 88 | Ht 64.0 in | Wt 131.6 lb

## 2016-12-20 DIAGNOSIS — Z01818 Encounter for other preprocedural examination: Secondary | ICD-10-CM

## 2016-12-20 DIAGNOSIS — N946 Dysmenorrhea, unspecified: Secondary | ICD-10-CM

## 2016-12-20 DIAGNOSIS — Z842 Family history of other diseases of the genitourinary system: Secondary | ICD-10-CM

## 2016-12-20 DIAGNOSIS — R102 Pelvic and perineal pain: Secondary | ICD-10-CM

## 2016-12-20 DIAGNOSIS — R319 Hematuria, unspecified: Secondary | ICD-10-CM

## 2016-12-20 LAB — POCT URINALYSIS DIPSTICK
BILIRUBIN UA: NEGATIVE
GLUCOSE UA: NEGATIVE
KETONES UA: NEGATIVE
Leukocytes, UA: NEGATIVE
Nitrite, UA: NEGATIVE
Protein, UA: NEGATIVE
SPEC GRAV UA: 1.01
UROBILINOGEN UA: NEGATIVE
pH, UA: 7.5

## 2016-12-20 MED ORDER — PROMETHAZINE HCL 25 MG PO TABS: 25.0000 mg | ORAL_TABLET | Freq: Three times a day (TID) | ORAL | 0 refills | Status: DC | PRN

## 2016-12-20 NOTE — Patient Instructions (Signed)
1. Return in 1 week after surgery for postop check  Diagnostic Laparoscopy A diagnostic laparoscopy is a procedure to diagnose diseases in the abdomen. During the procedure, a thin, lighted, pencil-sized instrument called a laparoscope is inserted into the abdomen through an incision. The laparoscope allows your health care provider to look at the organs inside your body. Tell a health care provider about:  Any allergies you have.  All medicines you are taking, including vitamins, herbs, eye drops, creams, and over-the-counter medicines.  Any problems you or family members have had with anesthetic medicines.  Any blood disorders you have.  Any surgeries you have had.  Any medical conditions you have. What are the risks? Generally, this is a safe procedure. However, problems can occur, which may include:  Infection.  Bleeding.  Damage to other organs.  Allergic reaction to the anesthetics used during the procedure. What happens before the procedure?  Do not eat or drink anything after midnight on the night before the procedure or as directed by your health care provider.  Ask your health care provider about:  Changing or stopping your regular medicines.  Taking medicines such as aspirin and ibuprofen. These medicines can thin your blood. Do not take these medicines before your procedure if your health care provider instructs you not to.  Plan to have someone take you home after the procedure. What happens during the procedure?  You may be given a medicine to help you relax (sedative).  You will be given a medicine to make you sleep (general anesthetic).  Your abdomen will be inflated with a gas. This will make your organs easier to see.  Small incisions will be made in your abdomen.  A laparoscope and other small instruments will be inserted into the abdomen through the incisions.  A tissue sample may be removed from an organ in the abdomen for examination.  The  instruments will be removed from the abdomen.  The gas will be released.  The incisions will be closed with stitches (sutures). What happens after the procedure? Your blood pressure, heart rate, breathing rate, and blood oxygen level will be monitored often until the medicines you were given have worn off. This information is not intended to replace advice given to you by your health care provider. Make sure you discuss any questions you have with your health care provider. Document Released: 02/05/2001 Document Revised: 03/09/2016 Document Reviewed: 06/12/2014 Elsevier Interactive Patient Education  2017 [employer]

## 2016-12-20 NOTE — Progress Notes (Signed)
Subjective:  PREOPERATIVE HISTORY AND PHYSICAL   Date of surgery: 12/25/2016 Procedure: Laparoscopy with peritoneal biopsies Diagnoses: 1. Chronic abdominal pelvic pain 2. Dysmenorrhea 3. Dyspareunia   Patient is a 26 y.o. G0P0053female scheduled for laparoscopy procedure. Indications for procedure are chronic pelvic and RLQ abdominal pain, dysmenorrhea, & dyspareunia.  1. PreOp Exam: Pt. stopped OCP d/t reading online that pills cause ovarian cysts; pt. stopped meclofenamate sodium d/t nausea. Pt. Has stopped smoking marijuana.  Of note, 12/18/16, episode before bowel movement: body felt numb & and pt lost consciousness. Confusion upon regaining consciousness. Ensuing large bowel movement & corresponding vasovagal reflex. Cramping in lower pelvic area. Mother checked glucose 1.5h post meal - 134. Pt. Reports chronic history of migraines and hematuria. Finished course of doxycycline and metronidazole & ceftriaxone injection prescribed by ED 1/21/1. H/o L ovarian cyst. Has lost two pounds since last visit.    Pertinent Gynecological History: 10/25/16 ultrasound revealed 1.8 cm simple cyst otherwise nml. 11/17/15 laparoscopy for ruptured ovarian cyst w/o endometriosis dx. Menarche Age 57 with oligomenorrhea (les than 10 / year)  LMP: 11/04/16 Contraception: none. Stopped OCP d/t reading that "they may cause ovarian cysts." Last mammogram: n/a  Last pap: n/a  Discussed Blood/Blood Products: no   Menstrual History: OB History    Gravida Para Term Preterm AB Living   0 0 0 0 0 0   SAB TAB Ectopic Multiple Live Births   0 0 0 0 0      Menarche age: 33 Patient's last menstrual period was 11/04/2016 (exact date).    Past Medical History:  Diagnosis Date  . Anxiety   . Asthma   . Back pain   . Ovarian cyst   . PID (pelvic inflammatory disease)     Past Surgical History:  Procedure Laterality Date  . LAPAROSCOPY    . TONSILLECTOMY      OB History  Gravida Para Term Preterm  AB Living  0 0 0 0 0 0  SAB TAB Ectopic Multiple Live Births  0 0 0 0 0        Social History   Social History  . Marital status: Single    Spouse name: N/A  . Number of children: N/A  . Years of education: N/A   Social History Main Topics  . Smoking status: Never Smoker  . Smokeless tobacco: Former Neurosurgeon  . Alcohol use Yes     Comment: RARE  . Drug use: Yes    Types: Marijuana     Comment: DAILY- X 4 YEARS  . Sexual activity: Yes    Birth control/ protection: None   Other Topics Concern  . None   Social History Narrative  . None    Family History  Problem Relation Age of Onset  . Diabetes Mother   . Diabetes Maternal Grandmother   . Heart disease Maternal Grandmother     GRT ALSO  . Breast cancer Paternal Grandmother     GRT  . Ovarian cancer Neg Hx   . Colon cancer Neg Hx      (Not in a hospital admission)  Allergies  Allergen Reactions  . Tramadol Nausea And Vomiting and Other (See Comments)    cramping  . Hydrocodone Itching  . Morphine And Related Itching    Review of Systems Constitutional: No recent fever/chills/sweats Respiratory: No recent cough/bronchitis Cardiovascular: No chest pain Gastrointestinal: No recent nausea/vomiting/diarrhea Genitourinary: No UTI symptoms Hematologic/lymphatic:No history of coagulopathy or recent blood thinner use  Objective:    BP 117/66   Pulse 88   Ht 5\' 4"  (1.626 m)   Wt 131 lb 9.6 oz (59.7 kg)   LMP 11/04/2016 (Exact Date)   BMI 22.59 kg/m   General:   Normal  Skin:   normal  HEENT:  Normal  Neck:  Supple without Adenopathy or Thyromegaly  Lungs:   Heart:              Breasts:   Abdomen:  Pelvis:  M/S   Extremeties:  Neuro:    clear to auscultation bilaterally   Normal without murmur   Not Examined   soft, non-tender; bowel sounds normal; no masses,  no organomegaly   Exam deferred to OR  No CVAT  Warm/Dry   Normal          10/31/2016 PELVIC:             External  Genitalia: Normal             BUS: Normal             Vagina: Normal             Cervix: no lesions; mild cervical motion tenderness             Uterus: Normal size, shape,consistency, mobile, 2/4 tender             Adnexa:nonpalpable, mild tenderness right greater than left             RV: Normal external exam             Bladder: Nontender Assessment:    1. Chronic pelvic pain 2. Dysmenorrhea 3. Dyspareunia     Plan:  Laparoscopy with peritoneal biopsies  1. Counseling: Laparoscopy procedure, risks, reasons, benefits and complications (including injury to bowel, bladder, major blood vessel, ureter, bleeding, possibility of transfusion, infection, or fistula formation) reviewed in detail.  2. Consent signed. 3. Preop testing ordered. 4. Instructions reviewed, including NPO after midnight. 5. F/u one week after surgery for post op check. 6. Order phenergan per pt. Request.   Marisue IvanJacquelyn Visser, PA-S Herold HarmsMartin A Harm Jou, MD   I have seen, interviewed, and examined the patient in conjunction with the [employer] and affirm the diagnosis and management plan. Senai Kingsley A. Skylee Baird, MD, FACOG   Note: This dictation was prepared with Dragon dictation along with smaller phrase technology. Any transcriptional errors that result from this process are unintentional.

## 2016-12-20 NOTE — Progress Notes (Deleted)
An Excellent Result from Her Physical EFFORT assessment Pelvic pain. Helping dysmenorrhea

## 2016-12-21 ENCOUNTER — Encounter
Admission: RE | Admit: 2016-12-21 | Discharge: 2016-12-21 | Disposition: A | Discharge status: Home / Self Care | Payer: BLUE CROSS/BLUE SHIELD | Source: Ambulatory Visit | Attending: Obstetrics and Gynecology | Admitting: Obstetrics and Gynecology

## 2016-12-21 ENCOUNTER — Encounter: Payer: Self-pay | Admitting: Internal Medicine

## 2016-12-21 ENCOUNTER — Ambulatory Visit (INDEPENDENT_AMBULATORY_CARE_PROVIDER_SITE_OTHER): Payer: BLUE CROSS/BLUE SHIELD | Admitting: Internal Medicine

## 2016-12-21 ENCOUNTER — Ambulatory Visit
Admission: RE | Admit: 2016-12-21 | Discharge: 2016-12-21 | Disposition: A | Discharge status: Home / Self Care | Payer: BLUE CROSS/BLUE SHIELD | Source: Ambulatory Visit | Attending: Internal Medicine | Admitting: Internal Medicine

## 2016-12-21 ENCOUNTER — Ambulatory Visit: Payer: BLUE CROSS/BLUE SHIELD | Admitting: Internal Medicine

## 2016-12-21 VITALS — BP 108/62 | HR 100 | Temp 98.6°F | Ht 64.0 in | Wt 132.8 lb

## 2016-12-21 DIAGNOSIS — R55 Syncope and collapse: Secondary | ICD-10-CM

## 2016-12-21 DIAGNOSIS — N926 Irregular menstruation, unspecified: Secondary | ICD-10-CM

## 2016-12-21 DIAGNOSIS — J323 Chronic sphenoidal sinusitis: Secondary | ICD-10-CM | POA: Diagnosis not present

## 2016-12-21 DIAGNOSIS — R102 Pelvic and perineal pain: Secondary | ICD-10-CM | POA: Diagnosis not present

## 2016-12-21 DIAGNOSIS — R51 Headache: Secondary | ICD-10-CM

## 2016-12-21 DIAGNOSIS — R519 Headache, unspecified: Secondary | ICD-10-CM | POA: Insufficient documentation

## 2016-12-21 DIAGNOSIS — F411 Generalized anxiety disorder: Secondary | ICD-10-CM

## 2016-12-21 HISTORY — DX: Personal history of urinary calculi: Z87.442

## 2016-12-21 HISTORY — DX: Attention-deficit hyperactivity disorder, unspecified type: F90.9

## 2016-12-21 HISTORY — DX: Headache, unspecified: R51.9

## 2016-12-21 HISTORY — DX: Anemia, unspecified: D64.9

## 2016-12-21 HISTORY — DX: Headache: R51

## 2016-12-21 LAB — RAPID HIV SCREEN (HIV 1/2 AB+AG)
HIV 1/2 ANTIBODIES: NONREACTIVE
HIV-1 P24 ANTIGEN - HIV24: NONREACTIVE

## 2016-12-21 LAB — CBC WITH DIFFERENTIAL/PLATELET
BASOS PCT: 1 %
Basophils Absolute: 0 10*3/uL (ref 0–0.1)
EOS ABS: 0.1 10*3/uL (ref 0–0.7)
EOS PCT: 2 %
HCT: 43.2 % (ref 35.0–47.0)
Hemoglobin: 14.6 g/dL (ref 12.0–16.0)
Lymphocytes Relative: 30 %
Lymphs Abs: 1.7 10*3/uL (ref 1.0–3.6)
MCH: 30.2 pg (ref 26.0–34.0)
MCHC: 33.8 g/dL (ref 32.0–36.0)
MCV: 89.5 fL (ref 80.0–100.0)
MONO ABS: 0.5 10*3/uL (ref 0.2–0.9)
MONOS PCT: 9 %
Neutro Abs: 3.4 10*3/uL (ref 1.4–6.5)
Neutrophils Relative %: 58 %
Platelets: 230 10*3/uL (ref 150–440)
RBC: 4.83 MIL/uL (ref 3.80–5.20)
RDW: 12.2 % (ref 11.5–14.5)
WBC: 5.8 10*3/uL (ref 3.6–11.0)

## 2016-12-21 LAB — TYPE AND SCREEN
ABO/RH(D): A POS
Antibody Screen: NEGATIVE

## 2016-12-21 LAB — POCT URINE PREGNANCY: PREG TEST UR: NEGATIVE

## 2016-12-21 IMAGING — CT CT HEAD W/O CM
1 series · 16 of 30 positions shown, 20 images · non-contrast
Comparison: None.

CLINICAL DATA: One month history of "Blackouts" headache.

EXAM:
CT HEAD WITHOUT CONTRAST
TECHNIQUE: Contiguous axial images were obtained from the base of the skull
through the vertex without intravenous contrast.

[Series 2: head wo · axial · 0.46mm/px · z∈[-110,+25]mm · 16 of 30 slices shown, 20 images]
[im 2/30  brain]
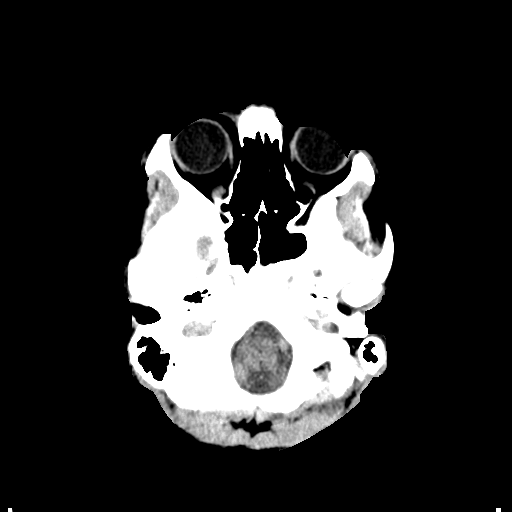
[im 2/30  bone]
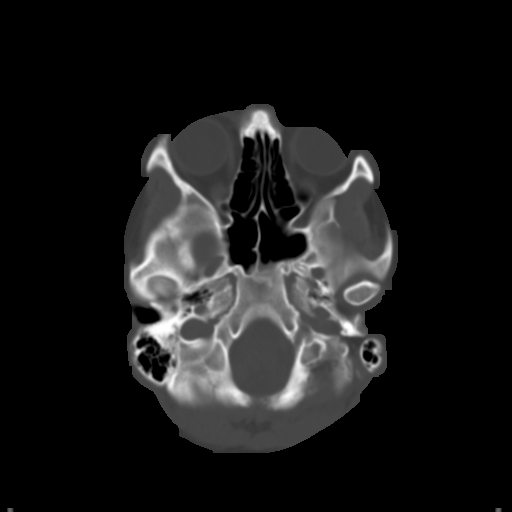
[im 4/30  brain]
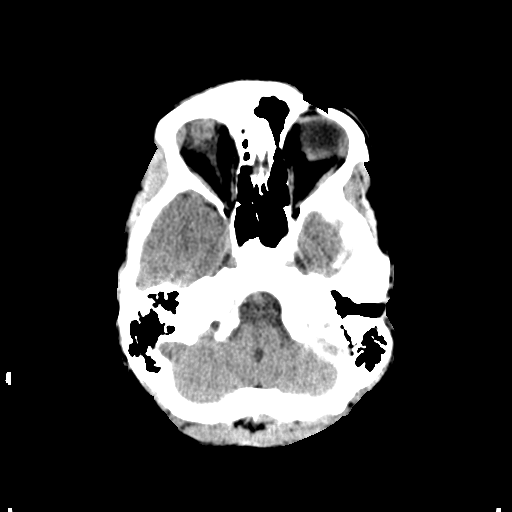
[im 6/30  brain]
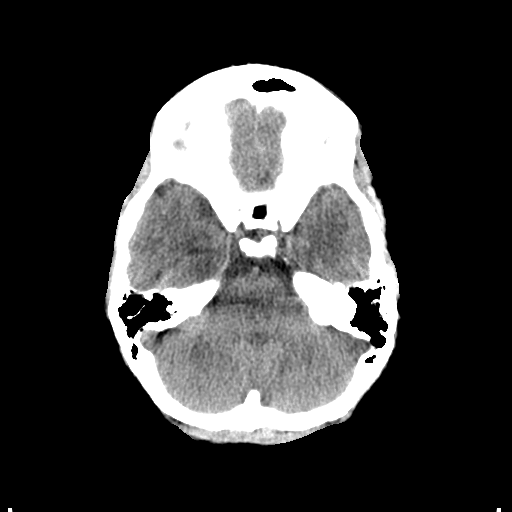
[im 8/30  brain]
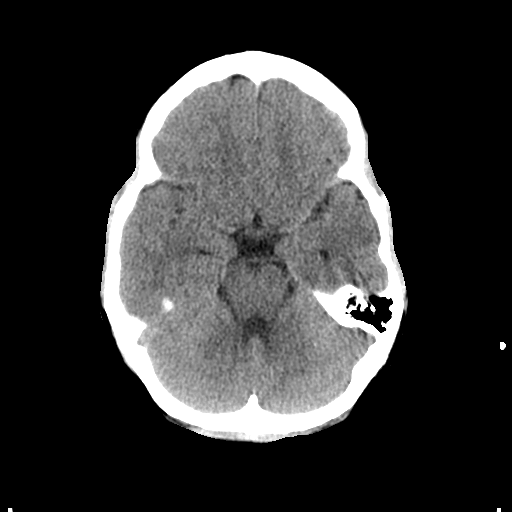
[im 9/30  brain]
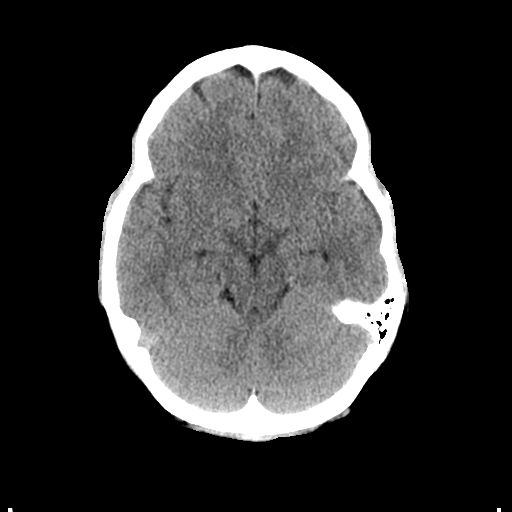
[im 9/30  bone]
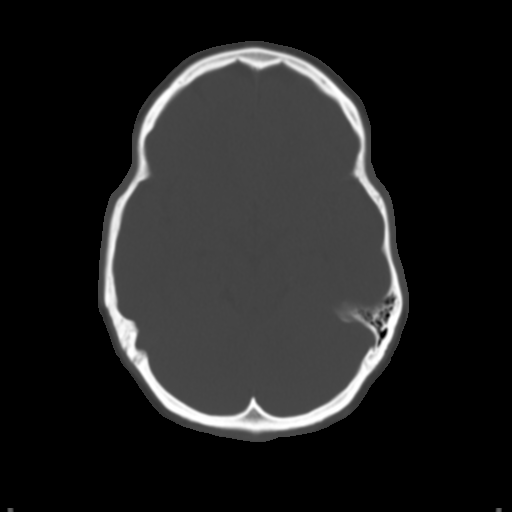
[im 11/30  brain]
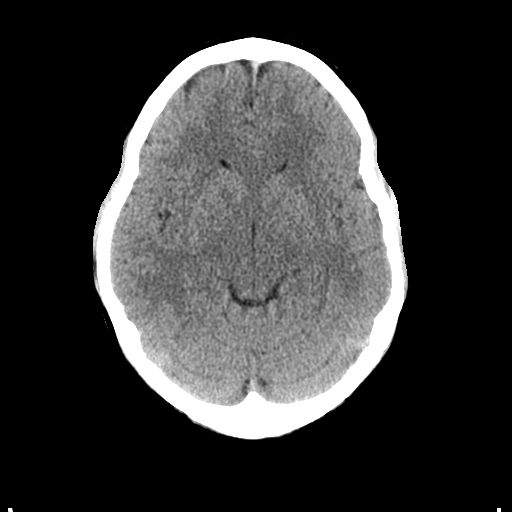
[im 13/30  brain]
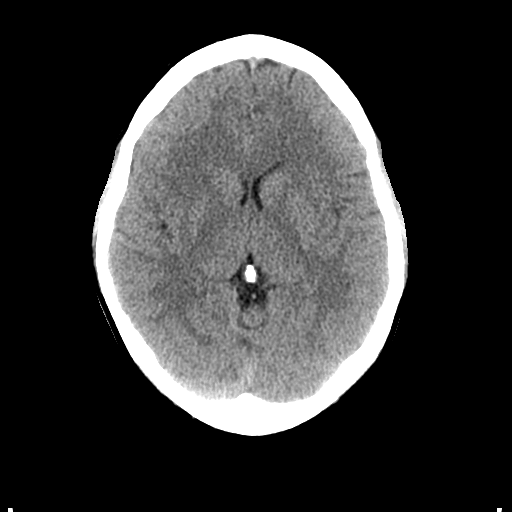
[im 15/30  brain]
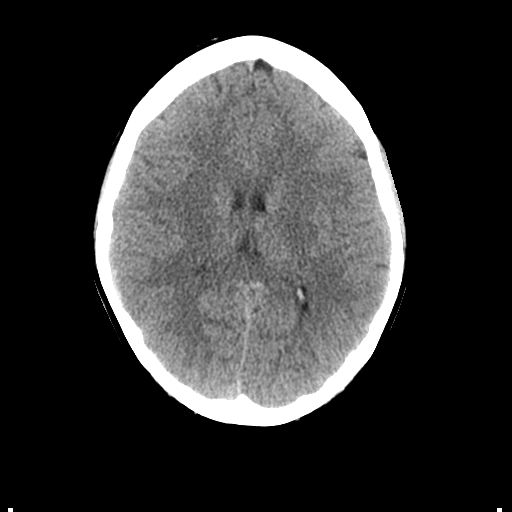
[im 16/30  brain]
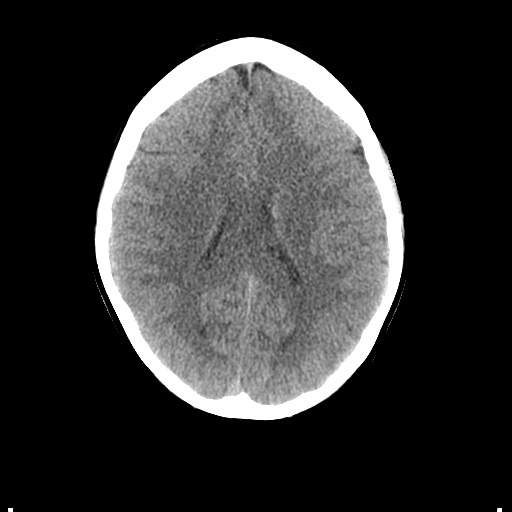
[im 16/30  bone]
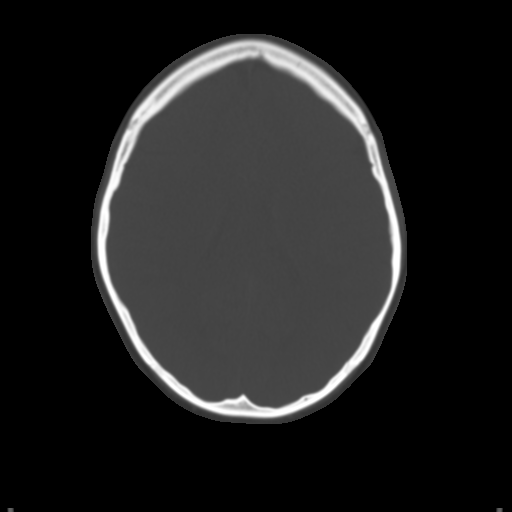
[im 18/30  brain]
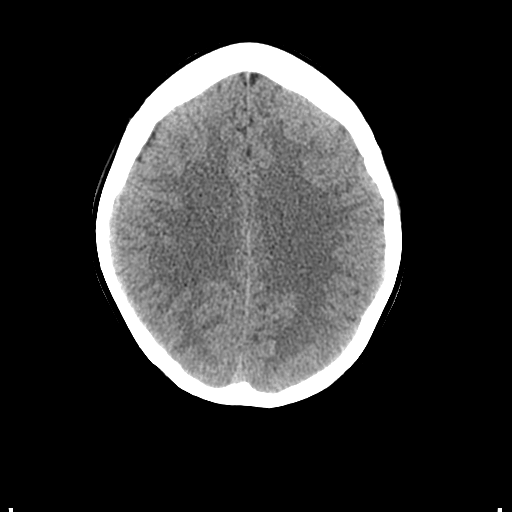
[im 20/30  brain]
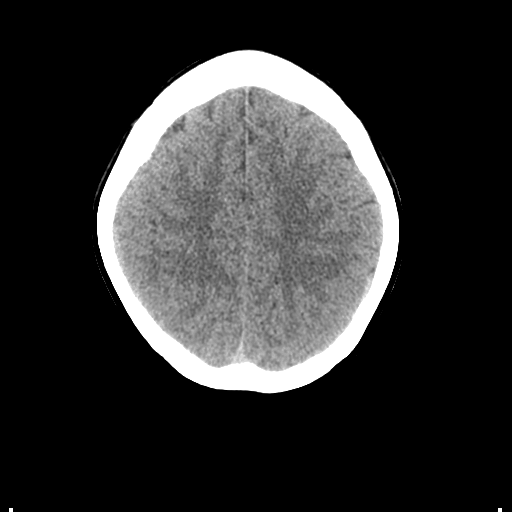
[im 22/30  brain]
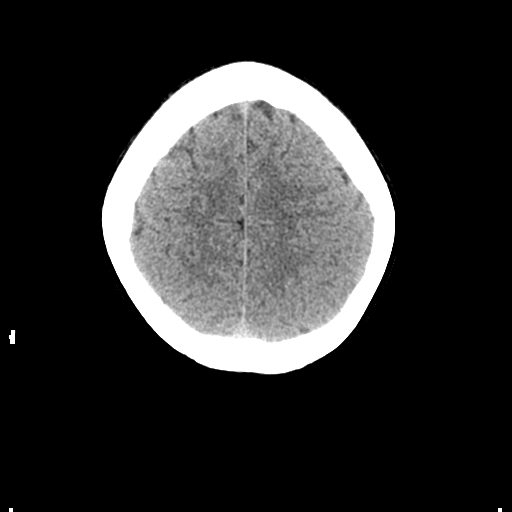
[im 23/30  brain]
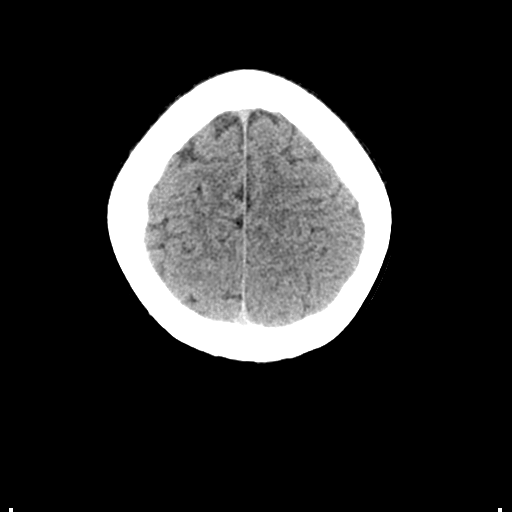
[im 23/30  bone]
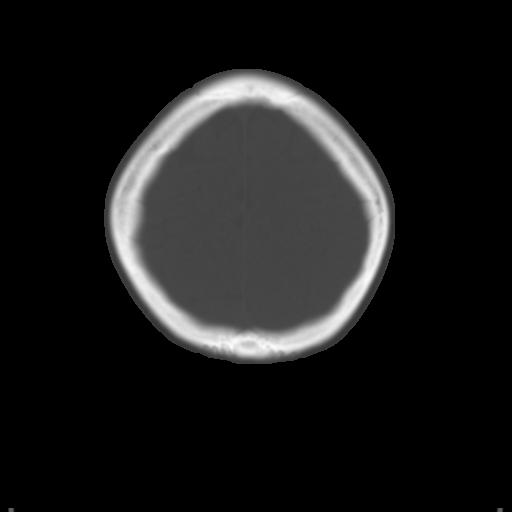
[im 25/30  brain]
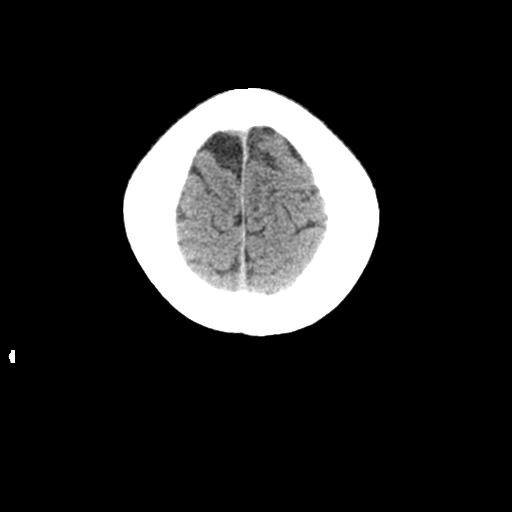
[im 27/30  brain]
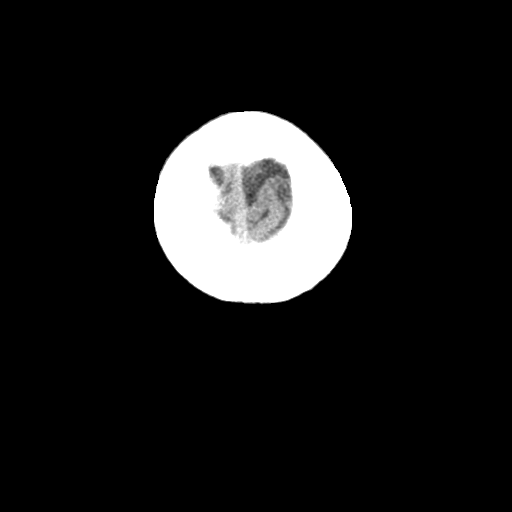
[im 29/30  brain]
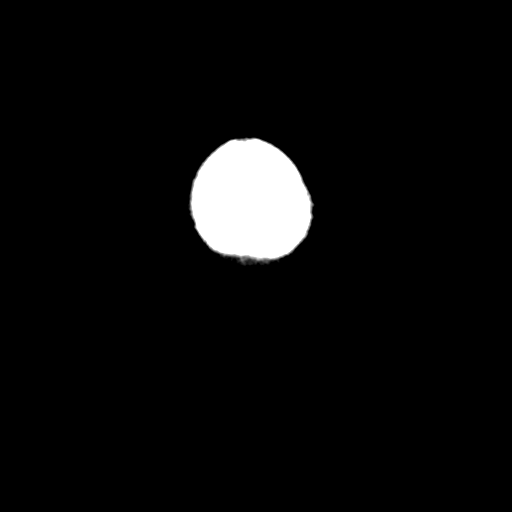

[16 of 30 positions shown; findings below may reference images not displayed]

FINDINGS: Brain: There is no evidence for acute hemorrhage, hydrocephalus,
mass lesion, or abnormal extra-axial fluid collection. No definite
CT evidence for acute infarction.

Vascular: No hyperdense vessel or unexpected calcification.

Skull: Normal. Negative for fracture or focal lesion.

Sinuses/Orbits: Mild mucosal thickening identified sphenoid sinuses.
The remaining visualized paranasal sinuses and mastoid air cells are
clear. Visualized portions of the globes and intraorbital fat are
unremarkable.

Other: None.
IMPRESSION: Trace chronic mucosal disease in the sphenoid sinuses. Otherwise
normal exam.

## 2016-12-21 NOTE — Patient Instructions (Signed)
  Your procedure is scheduled on: December 25, 2016 (Monday) Report to Same Day Surgery 2nd floor [facility](Medical Mall Entrance-take elevator on left to 2nd floor.  Check in with surgery information desk.) To find out your arrival time please call [phone] between 1PM - 3PM on December 22, 2016 (Friday)  Remember: Instructions that are not followed completely may result in serious medical risk, up to and including death, or upon the discretion of your surgeon and anesthesiologist your surgery may need to be rescheduled.    _x___ 1. Do not eat food or drink liquids after midnight. No gum chewing or hard candies.     __x__ 2. No Alcohol for 24 hours before or after surgery.   __x__3. No Smoking for 24 prior to surgery.   ____  4. Bring all medications with you on the day of surgery if instructed.    __x__ 5. Notify your doctor if there is any change in your medical condition     (cold, fever, infections).     Do not wear jewelry, make-up, hairpins, clips or nail polish.  Do not wear lotions, powders, or perfumes. You may wear deodorant.  Do not shave 48 hours prior to surgery. Men may shave face and neck.  Do not bring valuables to the hospital.    [facility] is not responsible for any belongings or valuables.               Contacts, dentures or bridgework may not be worn into surgery.  Leave your suitcase in the car. After surgery it may be brought to your room.  For patients admitted to the hospital, discharge time is determined by your treatment team.   Patients discharged the day of surgery will not be allowed to drive home.  You will need someone to drive you home and stay with you the night of your procedure.    Please read over the following fact sheets that you were given:   [facility] Preparing [facility] and or MRSA Information   ____ Take these medicines the morning of surgery with A SIP OF WATER:    1.   2.  3.  4.  5.  6.  ____Fleets enema or  Magnesium Citrate as directed.   _x___ Use CHG Soap or sage wipes as directed on instruction sheet   ____ Use inhalers on the day of surgery and bring to hospital day of surgery  ____ Stop metformin 2 days prior to surgery    ____ Take 1/2 of usual insulin dose the night before surgery and none on the morning of           surgery.   _x___ Stop Aspirin, Coumadin, Pllavix ,Eliquis, Effient, or Pradaxa (NO ASPIRIN)  x__ Stop Anti-inflammatories such as Advil, Aleve, Ibuprofen, Motrin, Naproxen,          Naprosyn, Goodies powders or aspirin products. Ok to take Tylenol.   ____ Stop supplements until after surgery.    ____ Bring C-Pap to the hospital.

## 2016-12-21 NOTE — Pre-Procedure Instructions (Signed)
Patient called Pre-Admit Testing and stated Dr. Lorin PicketScott ordered CT scan of head, results revealed normal exam.

## 2016-12-21 NOTE — Addendum Note (Signed)
Addended by: Sharon SellerEFRANCESCO, Lossie Kalp on: 12/21/2016 08:24 AM   Modules accepted: Orders, SmartSet

## 2016-12-21 NOTE — Progress Notes (Signed)
Patient ID: Abigail Harding, female   DOB: 07/11/1991, 26 y.o.   MRN: [id number]   Subjective:    Patient ID: Abigail Harding, female    DOB: 11/18/1990, 26 y.o.   MRN: [id number]  HPI  Patient here to establish care.  She is accompanied by her mother.  History obtained from both of them.  She has been having problems with lower abdominal pain.  Has a history of ovarian cysts.  Being followed by gyn.  Has surgery planned soon.  States on 12/18/16 had an episode after a bowel movement where she blacked out.  She was confused initially after regaining consciousness.  Had some emesis.  Unwitnessed episode.  States since the episode, she has had a constant "migraine" - headache.  Reports pain from back of head to front.  They report she has had a couple other episodes of syncope.  Unclear etiology.  One episode was when she was having some increased pain.  No chest pain.  No sob.  Persistent abdominal pain as outlined.  Seeing gyn. She has a history of anxiety.  Previously would pull out her eyelashes and eyebrows.  Discussed referral to psychiatry.  She is in agreement.     Past Medical History:  Diagnosis Date  . ADHD (attention deficit hyperactivity disorder)   . Anemia   . Anxiety   . Asthma   . Back pain   . Headache   . History of kidney stones    In the past  . Ovarian cyst   . PID (pelvic inflammatory disease)    Past Surgical History:  Procedure Laterality Date  . LAPAROSCOPY    . LAPAROSCOPY N/A 12/25/2016   Procedure: LAPAROSCOPY DIAGNOSTIC WITH BIOPSIES;  Surgeon: Herold HarmsMartin A Defrancesco, MD;  Location: [facility] ORS;  Service: Gynecology;  Laterality: N/A;  . TONSILLECTOMY     Family History  Problem Relation Age of Onset  . Diabetes Mother   . Diabetes Maternal Grandmother   . Heart disease Maternal Grandmother     GRT ALSO  . Breast cancer Paternal Grandmother     GRT  . Prostate cancer Maternal Uncle   . Ovarian cancer Neg Hx   . Colon cancer Neg Hx    Social History    Social History  . Marital status: Single    Spouse name: N/A  . Number of children: N/A  . Years of education: N/A   Social History Main Topics  . Smoking status: Never Smoker  . Smokeless tobacco: Former NeurosurgeonUser  . Alcohol use Yes     Comment: RARE  . Drug use: Yes    Types: Marijuana     Comment: DAILY- X 4 YEARS  . Sexual activity: Yes    Birth control/ protection: None   Other Topics Concern  . None   Social History Narrative  . None    Outpatient Encounter Prescriptions as of 12/21/2016  Medication Sig  . ibuprofen (ADVIL,MOTRIN) 200 MG tablet Take 200 mg by mouth every 6 (six) hours as needed.  . lidocaine (LIDODERM) 5 % Place 1 patch onto the skin daily as needed for pain.  Marland Kitchen. ondansetron (ZOFRAN-ODT) 8 MG disintegrating tablet Take 1 tablet (8 mg total) by mouth every 8 (eight) hours as needed for nausea.  . promethazine (PHENERGAN) 25 MG tablet Take 1 tablet (25 mg total) by mouth every 8 (eight) hours as needed for nausea or vomiting.  . [DISCONTINUED] oxyCODONE (OXY IR/ROXICODONE) 5 MG immediate release tablet Take 5 mg  by mouth every 4 (four) hours as needed for pain.   No facility-administered encounter medications on file as of 12/21/2016.     Review of Systems  Constitutional: Negative for appetite change and unexpected weight change.  HENT: Negative for congestion and sinus pressure.   Respiratory: Negative for cough, chest tightness and shortness of breath.   Cardiovascular: Negative for chest pain, palpitations and leg swelling.  Gastrointestinal: Positive for abdominal pain. Negative for diarrhea, nausea and vomiting.       No vomiting or nausea now.  She is eating.   Genitourinary: Negative for difficulty urinating and dysuria.  Musculoskeletal: Negative for joint swelling and myalgias.  Skin: Negative for color change and rash.  Neurological: Positive for headaches. Negative for weakness.  Psychiatric/Behavioral:       Increased anxiety.  Denies  suicidal ideations.        Objective:    Physical Exam  Constitutional: She appears well-developed and well-nourished. No distress.  HENT:  Nose: Nose normal.  Mouth/Throat: Oropharynx is clear and moist.  Neck: Neck supple. No thyromegaly present.  Cardiovascular: Normal rate and regular rhythm.   Pulmonary/Chest: Breath sounds normal. No respiratory distress. She has no wheezes.  Abdominal: Soft. Bowel sounds are normal. There is no tenderness.  Musculoskeletal: She exhibits no edema or tenderness.  Lymphadenopathy:    She has no cervical adenopathy.  Skin: No rash noted. No erythema.  Psychiatric: She has a normal mood and affect. Her behavior is normal.    BP 108/62 (BP Location: Left Arm, Patient Position: Sitting, Cuff Size: Large)   Pulse 100   Temp 98.6 F (37 C) (Oral)   Ht 5\' 4"  (1.626 m)   Wt 132 lb 12.8 oz (60.2 kg)   LMP 11/04/2016   SpO2 98%   BMI 22.80 kg/m  Wt Readings from Last 3 Encounters:  12/25/16 132 lb (59.9 kg)  12/21/16 131 lb (59.4 kg)  12/21/16 132 lb 12.8 oz (60.2 kg)     Lab Results  Component Value Date   WBC 5.8 12/21/2016   HGB 14.6 12/21/2016   HCT 43.2 12/21/2016   PLT 230 12/21/2016   GLUCOSE 89 12/03/2016   ALT 10 (L) 12/03/2016   AST 26 12/03/2016   NA 139 12/03/2016   K 3.7 12/03/2016   CL 105 12/03/2016   CREATININE 0.84 12/03/2016   BUN 6 12/03/2016   CO2 24 12/03/2016       Assessment & Plan:   Problem List Items Addressed This Visit    Generalized anxiety disorder    Discussed at length with her today.  This has been an ongoing issue.  Will refer to psychiatry.        Relevant Orders   Ambulatory referral to Psychiatry   Headache    Persistent headache since she passed out.  Episode unwitnessed.  Check CT head.  Discussed further w/up for her episodes.  No chest pain.  No increased heart rate or palpitations.  Could be vasovagal.  Mother and pt request neurology evaluation.  Refer to neurology.  Will hold on  driving at this time.        Relevant Orders   CT Head Wo Contrast (Completed)   Pelvic pain    Persistent pain.  Seeing gyn.  Planning for surgery soon.       Syncope - Primary    Unclear etiology.  No chest pain, sob or increased heart rate or palpitations.  Had EKG in ER.  Question if  vasovagal.  Mother and Adithi request referral to neurology for evaluation.  Stay hydrated.  Eat regular meals.  Not orthostatic on exam.       Relevant Orders   Ambulatory referral to Neurology    Other Visit Diagnoses    Menstrual irregularity       Relevant Orders   POCT urine pregnancy (Completed)     I spent 45 minutes with the patient and more than 50% of the time was spent in consultation regarding the above. Time spent obtaining history and discussing current concerns and problems.  Also discussed plan for further evaluation and w/up.      Dale Hoodsport, MD

## 2016-12-21 NOTE — H&P (Signed)
Subjective:  PREOPERATIVE HISTORY AND PHYSICAL   Date of surgery: 12/25/2016 Procedure: Laparoscopy with peritoneal biopsies Diagnoses: 1. Chronic abdominal pelvic pain 2. Dysmenorrhea 3. Dyspareunia   Patient is a 26 y.o. [id number] scheduled for laparoscopy procedure. Indications for procedure are chronic pelvic and RLQ abdominal pain, dysmenorrhea, & dyspareunia.  1. PreOp Exam: Pt. stopped OCP d/t reading online that pills cause ovarian cysts; pt. stopped meclofenamate sodium d/t nausea. Pt. Has stopped smoking marijuana.  Of note, 12/18/16, episode before bowel movement: body felt numb & and pt lost consciousness. Confusion upon regaining consciousness. Ensuing large bowel movement & corresponding vasovagal reflex. Cramping in lower pelvic area. Mother checked glucose 1.5h post meal - 134. Pt. Reports chronic history of migraines and hematuria. Finished course of doxycycline and metronidazole & ceftriaxone injection prescribed by ED 1/21/1. H/o L ovarian cyst. Has lost two pounds since last visit.    Pertinent Gynecological History: 10/25/16 ultrasound revealed 1.8 cm simple cyst otherwise nml. 11/17/15 laparoscopy for ruptured ovarian cyst w/o endometriosis dx. Menarche Age 57 with oligomenorrhea (les than 10 / year)  LMP: 11/04/16 Contraception: none. Stopped OCP d/t reading that "they may cause ovarian cysts." Last mammogram: n/a  Last pap: n/a  Discussed Blood/Blood Products: no   Menstrual History: OB History    Gravida Para Term Preterm AB Living   0 0 0 0 0 0   SAB TAB Ectopic Multiple Live Births   0 0 0 0 0      Menarche age: 33 Patient's last menstrual period was 11/04/2016 (exact date).    Past Medical History:  Diagnosis Date  . Anxiety   . Asthma   . Back pain   . Ovarian cyst   . PID (pelvic inflammatory disease)     Past Surgical History:  Procedure Laterality Date  . LAPAROSCOPY    . TONSILLECTOMY      OB History  Gravida Para Term Preterm  AB Living  0 0 0 0 0 0  SAB TAB Ectopic Multiple Live Births  0 0 0 0 0        Social History   Social History  . Marital status: Single    Spouse name: N/A  . Number of children: N/A  . Years of education: N/A   Social History Main Topics  . Smoking status: Never Smoker  . Smokeless tobacco: Former Neurosurgeon  . Alcohol use Yes     Comment: RARE  . Drug use: Yes    Types: Marijuana     Comment: DAILY- X 4 YEARS  . Sexual activity: Yes    Birth control/ protection: None   Other Topics Concern  . None   Social History Narrative  . None    Family History  Problem Relation Age of Onset  . Diabetes Mother   . Diabetes Maternal Grandmother   . Heart disease Maternal Grandmother     GRT ALSO  . Breast cancer Paternal Grandmother     GRT  . Ovarian cancer Neg Hx   . Colon cancer Neg Hx      (Not in a hospital admission)  Allergies  Allergen Reactions  . Tramadol Nausea And Vomiting and Other (See Comments)    cramping  . Hydrocodone Itching  . Morphine And Related Itching    Review of Systems Constitutional: No recent fever/chills/sweats Respiratory: No recent cough/bronchitis Cardiovascular: No chest pain Gastrointestinal: No recent nausea/vomiting/diarrhea Genitourinary: No UTI symptoms Hematologic/lymphatic:No history of coagulopathy or recent blood thinner use  Objective:    BP 117/66   Pulse 88   Ht 5\' 4"  (1.626 m)   Wt 131 lb 9.6 oz (59.7 kg)   LMP 11/04/2016 (Exact Date)   BMI 22.59 kg/m   General:   Normal  Skin:   normal  HEENT:  Normal  Neck:  Supple without Adenopathy or Thyromegaly  Lungs:   Heart:              Breasts:   Abdomen:  Pelvis:  M/S   Extremeties:  Neuro:    clear to auscultation bilaterally   Normal without murmur   Not Examined   soft, non-tender; bowel sounds normal; no masses,  no organomegaly   Exam deferred to OR  No CVAT  Warm/Dry   Normal          10/31/2016 PELVIC:             External  Genitalia: Normal             BUS: Normal             Vagina: Normal             Cervix: no lesions; mild cervical motion tenderness             Uterus: Normal size, shape,consistency, mobile, 2/4 tender             Adnexa:nonpalpable, mild tenderness right greater than left             RV: Normal external exam             Bladder: Nontender Assessment:    1. Chronic pelvic pain 2. Dysmenorrhea 3. Dyspareunia     Plan:  Laparoscopy with peritoneal biopsies  1. Counseling: Laparoscopy procedure, risks, reasons, benefits and complications (including injury to bowel, bladder, major blood vessel, ureter, bleeding, possibility of transfusion, infection, or fistula formation) reviewed in detail.  2. Consent signed. 3. Preop testing ordered. 4. Instructions reviewed, including NPO after midnight. 5. F/u one week after surgery for post op check. 6. Order phenergan per pt. Request.   Marisue IvanJacquelyn Visser, PA-S Herold HarmsMartin A Marck Mcclenny, MD   I have seen, interviewed, and examined the patient in conjunction with the [employer] and affirm the diagnosis and management plan. Cleon Thoma A. Glennda Weatherholtz, MD, FACOG   Note: This dictation was prepared with Dragon dictation along with smaller phrase technology. Any transcriptional errors that result from this process are unintentional.

## 2016-12-21 NOTE — Progress Notes (Signed)
Pre-visit discussion using our clinic review tool. No additional management support is needed unless otherwise documented below in the visit note.  

## 2016-12-21 NOTE — Pre-Procedure Instructions (Signed)
On arrival to Pre-Admit Testing patient stated on Monday night, (February 5 ) she had a syncope episode with lightheaded, and loss of vision. Unsure whether she hit her head, however, patient stated she has been experiencing headaches since the fall. Patient stated she has a appointment with Dr. Dale Durhamharlene Scott today at 11:00 am and was instructed to call Pre-Admit Testing after the visit to inform staff what Dr. Lorin PicketScott said in the office. Dr. Mikey BussingVan Stavern made aware of all the above.

## 2016-12-22 LAB — RPR: RPR Ser Ql: NONREACTIVE

## 2016-12-22 LAB — URINE CULTURE

## 2016-12-25 ENCOUNTER — Ambulatory Visit: Payer: BLUE CROSS/BLUE SHIELD | Admitting: Certified Registered Nurse Anesthetist

## 2016-12-25 ENCOUNTER — Encounter
Admission: RE | Disposition: A | Discharge status: Home / Self Care | Payer: Self-pay | Source: Ambulatory Visit | Attending: Obstetrics and Gynecology

## 2016-12-25 ENCOUNTER — Ambulatory Visit
Admission: RE | Admit: 2016-12-25 | Discharge: 2016-12-25 | Disposition: A | Discharge status: Home / Self Care | Payer: BLUE CROSS/BLUE SHIELD | Source: Ambulatory Visit | Attending: Obstetrics and Gynecology | Admitting: Obstetrics and Gynecology

## 2016-12-25 DIAGNOSIS — Z842 Family history of other diseases of the genitourinary system: Secondary | ICD-10-CM | POA: Insufficient documentation

## 2016-12-25 DIAGNOSIS — N809 Endometriosis, unspecified: Secondary | ICD-10-CM

## 2016-12-25 DIAGNOSIS — N941 Unspecified dyspareunia: Secondary | ICD-10-CM

## 2016-12-25 DIAGNOSIS — G8929 Other chronic pain: Secondary | ICD-10-CM | POA: Diagnosis not present

## 2016-12-25 DIAGNOSIS — N803 Endometriosis of pelvic peritoneum: Secondary | ICD-10-CM | POA: Insufficient documentation

## 2016-12-25 DIAGNOSIS — Z79899 Other long term (current) drug therapy: Secondary | ICD-10-CM | POA: Diagnosis not present

## 2016-12-25 DIAGNOSIS — N946 Dysmenorrhea, unspecified: Secondary | ICD-10-CM | POA: Diagnosis present

## 2016-12-25 DIAGNOSIS — R1031 Right lower quadrant pain: Secondary | ICD-10-CM | POA: Diagnosis not present

## 2016-12-25 DIAGNOSIS — R102 Pelvic and perineal pain: Secondary | ICD-10-CM | POA: Diagnosis not present

## 2016-12-25 HISTORY — PX: LAPAROSCOPY: SHX197

## 2016-12-25 LAB — URINE DRUG SCREEN, QUALITATIVE (ARMC ONLY)
AMPHETAMINES, UR SCREEN: NOT DETECTED
BARBITURATES, UR SCREEN: NOT DETECTED
BENZODIAZEPINE, UR SCRN: NOT DETECTED
COCAINE METABOLITE, UR ~~LOC~~: NOT DETECTED
Cannabinoid 50 Ng, Ur ~~LOC~~: NOT DETECTED
MDMA (Ecstasy)Ur Screen: NOT DETECTED
METHADONE SCREEN, URINE: NOT DETECTED
OPIATE, UR SCREEN: NOT DETECTED
Phencyclidine (PCP) Ur S: NOT DETECTED
TRICYCLIC, UR SCREEN: NOT DETECTED

## 2016-12-25 LAB — ABO/RH: ABO/RH(D): A POS

## 2016-12-25 LAB — POCT PREGNANCY, URINE: Preg Test, Ur: NEGATIVE

## 2016-12-25 LAB — PREGNANCY, URINE: Preg Test, Ur: NEGATIVE

## 2016-12-25 SURGERY — LAPAROSCOPY, DIAGNOSTIC
Anesthesia: General | Site: Abdomen | Wound class: Clean Contaminated

## 2016-12-25 MED ORDER — MIDAZOLAM HCL 2 MG/2ML IJ SOLN
INTRAMUSCULAR | Status: DC | PRN
  Administered 2016-12-25 (×2): 1 mg via INTRAVENOUS

## 2016-12-25 MED ORDER — PROPOFOL 10 MG/ML IV BOLUS
INTRAVENOUS | Status: AC
  Filled 2016-12-25: qty 20

## 2016-12-25 MED ORDER — ACETAMINOPHEN NICU IV SYRINGE 10 MG/ML
INTRAVENOUS | Status: AC
  Filled 2016-12-25: qty 1

## 2016-12-25 MED ORDER — FENTANYL CITRATE (PF) 100 MCG/2ML IJ SOLN
25.0000 ug | INTRAMUSCULAR | Status: AC | PRN
Start: 2016-12-25 — End: 2016-12-25
  Administered 2016-12-25 (×6): 25 ug via INTRAVENOUS

## 2016-12-25 MED ORDER — FAMOTIDINE 20 MG PO TABS
ORAL_TABLET | ORAL | Status: AC
  Filled 2016-12-25: qty 1

## 2016-12-25 MED ORDER — LACTATED RINGERS IV SOLN
INTRAVENOUS | Status: DC
  Administered 2016-12-25: 08:00:00 via INTRAVENOUS

## 2016-12-25 MED ORDER — OXYCODONE HCL 5 MG PO TABS
ORAL_TABLET | ORAL | Status: AC
  Filled 2016-12-25: qty 1

## 2016-12-25 MED ORDER — FENTANYL CITRATE (PF) 100 MCG/2ML IJ SOLN
INTRAMUSCULAR | Status: DC
Start: 2016-12-25 — End: 2016-12-25
  Filled 2016-12-25: qty 2

## 2016-12-25 MED ORDER — IBUPROFEN 800 MG PO TABS: 800.0000 mg | ORAL_TABLET | Freq: Three times a day (TID) | ORAL | 1 refills | Status: DC

## 2016-12-25 MED ORDER — PROPOFOL 10 MG/ML IV BOLUS
INTRAVENOUS | Status: DC | PRN
  Administered 2016-12-25: 170 mg via INTRAVENOUS

## 2016-12-25 MED ORDER — ROCURONIUM BROMIDE 100 MG/10ML IV SOLN
INTRAVENOUS | Status: DC | PRN
  Administered 2016-12-25: 15 mg via INTRAVENOUS
  Administered 2016-12-25: 5 mg via INTRAVENOUS

## 2016-12-25 MED ORDER — DEXAMETHASONE SODIUM PHOSPHATE 10 MG/ML IJ SOLN
INTRAMUSCULAR | Status: DC | PRN
  Administered 2016-12-25: 5 mg via INTRAVENOUS

## 2016-12-25 MED ORDER — SUCCINYLCHOLINE CHLORIDE 20 MG/ML IJ SOLN
INTRAMUSCULAR | Status: DC | PRN
  Administered 2016-12-25: 80 mg via INTRAVENOUS

## 2016-12-25 MED ORDER — MIDAZOLAM HCL 2 MG/2ML IJ SOLN
INTRAMUSCULAR | Status: AC
  Filled 2016-12-25: qty 2

## 2016-12-25 MED ORDER — OXYCODONE HCL 5 MG PO TABS: 5.0000 mg | ORAL_TABLET | ORAL | 0 refills | Status: DC | PRN

## 2016-12-25 MED ORDER — FENTANYL CITRATE (PF) 100 MCG/2ML IJ SOLN
INTRAMUSCULAR | Status: DC | PRN
  Administered 2016-12-25: 100 ug via INTRAVENOUS

## 2016-12-25 MED ORDER — ONDANSETRON HCL 4 MG/2ML IJ SOLN
INTRAMUSCULAR | Status: DC | PRN
  Administered 2016-12-25: 4 mg via INTRAVENOUS

## 2016-12-25 MED ORDER — SUGAMMADEX SODIUM 500 MG/5ML IV SOLN
INTRAVENOUS | Status: DC | PRN
  Administered 2016-12-25: 119.8 mg via INTRAVENOUS

## 2016-12-25 MED ORDER — ONDANSETRON HCL 4 MG/2ML IJ SOLN: 4.0000 mg | Freq: Once | INTRAMUSCULAR | Status: DC | PRN

## 2016-12-25 MED ORDER — LIDOCAINE HCL (CARDIAC) 20 MG/ML IV SOLN
INTRAVENOUS | Status: DC | PRN
  Administered 2016-12-25: 50 mg via INTRAVENOUS

## 2016-12-25 MED ORDER — FAMOTIDINE 20 MG PO TABS
20.0000 mg | ORAL_TABLET | Freq: Once | ORAL | Status: AC
  Administered 2016-12-25: 20 mg via ORAL

## 2016-12-25 MED ORDER — LIDOCAINE HCL (PF) 2 % IJ SOLN
INTRAMUSCULAR | Status: AC
  Filled 2016-12-25: qty 2

## 2016-12-25 MED ORDER — OXYCODONE HCL 5 MG PO TABS
5.0000 mg | ORAL_TABLET | ORAL | Status: DC | PRN
  Administered 2016-12-25: 5 mg via ORAL

## 2016-12-25 MED ORDER — GLYCOPYRROLATE 0.2 MG/ML IJ SOLN
INTRAMUSCULAR | Status: DC | PRN
  Administered 2016-12-25: .1 mg via INTRAVENOUS

## 2016-12-25 MED ORDER — ACETAMINOPHEN 10 MG/ML IV SOLN
INTRAVENOUS | Status: DC | PRN
  Administered 2016-12-25: 1000 mg via INTRAVENOUS

## 2016-12-25 MED ORDER — FENTANYL CITRATE (PF) 100 MCG/2ML IJ SOLN
INTRAMUSCULAR | Status: AC
  Filled 2016-12-25: qty 2

## 2016-12-25 SURGICAL SUPPLY — 38 items
BLADE SURG SZ11 CARB STEEL (BLADE) ×2 IMPLANT
CANISTER SUCT 1200ML W/VALVE (MISCELLANEOUS) ×2 IMPLANT
CATH ROBINSON RED A/P 16FR (CATHETERS) ×2 IMPLANT
CHLORAPREP W/TINT 26ML (MISCELLANEOUS) ×2 IMPLANT
DRESSING TELFA 4X3 1S ST N-ADH (GAUZE/BANDAGES/DRESSINGS) IMPLANT
DRSG TEGADERM 2-3/8X2-3/4 SM (GAUZE/BANDAGES/DRESSINGS) ×6 IMPLANT
DRSG TEGADERM 4X4.75 (GAUZE/BANDAGES/DRESSINGS) IMPLANT
DRSG TELFA 3X8 NADH (GAUZE/BANDAGES/DRESSINGS) ×2 IMPLANT
GLOVE BIO SURGEON STRL SZ 6.5 (GLOVE) ×2 IMPLANT
GLOVE BIO SURGEON STRL SZ7 (GLOVE) ×4 IMPLANT
GLOVE BIO SURGEON STRL SZ8 (GLOVE) ×2 IMPLANT
GLOVE INDICATOR 8.0 STRL GRN (GLOVE) ×2 IMPLANT
GOWN STRL REUS W/ TWL LRG LVL3 (GOWN DISPOSABLE) ×2 IMPLANT
GOWN STRL REUS W/ TWL XL LVL3 (GOWN DISPOSABLE) ×2 IMPLANT
GOWN STRL REUS W/TWL LRG LVL3 (GOWN DISPOSABLE) ×2
GOWN STRL REUS W/TWL XL LVL3 (GOWN DISPOSABLE) ×2
IRRIGATION STRYKERFLOW (MISCELLANEOUS) IMPLANT
IRRIGATOR STRYKERFLOW (MISCELLANEOUS)
IV LACTATED RINGERS 1000ML (IV SOLUTION) ×2 IMPLANT
KIT PINK PAD W/HEAD ARE REST (MISCELLANEOUS) ×2
KIT PINK PAD W/HEAD ARM REST (MISCELLANEOUS) ×1 IMPLANT
KIT RM TURNOVER CYSTO AR (KITS) ×2 IMPLANT
LABEL OR SOLS (LABEL) IMPLANT
LIQUID BAND (GAUZE/BANDAGES/DRESSINGS) ×2 IMPLANT
NS IRRIG 500ML POUR BTL (IV SOLUTION) ×2 IMPLANT
PACK GYN LAPAROSCOPIC (MISCELLANEOUS) ×2 IMPLANT
PAD OB MATERNITY 4.3X12.25 (PERSONAL CARE ITEMS) ×2 IMPLANT
PAD PREP 24X41 OB/GYN DISP (PERSONAL CARE ITEMS) ×2 IMPLANT
POUCH ENDO CATCH 10MM SPEC (MISCELLANEOUS) IMPLANT
SCISSORS METZENBAUM CVD 33 (INSTRUMENTS) ×2 IMPLANT
SLEEVE ENDOPATH XCEL 5M (ENDOMECHANICALS) ×2 IMPLANT
SUT MNCRL 4-0 (SUTURE) ×1
SUT MNCRL 4-0 27XMFL (SUTURE) ×1
SUT VIC AB 0 UR5 27 (SUTURE) ×2 IMPLANT
SUTURE MNCRL 4-0 27XMF (SUTURE) ×1 IMPLANT
SYR 30ML LL (SYRINGE) ×2 IMPLANT
TROCAR XCEL NON-BLD 5MMX100MML (ENDOMECHANICALS) ×2 IMPLANT
TUBING INSUFFLATOR HI FLOW (MISCELLANEOUS) ×2 IMPLANT

## 2016-12-25 NOTE — Anesthesia Postprocedure Evaluation (Signed)
Anesthesia Post Note  Patient: Abigail Harding  Procedure(s) Performed: Procedure(s) (LRB): LAPAROSCOPY DIAGNOSTIC WITH BIOPSIES (N/A)  Patient location during evaluation: PACU Anesthesia Type: General Level of consciousness: awake and alert Pain management: pain level controlled Vital Signs Assessment: post-procedure vital signs reviewed and stable Respiratory status: spontaneous breathing and respiratory function stable Cardiovascular status: stable Anesthetic complications: no     Last Vitals:  Vitals:   12/25/16 0805 12/25/16 1100  BP: 131/70 136/77  Pulse: 89 (!) 113  Resp: 16 20  Temp: 36.4 C 36.8 C    Last Pain:  Vitals:   12/25/16 1100  TempSrc:   PainSc: Asleep                 KEPHART,WILLIAM K

## 2016-12-25 NOTE — Anesthesia Procedure Notes (Signed)
Procedure Name: Intubation Date/Time: 12/25/2016 9:52 AM Performed by: Derinda LateIACONE, Zoie Sarin Pre-anesthesia Checklist: Patient identified, Patient being monitored, Timeout performed, Emergency Drugs available and Suction available Patient Re-evaluated:Patient Re-evaluated prior to inductionOxygen Delivery Method: Circle system utilized Preoxygenation: Pre-oxygenation with 100% oxygen Intubation Type: IV induction Ventilation: Mask ventilation without difficulty Laryngoscope Size: Miller and 2 Grade View: Grade I Tube type: Oral Tube size: 7.0 mm Number of attempts: 1 Airway Equipment and Method: Stylet Placement Confirmation: ETT inserted through vocal cords under direct vision,  positive ETCO2 and breath sounds checked- equal and bilateral Secured at: 20 cm Tube secured with: Tape Dental Injury: Teeth and Oropharynx as per pre-operative assessment

## 2016-12-25 NOTE — Progress Notes (Signed)
Minimal drainage on pad 

## 2016-12-25 NOTE — H&P (View-Only) (Signed)
Subjective:  PREOPERATIVE HISTORY AND PHYSICAL   Date of surgery: 12/25/2016 Procedure: Laparoscopy with peritoneal biopsies Diagnoses: 1. Chronic abdominal pelvic pain 2. Dysmenorrhea 3. Dyspareunia   Patient is a 26 y.o. [id number] scheduled for laparoscopy procedure. Indications for procedure are chronic pelvic and RLQ abdominal pain, dysmenorrhea, & dyspareunia.  1. PreOp Exam: Pt. stopped OCP d/t reading online that pills cause ovarian cysts; pt. stopped meclofenamate sodium d/t nausea. Pt. Has stopped smoking marijuana.  Of note, 12/18/16, episode before bowel movement: body felt numb & and pt lost consciousness. Confusion upon regaining consciousness. Ensuing large bowel movement & corresponding vasovagal reflex. Cramping in lower pelvic area. Mother checked glucose 1.5h post meal - 134. Pt. Reports chronic history of migraines and hematuria. Finished course of doxycycline and metronidazole & ceftriaxone injection prescribed by ED 1/21/1. H/o L ovarian cyst. Has lost two pounds since last visit.    Pertinent Gynecological History: 10/25/16 ultrasound revealed 1.8 cm simple cyst otherwise nml. 11/17/15 laparoscopy for ruptured ovarian cyst w/o endometriosis dx. Menarche Age 57 with oligomenorrhea (les than 10 / year)  LMP: 11/04/16 Contraception: none. Stopped OCP d/t reading that "they may cause ovarian cysts." Last mammogram: n/a  Last pap: n/a  Discussed Blood/Blood Products: no   Menstrual History: OB History    Gravida Para Term Preterm AB Living   0 0 0 0 0 0   SAB TAB Ectopic Multiple Live Births   0 0 0 0 0      Menarche age: 33 Patient's last menstrual period was 11/04/2016 (exact date).    Past Medical History:  Diagnosis Date  . Anxiety   . Asthma   . Back pain   . Ovarian cyst   . PID (pelvic inflammatory disease)     Past Surgical History:  Procedure Laterality Date  . LAPAROSCOPY    . TONSILLECTOMY      OB History  Gravida Para Term Preterm  AB Living  0 0 0 0 0 0  SAB TAB Ectopic Multiple Live Births  0 0 0 0 0        Social History   Social History  . Marital status: Single    Spouse name: N/A  . Number of children: N/A  . Years of education: N/A   Social History Main Topics  . Smoking status: Never Smoker  . Smokeless tobacco: Former Neurosurgeon  . Alcohol use Yes     Comment: RARE  . Drug use: Yes    Types: Marijuana     Comment: DAILY- X 4 YEARS  . Sexual activity: Yes    Birth control/ protection: None   Other Topics Concern  . None   Social History Narrative  . None    Family History  Problem Relation Age of Onset  . Diabetes Mother   . Diabetes Maternal Grandmother   . Heart disease Maternal Grandmother     GRT ALSO  . Breast cancer Paternal Grandmother     GRT  . Ovarian cancer Neg Hx   . Colon cancer Neg Hx      (Not in a hospital admission)  Allergies  Allergen Reactions  . Tramadol Nausea And Vomiting and Other (See Comments)    cramping  . Hydrocodone Itching  . Morphine And Related Itching    Review of Systems Constitutional: No recent fever/chills/sweats Respiratory: No recent cough/bronchitis Cardiovascular: No chest pain Gastrointestinal: No recent nausea/vomiting/diarrhea Genitourinary: No UTI symptoms Hematologic/lymphatic:No history of coagulopathy or recent blood thinner use  Objective:    BP 117/66   Pulse 88   Ht 5\' 4"  (1.626 m)   Wt 131 lb 9.6 oz (59.7 kg)   LMP 11/04/2016 (Exact Date)   BMI 22.59 kg/m   General:   Normal  Skin:   normal  HEENT:  Normal  Neck:  Supple without Adenopathy or Thyromegaly  Lungs:   Heart:              Breasts:   Abdomen:  Pelvis:  M/S   Extremeties:  Neuro:    clear to auscultation bilaterally   Normal without murmur   Not Examined   soft, non-tender; bowel sounds normal; no masses,  no organomegaly   Exam deferred to OR  No CVAT  Warm/Dry   Normal          10/31/2016 PELVIC:             External  Genitalia: Normal             BUS: Normal             Vagina: Normal             Cervix: no lesions; mild cervical motion tenderness             Uterus: Normal size, shape,consistency, mobile, 2/4 tender             Adnexa:nonpalpable, mild tenderness right greater than left             RV: Normal external exam             Bladder: Nontender Assessment:    1. Chronic pelvic pain 2. Dysmenorrhea 3. Dyspareunia     Plan:  Laparoscopy with peritoneal biopsies  1. Counseling: Laparoscopy procedure, risks, reasons, benefits and complications (including injury to bowel, bladder, major blood vessel, ureter, bleeding, possibility of transfusion, infection, or fistula formation) reviewed in detail.  2. Consent signed. 3. Preop testing ordered. 4. Instructions reviewed, including NPO after midnight. 5. F/u one week after surgery for post op check. 6. Order phenergan per pt. Request.   Marisue IvanJacquelyn Visser, PA-S Herold HarmsMartin A Melonee Gerstel, MD   I have seen, interviewed, and examined the patient in conjunction with the [employer] and affirm the diagnosis and management plan. Autumm Hattery A. Dianely Krehbiel, MD, FACOG   Note: This dictation was prepared with Dragon dictation along with smaller phrase technology. Any transcriptional errors that result from this process are unintentional.

## 2016-12-25 NOTE — Anesthesia Post-op Follow-up Note (Cosign Needed)
Anesthesia QCDR form completed.        

## 2016-12-25 NOTE — Interval H&P Note (Signed)
History and Physical Interval Note:  12/25/2016 9:18 AM  Abigail Harding  has presented today for surgery, with the diagnosis of pelvic pain, dysmenorrhea, dyspareunia, f/h of endometriosis, RLQ pain  The various methods of treatment have been discussed with the patient and family. After consideration of risks, benefits and other options for treatment, the patient has consented to  Procedure(s): LAPAROSCOPY DIAGNOSTIC WITH BIOPSIES (N/A) as a surgical intervention .  The patient's history has been reviewed, patient examined, no change in status, stable for surgery.  I have reviewed the patient's chart and labs.  Questions were answered to the patient's satisfaction.     Daphine DeutscherMartin A Daelyn Mozer

## 2016-12-25 NOTE — Transfer of Care (Signed)
Immediate Anesthesia Transfer of Care Note  Patient: Abigail Harding  Procedure(s) Performed: Procedure(s): LAPAROSCOPY DIAGNOSTIC WITH BIOPSIES (N/A)  Patient Location: PACU  Anesthesia Type:General  Level of Consciousness: patient cooperative and responds to stimulation  Airway & Oxygen Therapy: Patient Spontanous Breathing and Patient connected to face mask oxygen  Post-op Assessment: Report given to RN and Post -op Vital signs reviewed and stable  Post vital signs: Reviewed and stable  Last Vitals:  Vitals:   12/25/16 0805 12/25/16 1100  BP: 131/70 136/77  Pulse: 89 (!) 113  Resp: 16 20  Temp: 36.4 C 36.8 C    Last Pain:  Vitals:   12/25/16 0805  TempSrc: Tympanic         Complications: No apparent anesthesia complications

## 2016-12-25 NOTE — Discharge Instructions (Signed)

## 2016-12-25 NOTE — Op Note (Signed)
Rickard Rhymesaige M Steenson PROCEDURE DATE: 12/25/2016 10:50 AM  PREOPERATIVE DIAGNOSIS: pelvic pain, dysmenorrhea, dyspareunia, f/h of endometriosis, RLQ pain POSTOPERATIVE DIAGNOSIS: Same as above and Endometriosis  PROCEDURE: Procedure(s): LAPAROSCOPY DIAGNOSTIC WITH excision and fulguration of endometriosis SURGEON:  Dr. Daphine DeutscherMartin A Defrancesco ASSISTANT: NP-S ROY_ and Dr. Valentino Saxonherry ANESTHESIA: General Endotracheal  INDICATIONS: 26 y.o. G0P0000 with history of chronic pelvic pain concerning for endometriosis desiring surgical evaluation.   Please see preoperative notes for further details.   FINDINGS:  Small uterus, normal ovaries.LEFT FALLOPIAN TUBE WITH 3 MM VESICLES SUSPICIOUS FOR ENDOMETRIOSIS; RIGHT FALLOPIAN TUBE TORTUOUS BUT OTHERWISE NORMAL; CUL-DE-SAC WITH POWDER BURN IMPLANT CENTRALLY AND RED IMPLANTS ON THE RIGHT SIDE OF CUL-DE-SAC; LEFT UTEROSACRAL LIGAMENT WITH CLEAR VESICLES SUSPICIOUS FOR ENDOMETRIOSIS; BLADDER APPEARED NORMAL; APPENDIX NORMAL; UPPER ABDOMEN INCLUDING GALLBLADDER AND LIVER NORMAL   I/O's: Total I/O In: -  Out: 170 [Urine:150; Blood:20] SPECIMENS: Peritoneal biopsies (cul-de-sac, central; cul sac, right; left uterosacral ligament; left fallopian tube) COMPLICATIONS: None immediate COUNTS:  YES  PROCEDURE IN DETAIL: The patient was brought to the operating room where she was placed in the supine position.  General endotracheal anesthesia was induced without difficulty.  She was placed in the dorsal lithotomy position using bumblebee stirrups.  A ChloraPrep and Hibiclens, abdominal, perineal, intravaginal prep and drape was performed in standard fashion.  The timeout was completed.  The red Robison catheter was used to drain the bladder of clear urine.  A weighted speculum was placed into the vagina and a Hulka tenaculum was placed onto the cervix to facilitate uterine manipulation. Laparoscopy was performed in standard fashion.  The Optiview 5 mm trocar and sleeve were placed  through a 5 mm subumbilical incision directly into the abdominal pelvic cavity, without evidence of bowel or vascular injury. One additional 5 mm port was placed in the lower quadrant centrally, under direct visualization.  The above noted findings were photo documented. Excisional biopsies were performed with biopsy forceps in the region of the cul-de-sac, left uterosacral ligament, left fallopian tube. These excisional biopsy sites were cauterized with Kleppinger bipolar forceps. Good hemostasis was obtained. The pelvis was irrigated with normal saline and the irrigant fluid was aspirated. Upon completion of the procedures and inspection for hemostasis, the surgery was complete with all instrumentation being removed from the abdominal pelvic cavity.  The pneumoperitoneum was released.  The incisions were closed with a figure-of-eight stitch on the skin using 4-0 Monocryl. Dermabond glue was placed over the incisions.  The patient was awakened, extubated, and taken to the recovery room in satisfactory condition.  Herold HarmsMartin A Defrancesco, MD [facility]

## 2016-12-25 NOTE — Anesthesia Preprocedure Evaluation (Signed)
Anesthesia Evaluation  Patient identified by MRN, date of birth, ID band Patient awake    Reviewed: Allergy & Precautions, NPO status , Patient's Chart, lab work & pertinent test results  History of Anesthesia Complications Negative for: history of anesthetic complications  Airway Mallampati: II       Dental   Pulmonary asthma (pt denies) ,           Cardiovascular negative cardio ROS       Neuro/Psych Anxiety    GI/Hepatic negative GI ROS, Neg liver ROS,   Endo/Other  negative endocrine ROS  Renal/GU negative Renal ROS     Musculoskeletal   Abdominal   Peds  Hematology  (+) anemia ,   Anesthesia Other Findings   Reproductive/Obstetrics                             Anesthesia Physical Anesthesia Plan  ASA: II  Anesthesia Plan: General   Post-op Pain Management:    Induction: Intravenous  Airway Management Planned: Oral ETT  Additional Equipment:   Intra-op Plan:   Post-operative Plan:   Informed Consent: I have reviewed the patients History and Physical, chart, labs and discussed the procedure including the risks, benefits and alternatives for the proposed anesthesia with the patient or authorized representative who has indicated his/her understanding and acceptance.     Plan Discussed with:   Anesthesia Plan Comments:         Anesthesia Quick Evaluation

## 2016-12-26 LAB — SURGICAL PATHOLOGY

## 2016-12-26 NOTE — OR Nursing (Signed)
On postop phone call pt c/o of every muscle hurting and is taking pain med and ibuprofen - pt instructed to contact MD if no relief or if pain increases.

## 2016-12-31 ENCOUNTER — Encounter: Payer: Self-pay | Admitting: Internal Medicine

## 2016-12-31 DIAGNOSIS — R55 Syncope and collapse: Secondary | ICD-10-CM | POA: Insufficient documentation

## 2016-12-31 NOTE — Assessment & Plan Note (Addendum)
Unclear etiology.  No chest pain, sob or increased heart rate or palpitations.  Had EKG in ER.  Question if vasovagal.  Mother and [name] request referral to neurology for evaluation.  Stay hydrated.  Eat regular meals.  Not orthostatic on exam.

## 2016-12-31 NOTE — Assessment & Plan Note (Signed)
Persistent pain.  Seeing gyn.  Planning for surgery soon.

## 2016-12-31 NOTE — Assessment & Plan Note (Signed)
Persistent headache since she passed out.  Episode unwitnessed.  Check CT head.  Discussed further w/up for her episodes.  No chest pain.  No increased heart rate or palpitations.  Could be vasovagal.  Mother and pt request neurology evaluation.  Refer to neurology.  Will hold on driving at this time.

## 2016-12-31 NOTE — Assessment & Plan Note (Signed)
Discussed at length with her today.  This has been an ongoing issue.  Will refer to psychiatry.

## 2017-01-02 ENCOUNTER — Encounter: Payer: Self-pay | Admitting: Obstetrics and Gynecology

## 2017-01-02 ENCOUNTER — Ambulatory Visit (INDEPENDENT_AMBULATORY_CARE_PROVIDER_SITE_OTHER): Payer: BLUE CROSS/BLUE SHIELD | Admitting: Obstetrics and Gynecology

## 2017-01-02 VITALS — BP 95/68 | HR 100 | Ht 64.0 in | Wt 136.5 lb

## 2017-01-02 DIAGNOSIS — Z09 Encounter for follow-up examination after completed treatment for conditions other than malignant neoplasm: Secondary | ICD-10-CM

## 2017-01-02 DIAGNOSIS — R300 Dysuria: Secondary | ICD-10-CM | POA: Diagnosis not present

## 2017-01-02 DIAGNOSIS — N809 Endometriosis, unspecified: Secondary | ICD-10-CM

## 2017-01-02 LAB — POCT URINALYSIS DIPSTICK
BILIRUBIN UA: NEGATIVE
GLUCOSE UA: NEGATIVE
Ketones, UA: NEGATIVE
Leukocytes, UA: NEGATIVE
NITRITE UA: NEGATIVE
Protein, UA: NEGATIVE
Urobilinogen, UA: NEGATIVE
pH, UA: 6

## 2017-01-02 MED ORDER — PROMETHAZINE HCL 25 MG PO TABS: 25.0000 mg | ORAL_TABLET | Freq: Three times a day (TID) | ORAL | 0 refills | Status: DC | PRN

## 2017-01-02 NOTE — Progress Notes (Signed)
Chief complaint: 1. One week postop check 2. Status post laparoscopy with excision and fulguration of endometriosis  Patient presents for 1 week postop check. She is having normal bowel and bladder function. Mild postoperative discomfort persists. Findings from surgery were reviewed and included evidence of endometriosis.  Pathology from surgery: DIAGNOSIS:  A. CUL-DE-SAC, CENTRAL; BIOPSY:  - ENDOMETRIOSIS.   B. CUL-DE-SAC, RIGHT; BIOPSY:  - UNREMARKABLE MESOTHELIUM.   C. UTERO SACRAL LIGAMENT, LEFT; BIOPSY:  - ENDOMETRIOSIS.   D. FALLOPIAN TUBE, LEFT; BIOPSY:  - UNREMARKABLE MESOTHELIUM.    OBJECTIVE: BP 95/68   Pulse 100   Ht $RemoveBeforeDEI _cnIbDvckrTdPwObcmTmLkMukLzEevZVY$5\' 4"Unknown)   BMI 23.43 kg/m  Pleasant female in no acute distress Abdomen: Soft, nontender; laparoscopy incision sites are well approximated and healing. Dermabond glue persists. No evidence of infection or hernia.  ASSESSMENT: 1. Normal 1 week postop check status post laparoscopy with excision and fulguration of endometriosis 2. Endometriosis 3. Dysmenorrhea  PLAN: 1. Management options for medical management of endometriosis were reviewed in detail. Questions were answered.   Medications include Mirena IUD, Depo-Lupron injection, Synarel nasal spray, danazol oral medication, birth control pills, and Depo-Provera injection.  2. Patient is to contact us for further management planning regarding endometriosis when she discusses the options and assessed cost through her insurance carrier.  3. Urinalysis and culture  Herold HarmsMartin A Kain Milosevic, MD  Note: This dictation was prepared with Dragon dictation along with smaller phrase technology. Any transcriptional errors that result from this process are unintentional.

## 2017-01-02 NOTE — Patient Instructions (Signed)
1. Resume activities as tolerated 2. Management options for endometriosis include the following  Mirena IUD  Depo-Lupron (injection)  Synarel nasal spray  Danazol-oral medication  Birth control pills  Depo-Provera (birth-control shot)   Current recommendation regarding personal preference (patient's) is danazol.  3.Contact our office to determine follow-up once choice of medication for endometriosis decided upon

## 2017-01-04 ENCOUNTER — Telehealth: Payer: Self-pay

## 2017-01-04 LAB — URINE CULTURE

## 2017-01-04 NOTE — Telephone Encounter (Signed)
Pt called and wanted to know about other options than what Dr. Algis Downs. Had suggested on visit for endometriosis.  She has researched these and does not want to take a pill, shot, IUD, nose spray, etc. Also her insurance does not cover the medications. She states she wants a hysterectomy.  She had to go home today due to pain and nausea. Also pt only has a few Percocet's' left and is taking the IBP as suggested. I did offer her an appt with Dr. Logan BoresEvans today if she would like and pt declined. She states she doesn't like him. The other MD is out of office today. Pt aware Dr. Algis Downs is out of office and message sent to nurse for a call back tomorrow. Pt will go to ER if needed in the meantime.

## 2017-01-05 NOTE — Telephone Encounter (Signed)
Pt aware per mad she will need to discuss hyst with him. Appt made for 2/27 at 8:30.

## 2017-01-09 ENCOUNTER — Ambulatory Visit (INDEPENDENT_AMBULATORY_CARE_PROVIDER_SITE_OTHER): Payer: BLUE CROSS/BLUE SHIELD | Admitting: Obstetrics and Gynecology

## 2017-01-09 ENCOUNTER — Encounter: Payer: Self-pay | Admitting: Obstetrics and Gynecology

## 2017-01-09 VITALS — BP 119/75 | HR 85 | Ht 64.0 in | Wt 136.5 lb

## 2017-01-09 DIAGNOSIS — Z3201 Encounter for pregnancy test, result positive: Secondary | ICD-10-CM

## 2017-01-09 DIAGNOSIS — R102 Pelvic and perineal pain: Secondary | ICD-10-CM

## 2017-01-09 DIAGNOSIS — N809 Endometriosis, unspecified: Secondary | ICD-10-CM

## 2017-01-09 DIAGNOSIS — N912 Amenorrhea, unspecified: Secondary | ICD-10-CM | POA: Diagnosis not present

## 2017-01-09 DIAGNOSIS — N979 Female infertility, unspecified: Secondary | ICD-10-CM

## 2017-01-09 LAB — POCT URINE PREGNANCY: Preg Test, Ur: POSITIVE — AB

## 2017-01-09 MED ORDER — MEDROXYPROGESTERONE ACETATE 10 MG PO TABS: 10.0000 mg | ORAL_TABLET | Freq: Every day | ORAL | 0 refills | Status: DC

## 2017-01-09 MED ORDER — CLOMIPHENE CITRATE 50 MG PO TABS: 50.0000 mg | ORAL_TABLET | Freq: Every day | ORAL | 1 refills | Status: DC

## 2017-01-09 NOTE — Progress Notes (Signed)
Chief complaint: 1. Endometriosis 2. Chronic pelvic pain 3. Nausea 4. Amenorrhea  Patient presents with mom to discuss management options for symptomatic endometriosis. Recent laparoscopy on 12/25/2016 demonstrated endometriosis of the cul-de-sac and uterosacral ligaments; uterus tubes and ovaries were normal area  Since surgery patient has had ongoing issues of chronic pain and nausea. Phenergan is helping control the nausea.  Extensive discussion regarding medical and surgical management options were reviewed for endometriosis. The patient is intolerant of birth control pills. The patient does not desire implants or IUDs for management of endometriosis. 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 The nasal spray GnRH analog Synarel is too expensive. The patient is desiring hysterectomy.  On further discussion, understanding that hysterectomy is not the answer at this time for her condition due to her age and lack of therapeutic interventions medically, the patient has decided that she would like to try and conceive. Primary infertility diagnosis criteria was reviewed. Recommendation is for patient to continue with UNprotected sex for 12 months before moving towards infertility evaluation and possible assisted reproductive technology. Patient and mom were comfortable with this. Because of oligo ovulation history, we discussed Clomid therapy and agreed to proceed with this intervention in an attempt to have monthly cycles and optimize potential for conception.    OBJECTIVE: BP 119/75   Pulse 85   Ht 5\' 4"  (1.626 m)   Wt 136 lb 8 oz (61.9 kg)   LMP 11/04/2016 (Exact Date)   BMI 23.43 kg/m  Physical exam-deferred  ASSESSMENT: 1. Endometriosis 2. Chronic pelvic pain 3. Nausea 4. Amenorrhea; history of oligo ovulation  PLAN: 1. Proceed with ovulation induction protocol with Clomid. Instructions are  given. 2. UPT  A total of 25 minutes were spent face-to-face with the patient during this encounter and over half of that time involved counseling and coordination of care.  Herold HarmsMartin A Teila Skalsky, MD  ADDENDUM: UPT-POSITIVE  Modified plan: 1. Prenatal vitamins daily 2. Return in 4 weeks for ultrasound to confirm fetal viability followed by establishment of prenatal care  Herold HarmsMartin A Ikram Riebe, MD

## 2017-01-09 NOTE — Patient Instructions (Addendum)
1. Urine pregnancy test today 2. Take Provera 10 mg a day for 10 days. This will cause a cycle 3. Begin Clomid therapy 50 mg a day on days 5 through 9 of her cycle 4. Perform timed intercourse on days 13 and 14 and 15 of cycle 5. Return on day 22 of her cycle for a serum progesterone test-day 22 6. Return in 6 weeks for follow-up 7. Take multivitamin or prenatal vitamin daily to help reduce risk for spina bifida   Ignore above instructions!  UPT-POSITIVE  1. Return in 4 weeks for ultrasound to confirm fetal viability 2. Take prenatal vitamin daily

## 2017-01-10 ENCOUNTER — Telehealth: Payer: Self-pay | Admitting: Obstetrics and Gynecology

## 2017-01-10 MED ORDER — DOXYLAMINE-PYRIDOXINE 10-10 MG PO TBEC: 10.0000 mg | DELAYED_RELEASE_TABLET | ORAL | 1 refills | Status: DC | PRN

## 2017-01-10 NOTE — Telephone Encounter (Signed)
Patient called requesting something for nausea. Thanks °

## 2017-01-10 NOTE — Telephone Encounter (Signed)
Pt states she is very nauseous. Has been vomiting since 5 am. Erx diclegis. Instructions given. Nausea/vomiting protocol reviewed. Pt voices understanding.

## 2017-01-25 ENCOUNTER — Telehealth: Payer: Self-pay

## 2017-01-25 NOTE — Telephone Encounter (Signed)
Early ob. Unsure of dates. Dating scan next week. At 3 am this morning she states she "blacked out". Has vomited x 5 since 3 am. NO food. Can not keep gatorade down.  Urination x 3. Dry mouth. C/o of back pain. 2 weeks ago offered b6 and unison. Given n/v protocol. Pt has not gotten b6 or uisom. States she was controlling it with small meals, gatorade and saltines. Only this am did it get so bad. Advised pt to go to er for fluids. Advised they may rx phenergan or zofran. Will contact pt tomorrow for f/u and be sure she has rx for over the weekend.

## 2017-01-26 MED ORDER — PROMETHAZINE HCL 25 MG PO TABS: 25.0000 mg | ORAL_TABLET | Freq: Four times a day (QID) | ORAL | 1 refills | Status: DC | PRN

## 2017-01-26 NOTE — Telephone Encounter (Signed)
LMTRC

## 2017-01-26 NOTE — Telephone Encounter (Signed)
Pt was seen at [facility] last nite for n/v. She ws given 2 bags of fluid and an nausea med. She feels better today. They gave her zofran but pt is scared to take it. Will give phenergan. Also advised b6 and unisom. N/v protocol reviewed. Pt voiced understanding.

## 2017-02-02 ENCOUNTER — Other Ambulatory Visit: Payer: Self-pay | Admitting: Obstetrics and Gynecology

## 2017-02-02 DIAGNOSIS — Z369 Encounter for antenatal screening, unspecified: Secondary | ICD-10-CM

## 2017-02-05 ENCOUNTER — Ambulatory Visit (INDEPENDENT_AMBULATORY_CARE_PROVIDER_SITE_OTHER): Payer: BLUE CROSS/BLUE SHIELD | Admitting: Obstetrics and Gynecology

## 2017-02-05 ENCOUNTER — Ambulatory Visit (INDEPENDENT_AMBULATORY_CARE_PROVIDER_SITE_OTHER): Payer: BLUE CROSS/BLUE SHIELD

## 2017-02-05 ENCOUNTER — Other Ambulatory Visit: Payer: Self-pay | Admitting: Obstetrics and Gynecology

## 2017-02-05 DIAGNOSIS — O021 Missed abortion: Secondary | ICD-10-CM

## 2017-02-05 DIAGNOSIS — Z369 Encounter for antenatal screening, unspecified: Secondary | ICD-10-CM | POA: Diagnosis not present

## 2017-02-05 NOTE — Progress Notes (Signed)
HPI:      [name] is a 26 y.o. G0P0000 who LMP was No LMP recorded.  Subjective:   She presents today For a follow-up after her ultrasound revealing no fetal pole. She underwent surgery for pelvic pain and endometriosis was discovered in February. She subsequently became pregnant and had [facility]. The patient states that she saw "a baby"at that time. (The official report shows no fetal pole present). She has had no significant cramping or bleeding. She had an ultrasound performed here today.    Hx: The following portions of the patient's history were reviewed and updated as appropriate:              She  has a past medical history of ADHD (attention deficit hyperactivity disorder); Anemia; Anxiety; Asthma; Back pain; Endometriosis; Headache; History of kidney stones; Ovarian cyst; and PID (pelvic inflammatory disease). She  does not have any pertinent problems on file. She  has a past surgical history that includes Tonsillectomy; laparoscopy; and laparoscopy (N/A, 12/25/2016). Her family history includes Breast cancer in her paternal grandmother; Diabetes in her maternal grandmother and mother; Heart disease in her maternal grandmother; Prostate cancer in her maternal uncle. She  reports that she has never smoked. She has quit using smokeless tobacco. She reports that she drinks alcohol. She reports that she uses drugs, including Marijuana. Current Outpatient Prescriptions on File Prior to Visit  Medication Sig Dispense Refill  . clomiPHENE (CLOMID) 50 MG tablet Take 1 tablet (50 mg total) by mouth daily. 5 tablet 1  . Doxylamine-Pyridoxine 10-10 MG TBEC Take 10 mg by mouth as needed. 2 at bedtime 1 in the am 1 in the pm No more than 4 a day 60 tablet 1  . ibuprofen (ADVIL,MOTRIN) 800 MG tablet Take 1 tablet (800 mg total) by mouth 3 (three) times daily. 30 tablet 1  . medroxyPROGESTERone (PROVERA) 10 MG tablet Take 1 tablet (10 mg total) by mouth  daily. 10 tablet 0  . ondansetron (ZOFRAN-ODT) 8 MG disintegrating tablet Take 1 tablet (8 mg total) by mouth every 8 (eight) hours as needed for nausea. 12 tablet 0  . oxyCODONE (OXY IR/ROXICODONE) 5 MG immediate release tablet Take 1 tablet (5 mg total) by mouth every 4 (four) hours as needed. 30 tablet 0  . promethazine (PHENERGAN) 25 MG tablet Take 1 tablet (25 mg total) by mouth every 8 (eight) hours as needed for nausea or vomiting. 15 tablet 0  . promethazine (PHENERGAN) 25 MG tablet Take 1 tablet (25 mg total) by mouth every 6 (six) hours as needed for nausea or vomiting. 30 tablet 1   No current facility-administered medications on file prior to visit.          Review of Systems:  Review of Systems  Constitutional: Denied constitutional symptoms, night sweats, recent illness, fatigue, fever, insomnia and weight loss.  Eyes: Denied eye symptoms, eye pain, photophobia, vision change and visual disturbance.  Ears/Nose/Throat/Neck: Denied ear, nose, throat or neck symptoms, hearing loss, nasal discharge, sinus congestion and sore throat.  Cardiovascular: Denied cardiovascular symptoms, arrhythmia, chest pain/pressure, edema, exercise intolerance, orthopnea and palpitations.  Respiratory: Denied pulmonary symptoms, asthma, pleuritic pain, productive sputum, cough, dyspnea and wheezing.  Gastrointestinal: Denied, gastro-esophageal reflux, melena, nausea and vomiting.  Genitourinary: Denied genitourinary symptoms including symptomatic vaginal discharge, pelvic relaxation issues, and urinary complaints.  Musculoskeletal: Denied musculoskeletal symptoms, stiffness, swelling, muscle weakness and myalgia.  Dermatologic: Denied dermatology symptoms, rash and scar.  Neurologic: Denied neurology symptoms, dizziness, headache, neck pain and syncope.  Psychiatric: Denied psychiatric symptoms, anxiety and depression.  Endocrine: Denied endocrine symptoms including hot flashes and night sweats.    Meds:   Current Outpatient Prescriptions on File Prior to Visit  Medication Sig Dispense Refill  . clomiPHENE (CLOMID) 50 MG tablet Take 1 tablet (50 mg total) by mouth daily. 5 tablet 1  . Doxylamine-Pyridoxine 10-10 MG TBEC Take 10 mg by mouth as needed. 2 at bedtime 1 in the am 1 in the pm No more than 4 a day 60 tablet 1  . ibuprofen (ADVIL,MOTRIN) 800 MG tablet Take 1 tablet (800 mg total) by mouth 3 (three) times daily. 30 tablet 1  . medroxyPROGESTERone (PROVERA) 10 MG tablet Take 1 tablet (10 mg total) by mouth daily. 10 tablet 0  . ondansetron (ZOFRAN-ODT) 8 MG disintegrating tablet Take 1 tablet (8 mg total) by mouth every 8 (eight) hours as needed for nausea. 12 tablet 0  . oxyCODONE (OXY IR/ROXICODONE) 5 MG immediate release tablet Take 1 tablet (5 mg total) by mouth every 4 (four) hours as needed. 30 tablet 0  . promethazine (PHENERGAN) 25 MG tablet Take 1 tablet (25 mg total) by mouth every 8 (eight) hours as needed for nausea or vomiting. 15 tablet 0  . promethazine (PHENERGAN) 25 MG tablet Take 1 tablet (25 mg total) by mouth every 6 (six) hours as needed for nausea or vomiting. 30 tablet 1   No current facility-administered medications on file prior to visit.     Objective:              The ultrasound findings from [facility] and from here were reviewed in detail with the patient.  The ultrasound reveals a [redacted] week gestational sac without fetal pole. This is 10 days from her last ultrasound at [facility] in which no fetal pole was seen.  Assessment:    G0P0000 Patient Active Problem List   Diagnosis Date Noted  . Endometriosis determined by laparoscopy 01/02/2017  . Syncope 12/31/2016  . Headache 12/21/2016  . Right lower quadrant abdominal pain 10/31/2016  . Family history of endometriosis in first degree relative 10/31/2016  . Female dyspareunia 10/31/2016  . Dysmenorrhea 10/31/2016  . Pelvic pain 10/31/2016  . Chronic pelvic pain in female 03/16/2016  .  Generalized anxiety disorder 03/02/2016  . Oligo-ovulation 11/17/2015     1. Missed abortion     Very likely this is a missed AB. No fetal pole is noted on either ultrasound 10 days apart and her likely dating makes her approximately [redacted] weeks gestation. We have discussed the possibility of miscarriage in detail. She would like further confirmation that this is indeed a miscarriage and because she is having no other symptoms I believe this is appropriate.  She would also like to follow-up with Dr. Algis Downs as he performed her surgery recently.   Plan:            1.  Ultrasound in 1 week. Quantitative beta hCG today.  SAb I have discussed the possibility of miscarriage with the patient.  I have informed her that vaginal bleeding, cramping or passage of tissue are the most common signs of miscarriage.  Should she have heavy bleeding, pass tissue or have other problems, she has been informed to call the office immediately.  I have advised her to remain at pelvic rest for at least one week after her last episode of bleeding.  If she is currently working,  I have instructed her to discontinue until this situation resolves.  Complete versus incomplete miscarriage was discussed and the patient is aware that should she develop heavy bleeding, fever, or persistent crampy pelvic pain she may require a D & E to remove the remaining portion of the miscarried pregnancy.  I advised her to keep me informed should her condition change.    No orders of the defined types were placed in this encounter.       F/U  Return in about 8 days (around 02/13/2017). I spent 26 minutes with this patient of which greater than 50% was spent discussing possibility of miscarriage, ultrasound findings from 10 days ago and today, medical follow-up with ultrasounds and quantitative beta hCGs.  Elonda Husky, M.D. 02/05/2017 9:41 AM

## 2017-02-06 LAB — BETA HCG QUANT (REF LAB): hCG Quant: 55399 m[IU]/mL

## 2017-02-11 ENCOUNTER — Other Ambulatory Visit: Payer: Self-pay | Admitting: Obstetrics and Gynecology

## 2017-02-11 DIAGNOSIS — O3680X0 Pregnancy with inconclusive fetal viability, not applicable or unspecified: Secondary | ICD-10-CM

## 2017-02-12 ENCOUNTER — Telehealth: Payer: Self-pay | Admitting: Obstetrics and Gynecology

## 2017-02-12 NOTE — Telephone Encounter (Signed)
Patient received results in MyChart, but was wondering if someone could go over her results with her and help her understand everything. Please advise

## 2017-02-12 NOTE — Telephone Encounter (Signed)
Spoke with pt- she will keep appointment with Dr Tommi Rumps tomorrow.

## 2017-02-13 ENCOUNTER — Ambulatory Visit (INDEPENDENT_AMBULATORY_CARE_PROVIDER_SITE_OTHER): Payer: BLUE CROSS/BLUE SHIELD | Admitting: Obstetrics and Gynecology

## 2017-02-13 ENCOUNTER — Encounter: Payer: Self-pay | Admitting: Obstetrics and Gynecology

## 2017-02-13 ENCOUNTER — Ambulatory Visit: Payer: BLUE CROSS/BLUE SHIELD

## 2017-02-13 VITALS — BP 105/68 | HR 85 | Ht 64.0 in | Wt 137.9 lb

## 2017-02-13 DIAGNOSIS — O021 Missed abortion: Secondary | ICD-10-CM

## 2017-02-13 DIAGNOSIS — O3680X0 Pregnancy with inconclusive fetal viability, not applicable or unspecified: Secondary | ICD-10-CM

## 2017-02-13 NOTE — Patient Instructions (Signed)
1. Serial ultrasounds have demonstrated no evidence of a viable pregnancy. Findings are consistent with a miscarriage 2. Options of management for miscarriage were reviewed 3. Suction D&C is scheduled for 02/19/2017 4. Preoperative appointment will be scheduled for anesthesia assessment

## 2017-02-13 NOTE — H&P (Signed)
Subjective:  PREOPERATIVE HISTORY AND PHYSICAL   Date of surgery: 02/19/2017 Diagnosis: Missed abortion Procedure: Suction D&C   Patient is a 26 y.o. [id number] scheduled for suction D&C due to missed abortion. Serial ultrasound 21 week apart demonstrated no significant change with an approximate 7.4 week intrauterine gestational sac is seen without fetal pole. Quantitative hCG titer is over 55,000 area patient has not been experiencing any vaginal bleeding or pelvic cramping to date.  Ultrasound: (02/13/2017) ULTRASOUND REPORT  Location: [facility] Date of Service: 02/13/17  Indications:Threatened AB Findings:  Intrauterine pregnancy is visualized with a GSD consistent with 7 4/[redacted] weeks gestation, giving an (U/S) EDD of 09/28/17. There is no change in appearance from prior ultrasound performed on 02/05/17. The (U/S) EDD is NOT consistent with the clinically established (LMP) EDD of 08/11/17.  FHR: No fetal heart beat seen. CRL: There is no definite fetal pole seen.   Yolk sac is present..  Right Ovary measures 3 x 2 x 2.2 cm. It is normal in appearance. Left Ovary measures 3 x 1.6 x 2 cm. It is normal appearance. There is no evidence of a corpus luteal cyst. Survey of the adnexa demonstrates no adnexal masses. There is no free peritoneal fluid in the cul de sac.  Impression: 1. 7 4/7 week non Viable Intrauterine pregnancy by U/S measurement of gestational sac. 2. (U/S) EDD is not  consistent with Clinically established (LMP) EDD of 08/11/17. 3. No growth or change in gestational sac from prior ultrasound 1 week ago. Findings are consistent with Missed abortion  Recommendations: 1.Clinical correlation with the patient's History and Physical Exam.   Boyce Medici Herold Harms, MD   Pertinent Gynecological History: 10/25/16 ultrasound revealed 1.8 cm simple cyst otherwise nml. 11/17/15 laparoscopy for ruptured ovarian cyst w/o endometriosis  dx. 12/25/2016 laparoscopy demonstrated endometriosis, excised and fulgurated Menarche Age 67 with oligomenorrhea (les than 10 / year)  LMP: 11/04/16 Contraception: none. Stopped OCP d/t reading that "they may cause ovarian cysts." Last mammogram: n/a  Last pap: n/a  OB History    Gravida Para Term Preterm AB Living   0 0 0 0 0 0   SAB TAB Ectopic Multiple Live Births   0 0 0 0 0      No LMP recorded (lmp unknown).    Past Medical History:  Diagnosis Date  . ADHD (attention deficit hyperactivity disorder)   . Anemia   . Anxiety   . Asthma   . Back pain   . Endometriosis   . Headache   . History of kidney stones    In the past  . Ovarian cyst   . PID (pelvic inflammatory disease)     Past Surgical History:  Procedure Laterality Date  . LAPAROSCOPY    . LAPAROSCOPY N/A 12/25/2016   Procedure: LAPAROSCOPY DIAGNOSTIC WITH BIOPSIES;  Surgeon: Herold Harms, MD;  Location: [facility];  Service: Gynecology;  Laterality: N/A;  . TONSILLECTOMY      OB History  Gravida Para Term Preterm AB Living  0 0 0 0 0 0  SAB TAB Ectopic Multiple Live Births  0 0 0 0 0        Social History   Social History  . Marital status: Single    Spouse name: N/A  . Number of children: N/A  . Years of education: N/A   Social History Main Topics  . Smoking status: Never Smoker  . Smokeless tobacco: Former Neurosurgeon  . Alcohol use Yes  Comment: RARE  . Drug use: Yes    Types: Marijuana     Comment: DAILY- X 4 YEARS  . Sexual activity: Yes    Birth control/ protection: None   Other Topics Concern  . None   Social History Narrative  . None    Family History  Problem Relation Age of Onset  . Diabetes Mother   . Diabetes Maternal Grandmother   . Heart disease Maternal Grandmother     GRT ALSO  . Breast cancer Paternal Grandmother     GRT  . Prostate cancer Maternal Uncle   . Ovarian cancer Neg Hx   . Colon cancer Neg Hx      (Not in a hospital  admission)  Allergies  Allergen Reactions  . Tramadol Nausea And Vomiting and Other (See Comments)    cramping  . Hydrocodone Itching  . Ketorolac Nausea And Vomiting  . Morphine And Related Itching    Review of Systems Constitutional: No recent fever/chills/sweats Respiratory: No recent cough/bronchitis Cardiovascular: No chest pain Gastrointestinal: No recent nausea/vomiting/diarrhea Genitourinary: No UTI symptoms Hematologic/lymphatic:No history of coagulopathy or recent blood thinner use    Objective:    BP 105/68   Pulse 85   Ht 5' 4" (1.626 m)   Wt 137 lb 14.4 oz (62.6 kg)   LMP  (LMP Unknown)   BMI 23.67 kg/m   General:   Normal  Skin:   normal  HEENT:  Normal  Neck:  Supple without Adenopathy or Thyromegaly  Lungs:   Heart:              Breasts:   Abdomen:  Pelvis:  M/S   Extremeties:  Neuro:    clear to auscultation bilaterally   Normal without murmur   Not Examined   soft, non-tender; bowel sounds normal; no masses,  no organomegaly   Exam deferred to OR  No CVAT  Warm/Dry   Normal         Assessment:    Missed abortion   Plan:  Suction D&C    Preop counseling: Patient is to undergo suction D&C for missed abortion on 02/19/2017. She is understanding of the planned procedure and is aware of and is accepting of all surgical risks which include but are not limited to bleeding, infection, pelvic organ injury with need for repair, blood clot disorders, anesthesia risks, etc. All questions have been answered. Informed consent is given. Patient is ready and willing to proceed with surgery as scheduled.  Jurnie Garritano A Kiko Ripp, MD  Note: This dictation was prepared with Dragon dictation along with smaller phrase technology. Any transcriptional errors that result from this process are unintentional.    

## 2017-02-13 NOTE — Progress Notes (Signed)
Chief complaint: 1. Follow-up on ultrasound 2. Missed abortion  Patient presents today for follow-up assessment. Previous ultrasound 1 week ago in [facility] demonstrated an intrauterine gestational sac without fetal pole. She is not experiencing any vaginal bleeding or pelvic cramping. The ultrasound was not consistent with her last menstrual period gestational age. Subsequent quantitative hCG titer was 55,000 Repeat ultrasound today demonstrates no change in findings with an intrauterine gestational sac being present without development of a fetal pole. These findings are consistent with missed abortion.  ASSESSMENT: 1. Missed abortion; serial ultrasound 2 and the stratus no change in findings without evidence of intrauterine fetal pole 2. No evidence of cramping or bleeding suggesting miscarriage  PLAN: 1. Options of management for this nonviable pregnancy were reviewed including expectant management versus medical treatment of miscarriage with Cytotec versus suction D&C and outpatient surgery. 2. Patient elects to proceed with suction D&C. Surgery will be scheduled for 02/19/2017 3. If bleeding or cramping developed, return to office.  A total of 15 minutes were spent face-to-face with the patient during this encounter and over half of that time dealt with counseling and coordination of care.  Herold Harms, MD  Note: This dictation was prepared with Dragon dictation along with smaller phrase technology. Any transcriptional errors that result from this process are unintentional.

## 2017-02-15 ENCOUNTER — Encounter
Admission: RE | Admit: 2017-02-15 | Discharge: 2017-02-15 | Disposition: A | Discharge status: Home / Self Care | Payer: BLUE CROSS/BLUE SHIELD | Source: Ambulatory Visit | Attending: Obstetrics and Gynecology | Admitting: Obstetrics and Gynecology

## 2017-02-15 DIAGNOSIS — Z01818 Encounter for other preprocedural examination: Secondary | ICD-10-CM | POA: Diagnosis not present

## 2017-02-15 HISTORY — DX: Other specified personal risk factors, not elsewhere classified: Z91.89

## 2017-02-15 LAB — TYPE AND SCREEN
ABO/RH(D): A POS
ANTIBODY SCREEN: NEGATIVE
Extend sample reason: UNDETERMINED

## 2017-02-15 NOTE — Patient Instructions (Signed)
Your procedure is scheduled on: Monday 02/19/17 Report to DAY SURGERY. 2ND FLOOR MEDICAL MALL ENTRANCE. To find out your arrival time please call [phone] between 1PM - 3PM on Friday 02/16/17.  Remember: Instructions that are not followed completely may result in serious medical risk, up to and including death, or upon the discretion of your surgeon and anesthesiologist your surgery may need to be rescheduled.    __X__ 1. Do not eat food or drink liquids after midnight. No gum chewing or hard candies.     __X__ 2. No Alcohol for 24 hours before or after surgery.   ____ 3. Bring all medications with you on the day of surgery if instructed.    __X__ 4. Notify your doctor if there is any change in your medical condition     (cold, fever, infections).             __X___5. No smoking within 24 hours of your surgery.     Do not wear jewelry, make-up, hairpins, clips or nail polish.  Do not wear lotions, powders, or perfumes.   Do not shave 48 hours prior to surgery. Men may shave face and neck.  Do not bring valuables to the hospital.    [facility] is not responsible for any belongings or valuables.               Contacts, dentures or bridgework may not be worn into surgery.  Leave your suitcase in the car. After surgery it may be brought to your room.  For patients admitted to the hospital, discharge time is determined by your                treatment team.   Patients discharged the day of surgery will not be allowed to drive home.   Please read over the following fact sheets that you were given:   Pain Booklet and MRSA Information   ____ Take these medicines the morning of surgery with A SIP OF WATER:    1. NONE  2.   3.   4.  5.  6.  ____ Fleet Enema (as directed)   ____ Use CHG Soap as directed  ____ Use inhalers on the day of surgery  ____ Stop metformin 2 days prior to surgery    ____ Take 1/2 of usual insulin dose the night before surgery and none on the morning of  surgery.   ____ Stop Coumadin/Plavix/aspirin on   __X__ Stop Anti-inflammatories such as Advil, Aleve, Ibuprofen, Motrin, Naproxen, Naprosyn, Goodies,powder, or aspirin products.  OK to take Tylenol.   ____ Stop supplements until after surgery.    ____ Bring C-Pap to the hospital.

## 2017-02-16 ENCOUNTER — Telehealth: Payer: Self-pay | Admitting: Obstetrics and Gynecology

## 2017-02-16 NOTE — Addendum Note (Signed)
Addended by: Sharon Seller on: 02/16/2017 08:49 AM   Modules accepted: Orders, SmartSet

## 2017-02-16 NOTE — Telephone Encounter (Signed)
lmtrc. Per JML may send in tramadol or tylenol 3.

## 2017-02-16 NOTE — Telephone Encounter (Signed)
Pt states her pp is worse but no vb. Advsied ibup 800 q8 and tylenol q4h. Pt aware if cramps not relieved with otc meds or bleeding heavy she will need to go to ER. Pt voices understanding.

## 2017-02-16 NOTE — Pre-Procedure Instructions (Signed)
Faxed request for pre-op orders to Dr. Greggory Keen.

## 2017-02-16 NOTE — Telephone Encounter (Signed)
Patient called stating she is having a lot of abdominal cramping. She is scheduled for D&C on Monday.

## 2017-02-17 ENCOUNTER — Emergency Department (HOSPITAL_COMMUNITY)
Admission: EM | Admit: 2017-02-17 | Discharge: 2017-02-17 | Disposition: A | Discharge status: Home / Self Care | Payer: BLUE CROSS/BLUE SHIELD | Attending: Physician Assistant | Admitting: Physician Assistant

## 2017-02-17 ENCOUNTER — Encounter (HOSPITAL_COMMUNITY): Payer: Self-pay | Admitting: *Deleted

## 2017-02-17 ENCOUNTER — Emergency Department (HOSPITAL_COMMUNITY): Payer: BLUE CROSS/BLUE SHIELD

## 2017-02-17 DIAGNOSIS — R1030 Lower abdominal pain, unspecified: Secondary | ICD-10-CM | POA: Diagnosis not present

## 2017-02-17 DIAGNOSIS — J45909 Unspecified asthma, uncomplicated: Secondary | ICD-10-CM | POA: Insufficient documentation

## 2017-02-17 DIAGNOSIS — F909 Attention-deficit hyperactivity disorder, unspecified type: Secondary | ICD-10-CM | POA: Diagnosis not present

## 2017-02-17 DIAGNOSIS — Z3A08 8 weeks gestation of pregnancy: Secondary | ICD-10-CM | POA: Diagnosis not present

## 2017-02-17 DIAGNOSIS — Z79899 Other long term (current) drug therapy: Secondary | ICD-10-CM | POA: Diagnosis not present

## 2017-02-17 DIAGNOSIS — O208 Other hemorrhage in early pregnancy: Secondary | ICD-10-CM | POA: Diagnosis not present

## 2017-02-17 DIAGNOSIS — O26891 Other specified pregnancy related conditions, first trimester: Secondary | ICD-10-CM | POA: Diagnosis not present

## 2017-02-17 DIAGNOSIS — N939 Abnormal uterine and vaginal bleeding, unspecified: Secondary | ICD-10-CM

## 2017-02-17 LAB — CBC
HEMATOCRIT: 39.3 % (ref 36.0–46.0)
HEMOGLOBIN: 13.6 g/dL (ref 12.0–15.0)
MCH: 30.9 pg (ref 26.0–34.0)
MCHC: 34.6 g/dL (ref 30.0–36.0)
MCV: 89.3 fL (ref 78.0–100.0)
Platelets: 241 10*3/uL (ref 150–400)
RBC: 4.4 MIL/uL (ref 3.87–5.11)
RDW: 11.9 % (ref 11.5–15.5)
WBC: 8.1 10*3/uL (ref 4.0–10.5)

## 2017-02-17 LAB — URINALYSIS, ROUTINE W REFLEX MICROSCOPIC
Bilirubin Urine: NEGATIVE
GLUCOSE, UA: NEGATIVE mg/dL
KETONES UR: NEGATIVE mg/dL
Nitrite: NEGATIVE
PROTEIN: NEGATIVE mg/dL
Specific Gravity, Urine: 1.002 — ABNORMAL LOW (ref 1.005–1.030)
pH: 8 (ref 5.0–8.0)

## 2017-02-17 LAB — I-STAT BETA HCG BLOOD, ED (MC, WL, AP ONLY): I-stat hCG, quantitative: >2000 m[IU]/mL — ABNORMAL HIGH (ref <5)

## 2017-02-17 LAB — ABO/RH: ABO/RH(D): A POS

## 2017-02-17 IMAGING — US US OB TRANSVAGINAL
1 series · 13 of 28 positions shown · non-contrast
Comparison: [DATE]

CLINICAL DATA: Miscarriage 4 days ago now with persistent cramping
and spotting. LMP unknown. Quantitative beta HCG greater than [55]
which is decreased from previous.

EXAM:
OBSTETRIC <14 WK US AND TRANSVAGINAL OB US
TECHNIQUE: Both transabdominal and transvaginal ultrasound examinations were
performed for complete evaluation of the gestation as well as the
maternal uterus, adnexal regions, and pelvic cul-de-sac.
Transvaginal technique was performed to assess early pregnancy.

[Series 1: us ob transvaginal · 0.17mm/px · 13 of 93 slices shown]
[im 4/93]
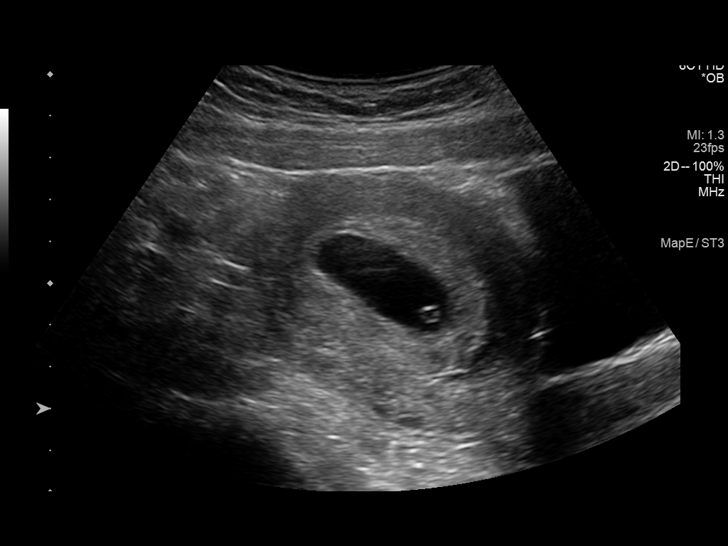
[im 11/93]
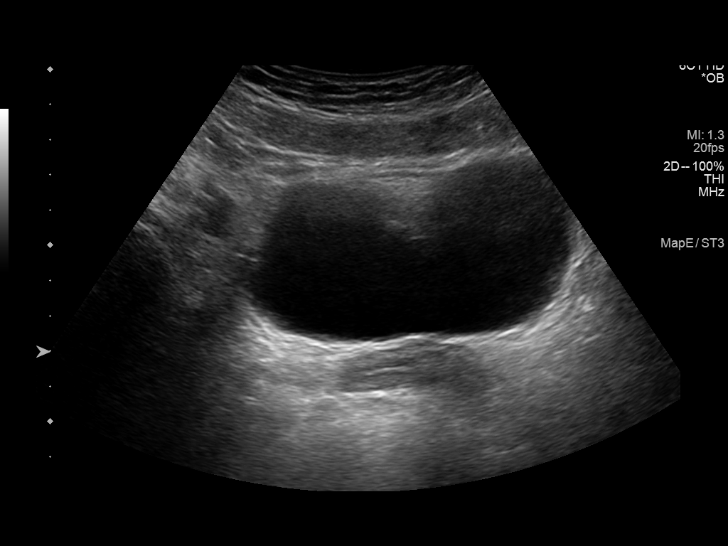
[im 18/93]
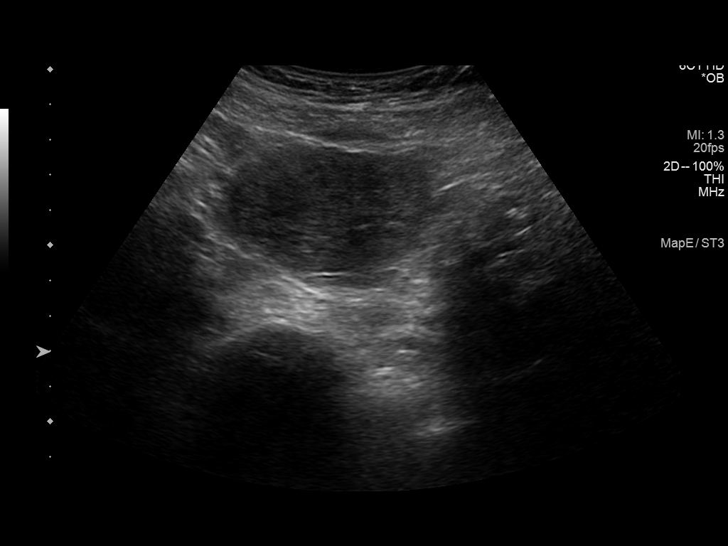
[im 24/93]
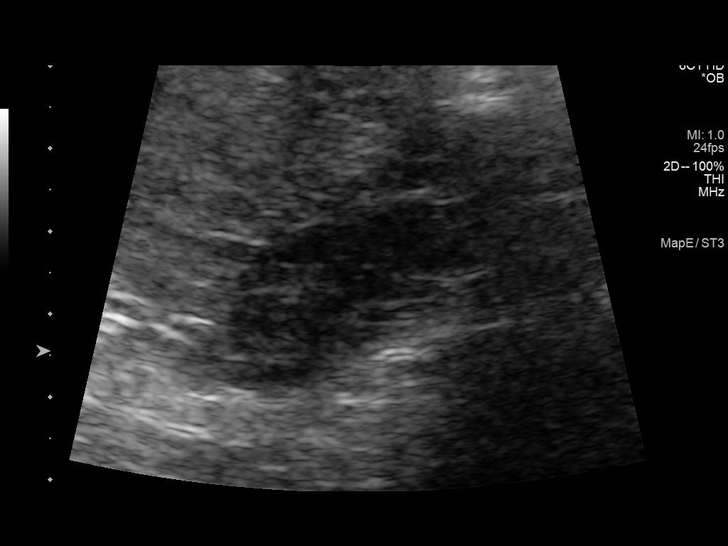
[im 31/93]
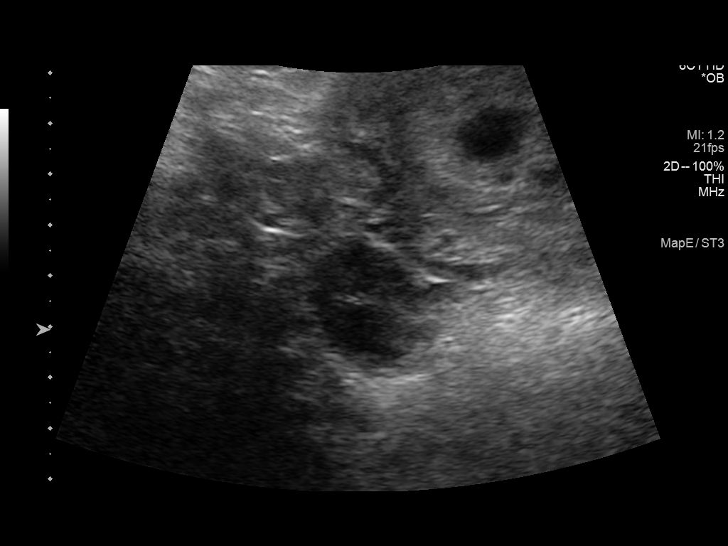
[im 38/93]
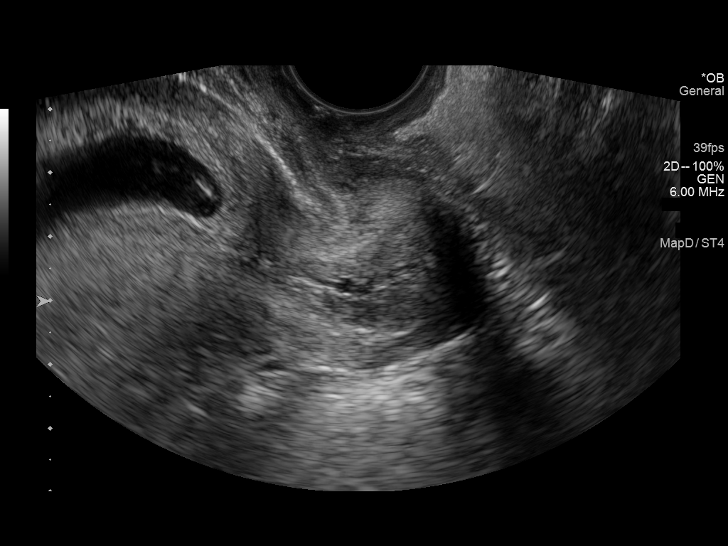
[im 48/93]
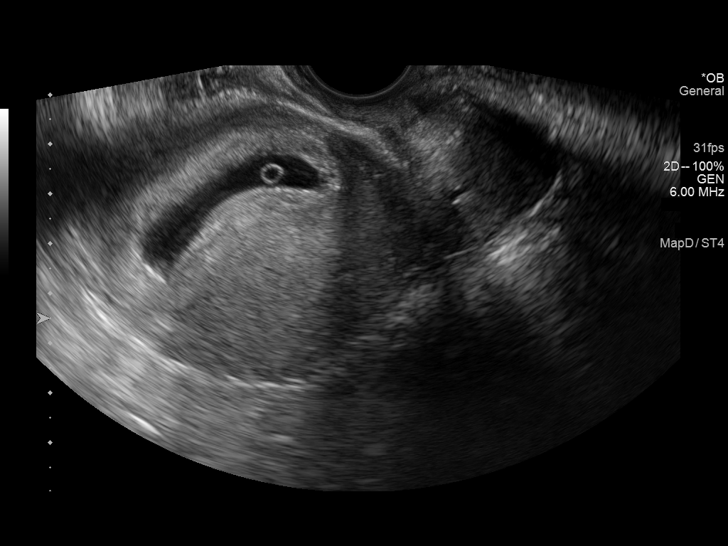
[im 55/93]
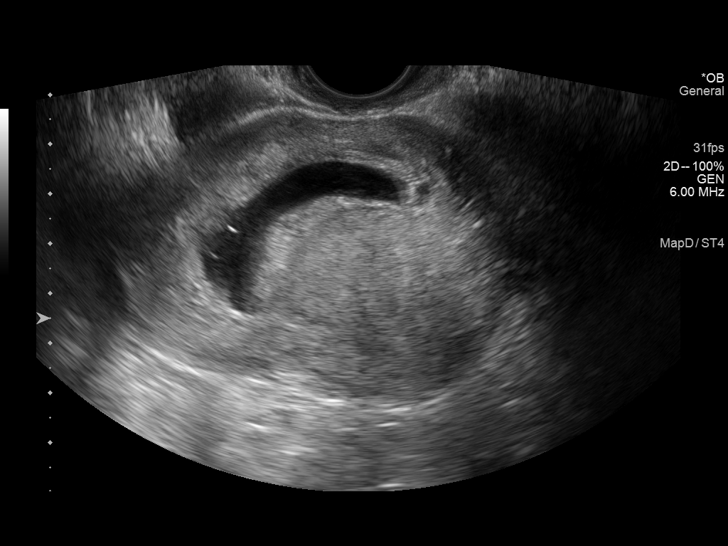
[im 62/93]
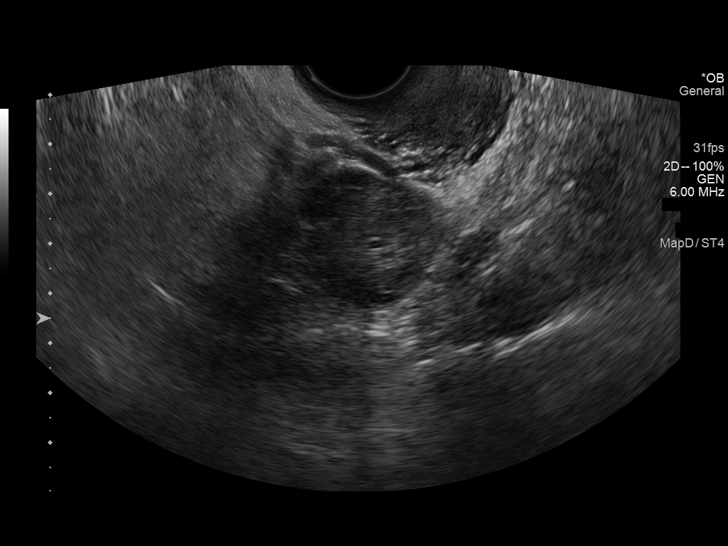
[im 69/93]
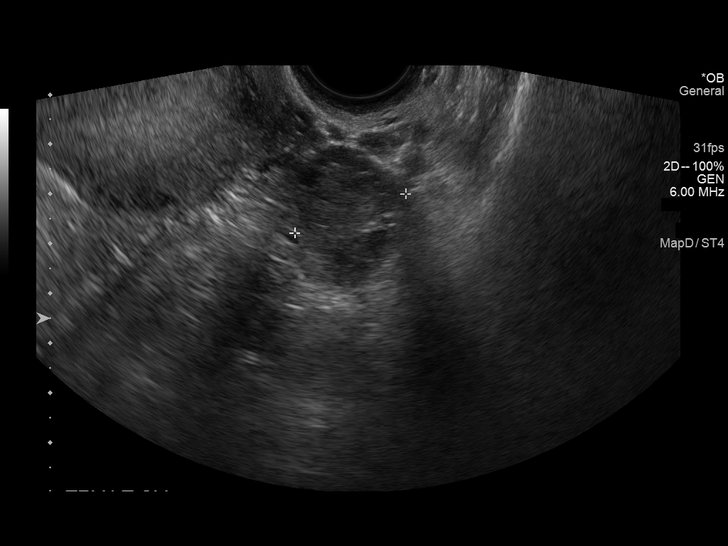
[im 75/93]
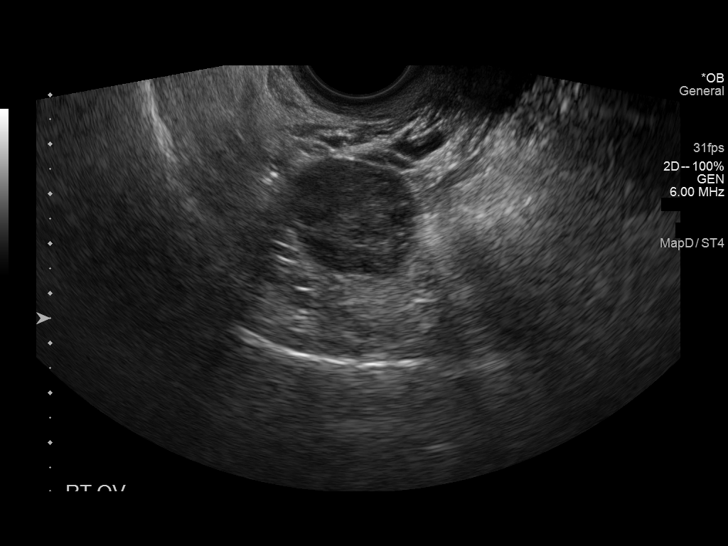
[im 82/93]
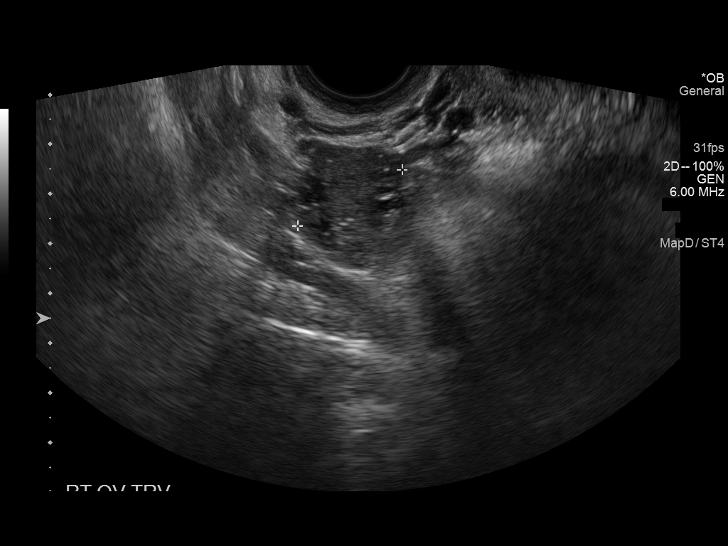
[im 89/93]
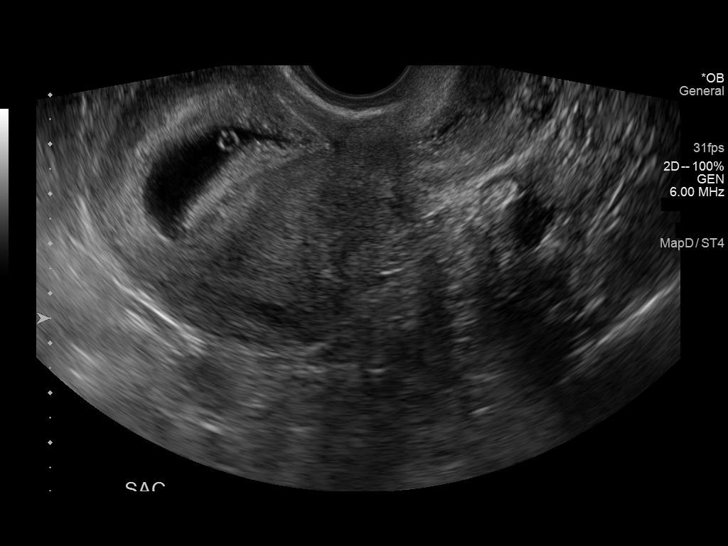

[13 of 28 positions shown; findings below may reference images not displayed]

FINDINGS: Intrauterine gestational sac: Single somewhat elongated gestational
sac visualized.

Yolk sac: Visualized. Possible second thin walled larger yolk sac
present.

Embryo:  Not visualized.

Cardiac Activity: Not visualized.

Heart Rate: Not visualized.  Bpm

MSD: 29.8  mm   8 w   1  d

Subchorionic hemorrhage:  None visualized.

Maternal uterus/adnexae: Ovaries are normal.

Note that the previous images from [DATE] demonstrated an
intrauterine sac with large single yolk sac and mean sac diameter
compatible with estimated gestational age 7 weeks 4 days.
IMPRESSION: Somewhat elongated intrauterine gestational sac with
normal-appearing yolk sac and possible second abnormal larger yolk
sac. No fetal pole or cardiac activity demonstrated. Recommend
direct correlation with patient's serial quantitative beta HCG and
recent previous ultrasounds as this likely represents failed
pregnancy. Note that patient has been followed by OB GYN service and
is scheduled for D and C [REDACTED] [DATE].

## 2017-02-17 MED ORDER — OXYCODONE-ACETAMINOPHEN 5-325 MG PO TABS
1.0000 | ORAL_TABLET | Freq: Once | ORAL | Status: AC
Start: 2017-02-17 — End: 2017-02-17
  Administered 2017-02-17: 1 via ORAL
  Filled 2017-02-17: qty 1

## 2017-02-17 MED ORDER — OXYCODONE-ACETAMINOPHEN 5-325 MG PO TABS: 2.0000 | ORAL_TABLET | ORAL | 0 refills | Status: DC | PRN

## 2017-02-17 MED ORDER — FENTANYL CITRATE (PF) 100 MCG/2ML IJ SOLN
25.0000 ug | Freq: Once | INTRAMUSCULAR | Status: AC
  Administered 2017-02-17: 25 ug via INTRAVENOUS
  Filled 2017-02-17: qty 2

## 2017-02-17 NOTE — Discharge Instructions (Addendum)
Try ibuprofen or Tylenol for pain relief. Take Percocet only as needed for severe pain. Please use warm compress to lower abdomen to help relieve pain. Make sure to go to your scheduled appointment on Monday with your obstetrician.   Get help right away if: Your pain does not go away as soon as your health care provider told you to expect. You cannot stop throwing up. Your pain is only in areas of the abdomen, such as the right side or the left lower portion of the abdomen. You have bloody or black stools, or stools that look like tar. You have severe pain, cramping, or bloating in your abdomen. You have signs of dehydration, such as: Dark urine, very little urine, or no urine. Cracked lips. Dry mouth. Sunken eyes. Sleepiness. Weakness.

## 2017-02-17 NOTE — ED Provider Notes (Signed)
[facility]-EMERGENCY DEPT Provider Note   CSN: [id number] Arrival date & time: 02/17/17  1517     History   Chief Complaint Chief Complaint  Patient presents with  . Abdominal Pain    HPI Abigail Harding is a 26 y.o. female with PMHx of ADHD, endometriosis, ovarian cyst, recent miscarriage with severe lower abdominal cramping x 2 days. She states she had intermittent cramping pain yesterday and has worsened since then and now is constant. She also reports spotting that started today. She describes her pain as worsening, constant, sharp, pressure, 8/10 pain. She reports associated sharp pain in vagina, vaginal discharge, shortness of breath, subjective fever last night, nausea. She reports not trying anything for her pain. She denies dysuria, chills, vomiting, back pain. She reports an episode of intense drinking Tuesday night when she found out her results that she would have a miscarriage and finding out her grandmother passed away. She states this is the first time she has been pregnant. She denies hx of COPD, smoking. She states she stopped smoking marijuana in January. She admits to hx of asthma.    Per EMR Patient has had a recent positive pregnancy however has been seen at [facility] and by her GYN showing no fetal pole multiple times. She has a scheduled appointment for a D&E on Monday.   Outpatient office of her gynecologist showed subsequent quantitative hCG titer was 55,000. She had repeat ultrasound with no changes and findings with him intrauterine gestational sac being present without development of fetal pole. Approximate 7.4 week intrauterine gestational sac.   The history is provided by the patient. No language interpreter was used.    Past Medical History:  Diagnosis Date  . ADHD (attention deficit hyperactivity disorder)   . Anemia   . Anxiety   . Asthma   . Back pain   . Endometriosis   . Headache   . History of fainting   . History of kidney stones    In the past  .  Ovarian cyst   . PID (pelvic inflammatory disease)     Patient Active Problem List   Diagnosis Date Noted  . Endometriosis determined by laparoscopy 01/02/2017  . Syncope 12/31/2016  . Headache 12/21/2016  . Right lower quadrant abdominal pain 10/31/2016  . Family history of endometriosis in first degree relative 10/31/2016  . Female dyspareunia 10/31/2016  . Dysmenorrhea 10/31/2016  . Pelvic pain 10/31/2016  . Chronic pelvic pain in female 03/16/2016  . Generalized anxiety disorder 03/02/2016  . Oligo-ovulation 11/17/2015    Past Surgical History:  Procedure Laterality Date  . LAPAROSCOPY    . LAPAROSCOPY N/A 12/25/2016   Procedure: LAPAROSCOPY DIAGNOSTIC WITH BIOPSIES;  Surgeon: Herold Harms, MD;  Location: [facility] ORS;  Service: Gynecology;  Laterality: N/A;  . TONSILLECTOMY      OB History    Gravida Para Term Preterm AB Living   0 0 0 0 0 0   SAB TAB Ectopic Multiple Live Births   0 0 0 0 0       Home Medications    Prior to Admission medications   Medication Sig Start Date End Date Taking? Authorizing Provider  Prenatal Vit-Fe Fumarate-FA (PRENATAL MULTIVITAMIN) TABS tablet Take 1 tablet by mouth daily at 12 noon.   Yes Historical Provider, MD  promethazine (PHENERGAN) 25 MG tablet Take 1 tablet (25 mg total) by mouth every 6 (six) hours as needed for nausea or vomiting. 01/26/17  Yes Herold Harms, MD  oxyCODONE-acetaminophen (PERCOCET/ROXICET) 5-325 MG tablet Take 2 tablets by mouth every 4 (four) hours as needed for severe pain. 02/17/17   Joli Koob Orson Aloe, [place]    Family History Family History  Problem Relation Age of Onset  . Diabetes Mother   . Diabetes Maternal Grandmother   . Heart disease Maternal Grandmother     GRT ALSO  . Breast cancer Paternal Grandmother     GRT  . Prostate cancer Maternal Uncle   . Ovarian cancer Neg Hx   . Colon cancer Neg Hx     Social History Social History  Substance Use Topics  . Smoking status:  Never Smoker  . Smokeless tobacco: Former Neurosurgeon  . Alcohol use Yes     Comment: RARE     Allergies   Hydrocodone; Ketorolac; Morphine and related; and Tramadol   Review of Systems Review of Systems  Constitutional: Positive for fever. Negative for chills.  Respiratory: Positive for shortness of breath.   Cardiovascular: Negative for chest pain.  Gastrointestinal: Positive for abdominal pain and nausea. Negative for blood in stool, diarrhea and vomiting.  Genitourinary: Positive for frequency, vaginal bleeding, vaginal discharge and vaginal pain. Negative for difficulty urinating and dysuria.  Musculoskeletal: Negative for back pain.  Skin: Negative for wound.  All other systems reviewed and are negative.    Physical Exam Updated Vital Signs BP 125/89 (BP Location: Right Arm)   Pulse 94   Temp 97.9 F (36.6 C) (Oral)   Resp 18   Wt 63.5 kg   SpO2 98%   BMI 24.03 kg/m   Physical Exam  Constitutional: She is oriented to person, place, and time. She appears well-developed and well-nourished.  Well appearing  HENT:  Head: Normocephalic and atraumatic.  Nose: Nose normal.  Mouth/Throat: Oropharynx is clear and moist.  Eyes: Conjunctivae and EOM are normal. Pupils are equal, round, and reactive to light.  Neck: Normal range of motion.  Cardiovascular: Normal rate, normal heart sounds and intact distal pulses.   No murmur heard. Pulmonary/Chest: Effort normal and breath sounds normal. No respiratory distress. She has no wheezes. She has no rales.  Normal work of breathing. No respiratory distress noted. Airway patent.   Abdominal: Soft. Bowel sounds are normal. There is tenderness. There is guarding. There is no rebound.  Patient with tenderness throughout lower abdomen and mild guarding. No rebound tenderness. No CVA tenderness. No focal tenderness at McBurney's point. Negative Murphy sign.  Musculoskeletal: Normal range of motion.  Neurological: She is alert and oriented  to person, place, and time.  Skin: Skin is warm. Capillary refill takes less than 2 seconds.  Psychiatric: She has a normal mood and affect. Her behavior is normal.  Nursing note and vitals reviewed.    ED Treatments / Results  Labs (all labs ordered are listed, but only abnormal results are displayed) Labs Reviewed  URINALYSIS, ROUTINE W REFLEX MICROSCOPIC - Abnormal; Notable for the following:       Result Value   Color, Urine STRAW (*)    Specific Gravity, Urine 1.002 (*)    Hgb urine dipstick SMALL (*)    Leukocytes, UA TRACE (*)    Bacteria, UA MANY (*)    Squamous Epithelial / LPF 0-5 (*)    All other components within normal limits  I-STAT BETA HCG BLOOD, ED ([facility], [facility], [facility] ONLY) - Abnormal; Notable for the following:    I-stat hCG, quantitative >2,000.0 (*)    All other components within normal limits  URINE  CULTURE  CBC  ABO/RH    EKG  EKG Interpretation None       Radiology US Ob Comp < 14 Wks  Result Date: 02/17/2017 CLINICAL DATA:  Miscarriage 4 days ago now with persistent cramping and spotting. LMP unknown. Quantitative beta HCG greater than 2000 which is decreased from previous. EXAM: OBSTETRIC <14 WK [place] AND TRANSVAGINAL OB US TECHNIQUE: Both transabdominal and transvaginal ultrasound examinations were performed for complete evaluation of the gestation as well as the maternal uterus, adnexal regions, and pelvic cul-de-sac. Transvaginal technique was performed to assess early pregnancy. COMPARISON:  02/13/2017 FINDINGS: Intrauterine gestational sac: Single somewhat elongated gestational sac visualized. Yolk sac: Visualized. Possible second thin walled larger yolk sac present. Embryo:  Not visualized. Cardiac Activity: Not visualized. Heart Rate: Not visualized.  Bpm MSD: 29.8  mm   8 w   1  d Subchorionic hemorrhage:  None visualized. Maternal uterus/adnexae: Ovaries are normal. Note that the previous images from 02/05/2017 demonstrated an intrauterine sac with large  single yolk sac and mean sac diameter compatible with estimated gestational age 519 weeks 4 days. IMPRESSION: Somewhat elongated intrauterine gestational sac with normal-appearing yolk sac and possible second abnormal larger yolk sac. No fetal pole or cardiac activity demonstrated. Recommend direct correlation with patient's serial quantitative beta HCG and recent previous ultrasounds as this likely represents failed pregnancy. Note that patient has been followed by [facility] GYN service and is scheduled for D and C Monday 02/19/2017. Electronically Signed   By: Elberta Fortis M.D.   On: 02/17/2017 17:54   US Ob Transvaginal  Result Date: 02/17/2017 CLINICAL DATA:  Miscarriage 4 days ago now with persistent cramping and spotting. LMP unknown. Quantitative beta HCG greater than 2000 which is decreased from previous. EXAM: OBSTETRIC <14 WK [place] AND TRANSVAGINAL OB US TECHNIQUE: Both transabdominal and transvaginal ultrasound examinations were performed for complete evaluation of the gestation as well as the maternal uterus, adnexal regions, and pelvic cul-de-sac. Transvaginal technique was performed to assess early pregnancy. COMPARISON:  02/13/2017 FINDINGS: Intrauterine gestational sac: Single somewhat elongated gestational sac visualized. Yolk sac: Visualized. Possible second thin walled larger yolk sac present. Embryo:  Not visualized. Cardiac Activity: Not visualized. Heart Rate: Not visualized.  Bpm MSD: 29.8  mm   8 w   1  d Subchorionic hemorrhage:  None visualized. Maternal uterus/adnexae: Ovaries are normal. Note that the previous images from 02/05/2017 demonstrated an intrauterine sac with large single yolk sac and mean sac diameter compatible with estimated gestational age 519 weeks 4 days. IMPRESSION: Somewhat elongated intrauterine gestational sac with normal-appearing yolk sac and possible second abnormal larger yolk sac. No fetal pole or cardiac activity demonstrated. Recommend direct correlation with patient's  serial quantitative beta HCG and recent previous ultrasounds as this likely represents failed pregnancy. Note that patient has been followed by [facility] GYN service and is scheduled for D and C Monday 02/19/2017. Electronically Signed   By: Elberta Fortis M.D.   On: 02/17/2017 17:54    Procedures Procedures (including critical care time)  Medications Ordered in ED Medications  oxyCODONE-acetaminophen (PERCOCET/ROXICET) 5-325 MG per tablet 1 tablet (not administered)  fentaNYL (SUBLIMAZE) injection 25 mcg (25 mcg Intravenous Given 02/17/17 1801)     Initial Impression / Assessment and Plan / ED Course  I have reviewed the triage vital signs and the nursing notes.  Pertinent labs & imaging results that were available during my care of the patient were reviewed by me and considered in my medical decision making (  see chart for details).    Patient here with lower abdominal pain for 2 days. One day of spotting. Heart and lung sounds are clear. Patient did have tenderness to lower abdomen. Otherwise soft. Mild guarding. No rebound tenderness. Patient had pelvic ultrasound done which caused her pain and declined pelvic exam due to not wanting to cause more pain. Patient is closely followed by her OB who already diagnosed her with a missed abortion 4 days ago and scheduled to have a D&E in 2 days.Patient verbalized understanding and is agreeable with assessment and plan. Reasons to immediately return to ED discussed. Patient encouraged to go to [facility] for any new or worsening symptoms for more specialized care. I have reviewed the [place] Controlled Substance Reporting System.   I spoke with Dr. Hildred Laser who recommended pain management and for her to go to her scheduled surgical procedure on Monday.  Dr. Corlis Leak also saw and evaluated patient and agrees with assessment and plan.    Final Clinical Impressions(s) / ED Diagnoses   Final diagnoses:  Vaginal bleeding    Lower abdominal pain    New Prescriptions New Prescriptions   OXYCODONE-ACETAMINOPHEN (PERCOCET/ROXICET) 5-325 MG TABLET    Take 2 tablets by mouth every 4 (four) hours as needed for severe pain.     [address] [place], [place] 02/17/17 1835    Abelino Derrick, MD 02/18/17 [phone]

## 2017-02-17 NOTE — ED Triage Notes (Signed)
Pt states she had a miscarriage 4 days ago and is scheduled to have D&C on Monday. Pt states she has had severe abdominal cramping and spotting x 2 days.

## 2017-02-17 NOTE — ED Notes (Signed)
Bed: WA21 Expected date:  Expected time:  Means of arrival:  Comments: 26 yo flu-like s/sx

## 2017-02-18 LAB — URINE CULTURE: Culture: NO GROWTH

## 2017-02-19 ENCOUNTER — Encounter
Admission: RE | Disposition: A | Discharge status: Home / Self Care | Payer: Self-pay | Source: Ambulatory Visit | Attending: Obstetrics and Gynecology

## 2017-02-19 ENCOUNTER — Ambulatory Visit: Payer: BLUE CROSS/BLUE SHIELD | Admitting: Anesthesiology

## 2017-02-19 ENCOUNTER — Ambulatory Visit
Admission: RE | Admit: 2017-02-19 | Discharge: 2017-02-19 | Disposition: A | Discharge status: Home / Self Care | Payer: BLUE CROSS/BLUE SHIELD | Source: Ambulatory Visit | Attending: Obstetrics and Gynecology | Admitting: Obstetrics and Gynecology

## 2017-02-19 DIAGNOSIS — Z3A01 Less than 8 weeks gestation of pregnancy: Secondary | ICD-10-CM | POA: Insufficient documentation

## 2017-02-19 DIAGNOSIS — Z9889 Other specified postprocedural states: Secondary | ICD-10-CM

## 2017-02-19 DIAGNOSIS — O021 Missed abortion: Secondary | ICD-10-CM | POA: Insufficient documentation

## 2017-02-19 HISTORY — PX: DILATION AND EVACUATION: SHX1459

## 2017-02-19 LAB — TYPE AND SCREEN
ABO/RH(D): A POS
Antibody Screen: NEGATIVE

## 2017-02-19 SURGERY — DILATION AND EVACUATION, UTERUS
Anesthesia: General | Wound class: Clean Contaminated

## 2017-02-19 MED ORDER — FENTANYL CITRATE (PF) 100 MCG/2ML IJ SOLN
INTRAMUSCULAR | Status: AC
  Filled 2017-02-19: qty 2

## 2017-02-19 MED ORDER — ACETAMINOPHEN 10 MG/ML IV SOLN
1000.0000 mg | Freq: Once | INTRAVENOUS | Status: AC
  Administered 2017-02-19: 1000 mg via INTRAVENOUS

## 2017-02-19 MED ORDER — ACETAMINOPHEN 10 MG/ML IV SOLN
INTRAVENOUS | Status: DC
Start: 2017-02-19 — End: 2017-02-19
  Filled 2017-02-19: qty 100

## 2017-02-19 MED ORDER — MIDAZOLAM HCL 2 MG/2ML IJ SOLN
INTRAMUSCULAR | Status: AC
  Filled 2017-02-19: qty 2

## 2017-02-19 MED ORDER — LACTATED RINGERS IV SOLN: INTRAVENOUS | Status: DC

## 2017-02-19 MED ORDER — SODIUM CHLORIDE 0.9 % IJ SOLN
INTRAMUSCULAR | Status: AC
  Filled 2017-02-19: qty 20

## 2017-02-19 MED ORDER — PROMETHAZINE HCL 25 MG/ML IJ SOLN
12.5000 mg | Freq: Once | INTRAMUSCULAR | Status: AC
  Administered 2017-02-19: 12.5 mg via INTRAVENOUS

## 2017-02-19 MED ORDER — ONDANSETRON HCL 4 MG/2ML IJ SOLN
INTRAMUSCULAR | Status: AC
  Filled 2017-02-19: qty 2

## 2017-02-19 MED ORDER — PROPOFOL 10 MG/ML IV BOLUS
INTRAVENOUS | Status: AC
  Filled 2017-02-19: qty 20

## 2017-02-19 MED ORDER — FENTANYL CITRATE (PF) 100 MCG/2ML IJ SOLN
25.0000 ug | INTRAMUSCULAR | Status: DC | PRN
  Administered 2017-02-19 (×3): 50 ug via INTRAVENOUS

## 2017-02-19 MED ORDER — DEXAMETHASONE SODIUM PHOSPHATE 10 MG/ML IJ SOLN
INTRAMUSCULAR | Status: AC
  Filled 2017-02-19: qty 1

## 2017-02-19 MED ORDER — OXYCODONE-ACETAMINOPHEN 5-325 MG PO TABS
ORAL_TABLET | ORAL | Status: AC
  Filled 2017-02-19: qty 1

## 2017-02-19 MED ORDER — DEXAMETHASONE SODIUM PHOSPHATE 10 MG/ML IJ SOLN
INTRAMUSCULAR | Status: DC | PRN
  Administered 2017-02-19: 5 mg via INTRAVENOUS

## 2017-02-19 MED ORDER — LIDOCAINE HCL (CARDIAC) 20 MG/ML IV SOLN
INTRAVENOUS | Status: DC | PRN
  Administered 2017-02-19: 60 mg via INTRAVENOUS

## 2017-02-19 MED ORDER — LACTATED RINGERS IV SOLN
INTRAVENOUS | Status: DC
  Administered 2017-02-19: 13:00:00 via INTRAVENOUS

## 2017-02-19 MED ORDER — OXYCODONE-ACETAMINOPHEN 5-325 MG PO TABS
1.0000 | ORAL_TABLET | ORAL | Status: DC | PRN
  Administered 2017-02-19: 1 via ORAL

## 2017-02-19 MED ORDER — KETOROLAC TROMETHAMINE 30 MG/ML IJ SOLN
INTRAMUSCULAR | Status: AC
Start: 2017-02-19 — End: 2017-02-19
  Filled 2017-02-19: qty 1

## 2017-02-19 MED ORDER — ONDANSETRON 4 MG PO TBDP: 4.0000 mg | ORAL_TABLET | Freq: Three times a day (TID) | ORAL | 0 refills | Status: DC | PRN

## 2017-02-19 MED ORDER — PROMETHAZINE HCL 25 MG/ML IJ SOLN
INTRAMUSCULAR | Status: AC
  Filled 2017-02-19: qty 1

## 2017-02-19 MED ORDER — ONDANSETRON HCL 4 MG/2ML IJ SOLN
INTRAMUSCULAR | Status: DC | PRN
  Administered 2017-02-19: 4 mg via INTRAVENOUS

## 2017-02-19 MED ORDER — FENTANYL CITRATE (PF) 100 MCG/2ML IJ SOLN
INTRAMUSCULAR | Status: DC | PRN
  Administered 2017-02-19 (×2): 50 ug via INTRAVENOUS

## 2017-02-19 MED ORDER — FAMOTIDINE 20 MG PO TABS
ORAL_TABLET | ORAL | Status: AC
  Administered 2017-02-19: 20 mg via ORAL
  Filled 2017-02-19: qty 1

## 2017-02-19 MED ORDER — OXYCODONE-ACETAMINOPHEN 5-325 MG PO TABS: 1.0000 | ORAL_TABLET | ORAL | 0 refills | Status: DC | PRN

## 2017-02-19 MED ORDER — LIDOCAINE HCL (PF) 2 % IJ SOLN
INTRAMUSCULAR | Status: AC
  Filled 2017-02-19: qty 2

## 2017-02-19 MED ORDER — FAMOTIDINE 20 MG PO TABS
20.0000 mg | ORAL_TABLET | Freq: Once | ORAL | Status: AC
  Administered 2017-02-19: 20 mg via ORAL

## 2017-02-19 MED ORDER — PROPOFOL 10 MG/ML IV BOLUS
INTRAVENOUS | Status: DC | PRN
  Administered 2017-02-19: 200 mg via INTRAVENOUS

## 2017-02-19 MED ORDER — MIDAZOLAM HCL 2 MG/2ML IJ SOLN
INTRAMUSCULAR | Status: DC | PRN
  Administered 2017-02-19: 2 mg via INTRAVENOUS

## 2017-02-19 SURGICAL SUPPLY — 19 items
CATH ROBINSON RED A/P 16FR (CATHETERS) ×2 IMPLANT
DRSG TELFA 3X8 NADH (GAUZE/BANDAGES/DRESSINGS) ×2 IMPLANT
GLOVE BIO SURGEON STRL SZ8 (GLOVE) ×4 IMPLANT
GOWN STRL REUS W/ TWL LRG LVL3 (GOWN DISPOSABLE) ×1 IMPLANT
GOWN STRL REUS W/ TWL XL LVL3 (GOWN DISPOSABLE) ×1 IMPLANT
GOWN STRL REUS W/TWL LRG LVL3 (GOWN DISPOSABLE) ×1
GOWN STRL REUS W/TWL XL LVL3 (GOWN DISPOSABLE) ×1
KIT BERKELEY 1ST TRIMESTER 3/8 (MISCELLANEOUS) ×2 IMPLANT
KIT RM TURNOVER CYSTO AR (KITS) ×2 IMPLANT
PACK DNC HYST (MISCELLANEOUS) ×2 IMPLANT
PAD OB MATERNITY 4.3X12.25 (PERSONAL CARE ITEMS) ×2 IMPLANT
PAD PREP 24X41 OB/GYN DISP (PERSONAL CARE ITEMS) ×2 IMPLANT
SET BERKELEY SUCTION TUBING (SUCTIONS) ×2 IMPLANT
SOL PREP PVP 2OZ (MISCELLANEOUS) ×2
SOLUTION PREP PVP 2OZ (MISCELLANEOUS) ×1 IMPLANT
SPONGE XRAY 4X4 16PLY STRL (MISCELLANEOUS) ×2 IMPLANT
TOWEL OR 17X26 4PK STRL BLUE (TOWEL DISPOSABLE) ×2 IMPLANT
VACURETTE 10 RIGID CVD (CANNULA) ×2 IMPLANT
VACURETTE 8 RIGID CVD (CANNULA) IMPLANT

## 2017-02-19 NOTE — OR Nursing (Signed)
Dr Randa Ngo notified that patient has a hx of marijuana use no uds ordered, no uds per md.

## 2017-02-19 NOTE — Anesthesia Procedure Notes (Signed)
Procedure Name: LMA Insertion Date/Time: 02/19/2017 1:39 PM Performed by: Karoline Caldwell Pre-anesthesia Checklist: Patient identified, Patient being monitored, Timeout performed, Emergency Drugs available and Suction available Patient Re-evaluated:Patient Re-evaluated prior to inductionOxygen Delivery Method: Circle system utilized Preoxygenation: Pre-oxygenation with 100% oxygen Intubation Type: IV induction Ventilation: Mask ventilation without difficulty LMA: LMA inserted LMA Size: 4.0 Tube type: Oral Number of attempts: 1 Placement Confirmation: positive ETCO2 and breath sounds checked- equal and bilateral Tube secured with: Tape Dental Injury: Teeth and Oropharynx as per pre-operative assessment

## 2017-02-19 NOTE — Interval H&P Note (Signed)
History and Physical Interval Note:  02/19/2017 1:13 PM  Abigail Harding  has presented today for surgery, with the diagnosis of MISSED ABORTION  The various methods of treatment have been discussed with the patient and family. After consideration of risks, benefits and other options for treatment, the patient has consented to  Procedure(s): DILATATION AND EVACUATION (N/A) as a surgical intervention .  The patient's history has been reviewed, patient examined, no change in status, stable for surgery.  I have reviewed the patient's chart and labs.  Questions were answered to the patient's satisfaction.     Daphine Deutscher A Genavive Kubicki

## 2017-02-19 NOTE — Anesthesia Preprocedure Evaluation (Signed)
Anesthesia Evaluation  Patient identified by MRN, date of birth, ID band Patient awake    Reviewed: Allergy & Precautions, H&P , NPO status , Patient's Chart, lab work & pertinent test results  History of Anesthesia Complications Negative for: history of anesthetic complications  Airway Mallampati: II  TM Distance: >3 FB Neck ROM: full    Dental  (+) Chipped   Pulmonary neg shortness of breath, asthma ,    Pulmonary exam normal breath sounds clear to auscultation       Cardiovascular Exercise Tolerance: Good (-) angina(-) Past MI and (-) DOE negative cardio ROS Normal cardiovascular exam Rhythm:regular Rate:Normal     Neuro/Psych  Headaches, PSYCHIATRIC DISORDERS    GI/Hepatic negative GI ROS, Neg liver ROS,   Endo/Other  negative endocrine ROS  Renal/GU      Musculoskeletal   Abdominal   Peds  Hematology negative hematology ROS (+)   Anesthesia Other Findings Past Medical History: No date: ADHD (attention deficit hyperactivity disorder) No date: Anemia No date: Anxiety No date: Asthma No date: Back pain No date: Endometriosis No date: Headache No date: History of fainting No date: History of kidney stones     Comment: In the past No date: Ovarian cyst No date: PID (pelvic inflammatory disease)  Past Surgical History: No date: LAPAROSCOPY 12/25/2016: LAPAROSCOPY N/A     Comment: Procedure: LAPAROSCOPY DIAGNOSTIC WITH               BIOPSIES;  Surgeon: Herold Harms, MD;                Location: [facility] ORS;  Service: Gynecology;                Laterality: N/A; No date: TONSILLECTOMY  BMI    Body Mass Index:  24.03 kg/m      Reproductive/Obstetrics                             Anesthesia Physical Anesthesia Plan  ASA: III  Anesthesia Plan: General LMA   Post-op Pain Management:    Induction:   Airway Management Planned:   Additional Equipment:   Intra-op  Plan:   Post-operative Plan:   Informed Consent: I have reviewed the patients History and Physical, chart, labs and discussed the procedure including the risks, benefits and alternatives for the proposed anesthesia with the patient or authorized representative who has indicated his/her understanding and acceptance.   Dental Advisory Given  Plan Discussed with: Anesthesiologist, CRNA and Surgeon  Anesthesia Plan Comments:         Anesthesia Quick Evaluation

## 2017-02-19 NOTE — H&P (View-Only) (Signed)
Subjective:  PREOPERATIVE HISTORY AND PHYSICAL   Date of surgery: 02/19/2017 Diagnosis: Missed abortion Procedure: Suction D&C   Patient is a 26 y.o. [id number] scheduled for suction D&C due to missed abortion. Serial ultrasound 21 week apart demonstrated no significant change with an approximate 7.4 week intrauterine gestational sac is seen without fetal pole. Quantitative hCG titer is over 55,000 area patient has not been experiencing any vaginal bleeding or pelvic cramping to date.  Ultrasound: (02/13/2017) ULTRASOUND REPORT  Location: [facility] Date of Service: 02/13/17  Indications:Threatened AB Findings:  Intrauterine pregnancy is visualized with a GSD consistent with 7 4/[redacted] weeks gestation, giving an (U/S) EDD of 09/28/17. There is no change in appearance from prior ultrasound performed on 02/05/17. The (U/S) EDD is NOT consistent with the clinically established (LMP) EDD of 08/11/17.  FHR: No fetal heart beat seen. CRL: There is no definite fetal pole seen.   Yolk sac is present..  Right Ovary measures 3 x 2 x 2.2 cm. It is normal in appearance. Left Ovary measures 3 x 1.6 x 2 cm. It is normal appearance. There is no evidence of a corpus luteal cyst. Survey of the adnexa demonstrates no adnexal masses. There is no free peritoneal fluid in the cul de sac.  Impression: 1. 7 4/7 week non Viable Intrauterine pregnancy by U/S measurement of gestational sac. 2. (U/S) EDD is not  consistent with Clinically established (LMP) EDD of 08/11/17. 3. No growth or change in gestational sac from prior ultrasound 1 week ago. Findings are consistent with Missed abortion  Recommendations: 1.Clinical correlation with the patient's History and Physical Exam.   Boyce Medici Herold Harms, MD   Pertinent Gynecological History: 10/25/16 ultrasound revealed 1.8 cm simple cyst otherwise nml. 11/17/15 laparoscopy for ruptured ovarian cyst w/o endometriosis  dx. 12/25/2016 laparoscopy demonstrated endometriosis, excised and fulgurated Menarche Age 67 with oligomenorrhea (les than 10 / year)  LMP: 11/04/16 Contraception: none. Stopped OCP d/t reading that "they may cause ovarian cysts." Last mammogram: n/a  Last pap: n/a  OB History    Gravida Para Term Preterm AB Living   0 0 0 0 0 0   SAB TAB Ectopic Multiple Live Births   0 0 0 0 0      No LMP recorded (lmp unknown).    Past Medical History:  Diagnosis Date  . ADHD (attention deficit hyperactivity disorder)   . Anemia   . Anxiety   . Asthma   . Back pain   . Endometriosis   . Headache   . History of kidney stones    In the past  . Ovarian cyst   . PID (pelvic inflammatory disease)     Past Surgical History:  Procedure Laterality Date  . LAPAROSCOPY    . LAPAROSCOPY N/A 12/25/2016   Procedure: LAPAROSCOPY DIAGNOSTIC WITH BIOPSIES;  Surgeon: Herold Harms, MD;  Location: [facility] ORS;  Service: Gynecology;  Laterality: N/A;  . TONSILLECTOMY      OB History  Gravida Para Term Preterm AB Living  0 0 0 0 0 0  SAB TAB Ectopic Multiple Live Births  0 0 0 0 0        Social History   Social History  . Marital status: Single    Spouse name: N/A  . Number of children: N/A  . Years of education: N/A   Social History Main Topics  . Smoking status: Never Smoker  . Smokeless tobacco: Former Neurosurgeon  . Alcohol use Yes  Comment: RARE  . Drug use: Yes    Types: Marijuana     Comment: DAILY- X 4 YEARS  . Sexual activity: Yes    Birth control/ protection: None   Other Topics Concern  . None   Social History Narrative  . None    Family History  Problem Relation Age of Onset  . Diabetes Mother   . Diabetes Maternal Grandmother   . Heart disease Maternal Grandmother     GRT ALSO  . Breast cancer Paternal Grandmother     GRT  . Prostate cancer Maternal Uncle   . Ovarian cancer Neg Hx   . Colon cancer Neg Hx      (Not in a hospital  admission)  Allergies  Allergen Reactions  . Tramadol Nausea And Vomiting and Other (See Comments)    cramping  . Hydrocodone Itching  . Ketorolac Nausea And Vomiting  . Morphine And Related Itching    Review of Systems Constitutional: No recent fever/chills/sweats Respiratory: No recent cough/bronchitis Cardiovascular: No chest pain Gastrointestinal: No recent nausea/vomiting/diarrhea Genitourinary: No UTI symptoms Hematologic/lymphatic:No history of coagulopathy or recent blood thinner use    Objective:    BP 105/68   Pulse 85   Ht  (1.626 m)   Wt 137 lb 14.4 oz (62.6 kg)   LMP  (LMP Unknown)   BMI 23.67 kg/m   General:   Normal  Skin:   normal  HEENT:  Normal  Neck:  Supple without Adenopathy or Thyromegaly  Lungs:   Heart:              Breasts:   Abdomen:  Pelvis:  M/S   Extremeties:  Neuro:    clear to auscultation bilaterally   Normal without murmur   Not Examined   soft, non-tender; bowel sounds normal; no masses,  no organomegaly   Exam deferred to OR  No CVAT  Warm/Dry   Normal         Assessment:    Missed abortion   Plan:  Suction D&C    Preop counseling: Patient is to undergo suction D&C for missed abortion on 02/19/2017. She is understanding of the planned procedure and is aware of and is accepting of all surgical risks which include but are not limited to bleeding, infection, pelvic organ injury with need for repair, blood clot disorders, anesthesia risks, etc. All questions have been answered. Informed consent is given. Patient is ready and willing to proceed with surgery as scheduled.  Herold Harms, MD  Note: This dictation was prepared with Dragon dictation along with smaller phrase technology. Any transcriptional errors that result from this process are unintentional.

## 2017-02-19 NOTE — Anesthesia Post-op Follow-up Note (Cosign Needed)
Anesthesia QCDR form completed.        

## 2017-02-19 NOTE — Anesthesia Postprocedure Evaluation (Signed)
Anesthesia Post Note  Patient: SEDRA MORFIN  Procedure(s) Performed: Procedure(s) (LRB): DILATATION AND EVACUATION (N/A)  Patient location during evaluation: PACU Anesthesia Type: General Level of consciousness: awake and alert Pain management: pain level controlled Vital Signs Assessment: post-procedure vital signs reviewed and stable Respiratory status: spontaneous breathing, nonlabored ventilation, respiratory function stable and patient connected to nasal cannula oxygen Cardiovascular status: blood pressure returned to baseline and stable Postop Assessment: no signs of nausea or vomiting Anesthetic complications: no     Last Vitals:  Vitals:   02/19/17 1504 02/19/17 1513  BP:    Pulse: 90 79  Resp: (!) 25 15  Temp:  36.6 C    Last Pain:  Vitals:   02/19/17 1513  TempSrc:   PainSc: 5                  Cleda Mccreedy Calandra Madura

## 2017-02-19 NOTE — Transfer of Care (Signed)
Immediate Anesthesia Transfer of Care Note  Patient: Abigail Harding  Procedure(s) Performed: Procedure(s): DILATATION AND EVACUATION (N/A)  Patient Location: PACU  Anesthesia Type:General  Level of Consciousness: awake  Airway & Oxygen Therapy: Patient Spontanous Breathing  Post-op Assessment: Report given to RN  Post vital signs: stable  Last Vitals:  Vitals:   02/19/17 1257 02/19/17 1415  BP: 121/63 122/87  Pulse: 96 90  Resp: 16 20  Temp: 36.7 C     Last Pain:  Vitals:   02/19/17 1415  TempSrc:   PainSc: 6          Complications: No apparent anesthesia complications

## 2017-02-19 NOTE — Op Note (Signed)
Abigail Harding PROCEDURE DATE: 02/19/2017 2:02 PM  PREOPERATIVE DIAGNOSIS: MISSED ABORTION POSTOPERATIVE DIAGNOSIS: MISSED ABORTION PROCEDURE: Procedure(s): DILATATION AND EVACUATION (N/A) SURGEON:  Dr. Daphine Deutscher A Dylynn Ketner ASSISTANT: None ANESTHESIA: General  INDICATIONS: 26 y.o. G1P0000 with history ofMissed AB and Anembryonic Gestation" . at 7.[redacted] weeks gestation presents for surgical management.   Please see preoperative notes for further details.   FINDINGS:  Uterus wasMidplane and 8 week size. Tissue consistent with products of conception was removed and sent to Pathology.   I/O's: Total I/O In: 500 [I.V.:500] Out: 125 [Urine:25; Blood:100] SPECIMENS: POC  COMPLICATIONS: None immediate COUNTS:  YES  PROCEDURE IN DETAIL: The patient was brought to the operating room where she was placed into the supine position.  General LMA anesthesia was induced without incident. She was placed in the dorsal lithotomy position using candy cane stirrups, and was examined; A ChloraPrep and Hibiclens prep and drape was performed in sterile fashion.   A  Red Robinson catheter was used to drain the bladder of urine. A weighted speculum was placed into  the vagina and a single tooth tenaculum was applied to the anterior lip of the cervix. The cervix was gently dilated using Hank's Dilators to accommodate a 10 is without organomegaly right mm suction curette, that was gently advanced to the uterine fundus.  The suction device was then activated and the curette was slowly rotated to clear the uterine cavity of products of conception.  A sharp curettage with a serrated curette  was then performed to confirm complete emptying of the uterus. Minimal bleeding was encountered. The tenaculum was removed along with all instruments  from the  vagina.   The patient was awakened, mobilized and taken to the recovery room in satisfactory condition.  The procedure was well-tolerated.  The patient will be discharged to home  as per PACU criteria.  Routine postoperative instructions were given along with a prescription for analgesics.  She will follow up in the clinic in 1-2 weeks for postoperative evaluation.  Herold Harms, MD [facility]

## 2017-02-20 ENCOUNTER — Encounter: Payer: Self-pay | Admitting: Obstetrics and Gynecology

## 2017-02-21 LAB — SURGICAL PATHOLOGY

## 2017-02-23 ENCOUNTER — Telehealth: Payer: Self-pay

## 2017-02-23 NOTE — Telephone Encounter (Signed)
S/p d&c 5 days ago. A few days ago bleeding stopped. Mild cramps. Yesterday am had really bad cramps and heavy bleeding started. Changing pad q 1 hour. Pt took 2 percocet did not help. Prior taking 1 with relief. Urinating and bm- ok. Eating some. Staying hydrated. NO fevers. No vomiting. Pos nausea. Pt aware to take ibup 800 q8. Percocet 2 q 6. Push fluids. If pain not relieved with meds or soaking a pad q 30 minutes to an hour she will need to go to the ER.

## 2017-02-27 ENCOUNTER — Encounter: Payer: Self-pay | Admitting: Obstetrics and Gynecology

## 2017-02-27 ENCOUNTER — Ambulatory Visit (INDEPENDENT_AMBULATORY_CARE_PROVIDER_SITE_OTHER): Payer: BLUE CROSS/BLUE SHIELD | Admitting: Obstetrics and Gynecology

## 2017-02-27 VITALS — BP 114/76 | HR 89 | Ht 64.0 in | Wt 140.4 lb

## 2017-02-27 DIAGNOSIS — O021 Missed abortion: Secondary | ICD-10-CM

## 2017-02-27 DIAGNOSIS — Z09 Encounter for follow-up examination after completed treatment for conditions other than malignant neoplasm: Secondary | ICD-10-CM

## 2017-02-27 DIAGNOSIS — R102 Pelvic and perineal pain: Secondary | ICD-10-CM

## 2017-02-27 DIAGNOSIS — N809 Endometriosis, unspecified: Secondary | ICD-10-CM

## 2017-02-27 NOTE — Progress Notes (Signed)
Chief complaint: 1. One week postop check 2. Status post suction D&C for missed AB  Patient is doing well since surgery. Minimal bleeding is encountered. Pain is well controlled.  PATHOLOGY: DIAGNOSIS:  A. PRODUCTS OF CONCEPTION; DILATION AND EVACUATION:  - DECIDUA AND CHORIONIC VILLI, CONSISTENT WITH PRODUCTS OF CONCEPTION.    OBJECTIVE: BP 114/76   Pulse 89   Ht  (1.626 m)   Wt 140 lb 6.4 oz (63.7 kg)   LMP 11/04/2016 (Approximate)   BMI 24.10 kg/m  Physical exam-deferred  ASSESSMENT: 1. Normal postop check 1 week status post suction D&C for missed AB 2. Pathology-decidua with chorionic villi 3. History of endometriosis  PLAN: 1. Resume activities as tolerated 2. Avoid conception for at least 1 normal cycle 3. Continue prenatal vitamins 4. Return in 3 months for follow-up on endometriosis  Herold Harms, MD  Note: This dictation was prepared with Dragon dictation along with smaller phrase technology. Any transcriptional errors that result from this process are unintentional.

## 2017-02-27 NOTE — Patient Instructions (Signed)
PLAN: 1. Resume activities as tolerated 2. Avoid conception for at least 1 normal cycle 3. Continue prenatal vitamins 4. Return in 3 months for follow-up on endometriosis

## 2017-05-29 ENCOUNTER — Ambulatory Visit (INDEPENDENT_AMBULATORY_CARE_PROVIDER_SITE_OTHER): Payer: BLUE CROSS/BLUE SHIELD | Admitting: Obstetrics and Gynecology

## 2017-05-29 ENCOUNTER — Encounter: Payer: Self-pay | Admitting: Obstetrics and Gynecology

## 2017-05-29 VITALS — BP 102/64 | HR 80 | Ht 64.0 in | Wt 132.5 lb

## 2017-05-29 DIAGNOSIS — N912 Amenorrhea, unspecified: Secondary | ICD-10-CM

## 2017-05-29 DIAGNOSIS — N809 Endometriosis, unspecified: Secondary | ICD-10-CM

## 2017-05-29 DIAGNOSIS — N907 Vulvar cyst: Secondary | ICD-10-CM

## 2017-05-29 DIAGNOSIS — N926 Irregular menstruation, unspecified: Secondary | ICD-10-CM | POA: Diagnosis not present

## 2017-05-29 LAB — POCT URINE PREGNANCY: Preg Test, Ur: NEGATIVE

## 2017-05-29 MED ORDER — MEDROXYPROGESTERONE ACETATE 10 MG PO TABS: 10.0000 mg | ORAL_TABLET | Freq: Every day | ORAL | 0 refills | Status: DC

## 2017-05-29 MED ORDER — CLOMIPHENE CITRATE 50 MG PO TABS: 50.0000 mg | ORAL_TABLET | Freq: Every day | ORAL | 1 refills | Status: DC

## 2017-05-29 NOTE — Progress Notes (Signed)
Chief complaint: 1. Endometriosis 2. Irregular menstrual cycles 3. History of miscarriage, status post D&C  Patient presents for 3 month follow-up after her miscarriage. She has had only 1 menstrual cycle June which was heavy and crampy. She is known to have irregular cycles with approximately 5 or 6 in 1 year. Patient is desiring to conceive. She is refilling her prenatal vitamins. She is willing to proceed with a trial of Clomid for ovulation induction  Patient is complaining of a lesion on her vulva which intermittently bleeds.  Past medical history: Past surgical history, problem list, medications, and allergies are reviewed  OBJECTIVE: BP 102/64   Pulse 80   Ht 5\' 4"  (1.626 m)   Wt 132 lb 8 oz (60.1 kg)   LMP 04/18/2017 (Approximate)   BMI 22.74 kg/m  Pleasant female in no acute distress Pelvic exam: External genitalia-left ilioinguinal region notable for a healed inclusion cyst; no nodules or epithelial skin breakdown is noted. Remaining assessment of the vulva is normal BUS-normal Vagina-not examined Bimanual-not performed  ASSESSMENT: 1. History of endometriosis, with recent dysmenorrhea and heavier menses 2. History of irregular menstrual cycles 3. Desires conception 4. Vulvar inclusion cyst, asymptomatic  PLAN: 1. UPT-negative 2. Provera withdrawal-10 mg a day for 10 days 3. Begin Clomid ovulation induction 50 mg a day days 5 through 9 4. Timed intercourse 5. Day 22 serum progesterone level is to be obtained 6. Return in 6 weeks for follow-up 7. Continue prenatal vitamins 8. Return if the vulvar inclusion cyst becomes symptomatic  A total of 15 minutes were spent face-to-face with the patient during this encounter and over half of that time dealt with counseling and coordination of care.   Herold HarmsMartin A Faigy Stretch, MD  Note: This dictation was prepared with Dragon dictation along with smaller phrase technology. Any transcriptional errors that result from this  process are unintentional.

## 2017-05-29 NOTE — Patient Instructions (Signed)
1.  UPT is negative today 2. Take Provera 10 mg a day for 10 days;Period Should start within 1 week after stopping medicine. 3. Began Clomid 50 mg a day on days 5 through 9 of cycle 4. Have timed intercourse  Around day 14 as described. 5. Return on day 22 of cycle for serum progesterone level 6.  Return in 6 weeks for follow-up

## 2017-05-30 ENCOUNTER — Telehealth: Payer: Self-pay | Admitting: Obstetrics and Gynecology

## 2017-05-30 NOTE — Telephone Encounter (Signed)
Patient called wanting to speak with Darol Destinerystal Miller to tell her that the patients job will discount her medication, They approved it and will put it through. Patient did not disclose any other information. Please advise.

## 2017-05-30 NOTE — Telephone Encounter (Signed)
Noted  

## 2017-06-14 ENCOUNTER — Other Ambulatory Visit: Payer: Self-pay | Admitting: Family

## 2017-06-14 ENCOUNTER — Encounter: Payer: Self-pay | Admitting: Family

## 2017-06-14 ENCOUNTER — Ambulatory Visit (INDEPENDENT_AMBULATORY_CARE_PROVIDER_SITE_OTHER): Payer: BLUE CROSS/BLUE SHIELD | Admitting: Family

## 2017-06-14 ENCOUNTER — Telehealth: Payer: Self-pay | Admitting: Obstetrics and Gynecology

## 2017-06-14 VITALS — BP 112/76 | HR 107 | Temp 98.4°F | Resp 16 | Wt 132.8 lb

## 2017-06-14 DIAGNOSIS — M25512 Pain in left shoulder: Secondary | ICD-10-CM

## 2017-06-14 LAB — POCT URINE PREGNANCY: Preg Test, Ur: POSITIVE — AB

## 2017-06-14 MED ORDER — PREDNISONE 10 MG PO TABS: ORAL_TABLET | ORAL | 0 refills | Status: DC

## 2017-06-14 MED ORDER — CYCLOBENZAPRINE HCL 5 MG PO TABS: 5.0000 mg | ORAL_TABLET | Freq: Two times a day (BID) | ORAL | 0 refills | Status: DC | PRN

## 2017-06-14 NOTE — Patient Instructions (Signed)
Muscle relaxant , primarily to use at bedtime.   Do not drive or operate heavy machinery while on muscle relaxant. Please do not drink alcohol. Only take this medication as needed for acute muscle spasm at bedtime. This medication make you feel drowsy so be very careful.  Stop taking if become too drowsy or somnolent as this puts you at risk for falls. Please contact our office with any questions.   Continue ibuprofen, heat  Start prednisone taper tomorrow MORNING  Gentle massage  If not better or new symptoms develop, please let us know.   If there is no improvement in your symptoms, or if there is any worsening of symptoms, or if you have any additional concerns, please return for re-evaluation; or, if we are closed, consider going to the Emergency Room for evaluation if symptoms urgent.

## 2017-06-14 NOTE — Progress Notes (Signed)
Subjective:    Patient ID: Abigail Harding, female    DOB: [birth year], 26 y.o.   MRN: [id number]  CC: [name] is a 26 y.o. female who presents today for an acute visit.    HPI: CC: left upper back pain x 2 days, worsening.  Squatted at work ( at [employer]) to reach item on  bottom shelf at work 2 days ago and felt sudden pain as 'soon as squatted down' in left shoulder blade.  Describes tingling feeling in left arm.  Intense pain. Feels better when carries arm in sling. Able to move arm however hurts in shoulder blade area. Pain today in middle back. No pain with meals.   Taken prior percoet with some relief. Heating pad not helping.    No fever, abdominal fever, dysuria, sob, chest pain, palpitations.   LMP 04/28/17; notes on medication to induce periods as they have been irregular.   On lamictal for 'black out and seizure.'  Not bipolar and h/o mania per patient.       HISTORY:  Past Medical History:  Diagnosis Date  . ADHD (attention deficit hyperactivity disorder)   . Anemia   . Anxiety   . Asthma   . Back pain   . Endometriosis   . Headache   . History of fainting   . History of kidney stones    In the past  . Ovarian cyst   . PID (pelvic inflammatory disease)    Past Surgical History:  Procedure Laterality Date  . DILATION AND EVACUATION N/A 02/19/2017   Procedure: DILATATION AND EVACUATION;  Surgeon: Herold Harms, MD;  Location: [facility];  Service: Gynecology;  Laterality: N/A;  . LAPAROSCOPY    . LAPAROSCOPY N/A 12/25/2016   Procedure: LAPAROSCOPY DIAGNOSTIC WITH BIOPSIES;  Surgeon: Herold Harms, MD;  Location: [facility];  Service: Gynecology;  Laterality: N/A;  . TONSILLECTOMY     Family History  Problem Relation Age of Onset  . Diabetes Mother   . Diabetes Maternal Grandmother   . Heart disease Maternal Grandmother        GRT ALSO  . Breast cancer Paternal Grandmother        GRT  . Prostate cancer Maternal Uncle   . Ovarian cancer  Neg Hx   . Colon cancer Neg Hx     Allergies: Hydrocodone; Ketorolac; Morphine and related; and Tramadol Current Outpatient Prescriptions on File Prior to Visit  Medication Sig Dispense Refill  . lamoTRIgine (LAMICTAL) 25 MG tablet Take 50 mg by mouth.    . medroxyPROGESTERone (PROVERA) 10 MG tablet Take 1 tablet (10 mg total) by mouth daily. 10 tablet 0  . Melatonin 5 MG TABS Take by mouth.    . ondansetron (ZOFRAN ODT) 4 MG disintegrating tablet Take 1 tablet (4 mg total) by mouth every 8 (eight) hours as needed for nausea or vomiting. 10 tablet 0  . Prenatal Vit-Fe Fumarate-FA (PRENATAL MULTIVITAMIN) TABS tablet Take 1 tablet by mouth daily at 12 noon.    . promethazine (PHENERGAN) 12.5 MG tablet Take 12.5 mg by mouth every 6 (six) hours as needed for nausea or vomiting.    . venlafaxine (EFFEXOR) 37.5 MG tablet Take 37.5 mg by mouth 2 (two) times daily.    Marland Kitchen venlafaxine XR (EFFEXOR-XR) 75 MG 24 hr capsule Take by mouth.    . clomiPHENE (CLOMID) 50 MG tablet Take 1 tablet (50 mg total) by mouth daily. Days 5 through 9 (Patient not taking: Reported  on 06/14/2017) 5 tablet 1   No current facility-administered medications on file prior to visit.     Social History  Substance Use Topics  . Smoking status: Never Smoker  . Smokeless tobacco: Former Neurosurgeon  . Alcohol use Yes     Comment: RARE    Review of Systems  Constitutional: Negative for chills and fever.  Respiratory: Negative for cough.   Cardiovascular: Negative for chest pain and palpitations.  Gastrointestinal: Negative for abdominal distention, abdominal pain, diarrhea, nausea and vomiting.  Genitourinary: Negative for dysuria.  Musculoskeletal: Positive for back pain. Negative for neck pain and neck stiffness.  Skin: Negative for rash.  Neurological: Negative for headaches.      Objective:    BP 112/76 (BP Location: Right Arm, Patient Position: Sitting, Cuff Size: Normal)   Pulse (!) 107   Temp 98.4 F (36.9 C) (Oral)    Resp 16   Wt 132 lb 12.8 oz (60.2 kg)   LMP 04/28/2017   SpO2 98%   BMI 22.80 kg/m    Physical Exam  Constitutional: She appears well-developed and well-nourished.  Eyes: Conjunctivae are normal.  Cardiovascular: Normal rate, regular rhythm, normal heart sounds and normal pulses.   Pulmonary/Chest: Effort normal and breath sounds normal. She has no wheezes. She has no rhonchi. She has no rales.  Abdominal: Soft. Normal appearance and bowel sounds are normal. She exhibits no distension, no fluid wave, no ascites and no mass. There is no tenderness. There is no rigidity, no rebound, no guarding and no CVA tenderness.  Musculoskeletal:       Left shoulder: She exhibits pain and spasm. She exhibits normal range of motion, no tenderness, no bony tenderness, no deformity, no laceration, normal pulse and normal strength.       Arms: left Shoulder:   Pain/tenderness noted left scapular area as noted on diagram.  No asymmetry of shoulders when comparing right and left.No pain with palpation over glenohumeral joint lines, [place] joint, AC joint, or bicipital groove. Pain at left scapula area with internal and external rotation and  with resisted lateral extension .   Negative active painful arc sign. Negative passive arc ( Neer's). Negative drop arm. No pain, swelling, or ecchymosis noted over long head of biceps. Bicep pain with resisted flexion and extension.  Negative speeds test. Negative Popeye's sign.   Strength and sensation normal BUE's.   Neurological: She is alert.  Skin: Skin is warm and dry. No rash noted.  Psychiatric: She has a normal mood and affect. Her speech is normal and behavior is normal. Thought content normal.  Vitals reviewed.      Assessment & Plan:   1. Acute pain of left shoulder Symptoms most consistent with musculoskeletal etiology. Reassured by normal strength, sensation. Pain appeared extra articular of shoulder and appears more over the trapezius of the left  upper back. Suspect spasm. Positive pregnancy hcg.  [facility]ed [facility] and spoke to pharm tech to cancel flexeril and prednisone. Awaiting serum hcg.    Return precautions given  - predniSONE (DELTASONE) 10 MG tablet; Take 40 mg by mouth on day 1, then taper 10 mg daily until gone  Dispense: 10 tablet; Refill: 0 - cyclobenzaprine (FLEXERIL) 5 MG tablet; Take 1 tablet (5 mg total) by mouth 2 (two) times daily as needed for muscle spasms.  Dispense: 14 tablet; Refill: 0 - POCT urine pregnancy    I am having Ms. Behnken maintain her prenatal multivitamin, ondansetron, promethazine, lamoTRIgine, Melatonin, venlafaxine XR, venlafaxine, medroxyPROGESTERone,  and clomiPHENE.   No orders of the defined types were placed in this encounter.   Return precautions given.   Risks, benefits, and alternatives of the medications and treatment plan prescribed today were discussed, and patient expressed understanding.   Education regarding symptom management and diagnosis given to patient on AVS.  Continue to follow with Dale , MD for routine health maintenance.   Tye Savoy and I agreed with plan.   Rennie Plowman, FNP

## 2017-06-14 NOTE — Telephone Encounter (Signed)
Patient is on a lot of medications and just found out that she is pregnant. She's concerned about how the medications may affect the baby and would like to see someone asap. LMP 04/28/2017 the UA here in July was negative but she is at her PCP today for her back and its positive   Please call

## 2017-06-15 ENCOUNTER — Telehealth: Payer: Self-pay | Admitting: Internal Medicine

## 2017-06-15 LAB — HCG, QUANTITATIVE, PREGNANCY: HCG, BETA CHAIN, QUANT, S: 2027.1 m[IU]/mL — AB

## 2017-06-15 LAB — HCG, SERUM, QUALITATIVE: PREG SERUM: POSITIVE — AB

## 2017-06-15 NOTE — Telephone Encounter (Signed)
Patient was informed of results.  Patient understood and no questions, comments, or concerns at this time.  

## 2017-06-15 NOTE — Telephone Encounter (Signed)
Pt called back requesting results. Please advise, thank you!  Call pt @ [phone]

## 2017-06-15 NOTE — Telephone Encounter (Signed)
Left message for patient to return call back.  

## 2017-06-15 NOTE — Telephone Encounter (Signed)
Malissa @ PCP office LVM that patient needs to be seen today - positive pregnancy test  If you need to speak with her call [phone]

## 2017-06-18 NOTE — Telephone Encounter (Signed)
Patient hasn't heard anything  Please call

## 2017-06-18 NOTE — Telephone Encounter (Signed)
lmtrc

## 2017-06-19 NOTE — Telephone Encounter (Signed)
Pt would like to be seen to asap. She was seen at pcp and pos beta. H/o of sab in 11/2016. Pt would like u/s. She also came off all anxiety and depression meds. She would like to go on safe meds. Appt made for Tuesday at 10:30 with mad. Offered to be seen by midwife but pt wanted to wait for mad. Encouraged pnv.

## 2017-06-19 NOTE — Telephone Encounter (Signed)
lmtrc

## 2017-06-26 ENCOUNTER — Encounter: Payer: Self-pay | Admitting: Obstetrics and Gynecology

## 2017-06-26 ENCOUNTER — Ambulatory Visit (INDEPENDENT_AMBULATORY_CARE_PROVIDER_SITE_OTHER): Payer: BLUE CROSS/BLUE SHIELD | Admitting: Obstetrics and Gynecology

## 2017-06-26 VITALS — BP 101/67 | HR 89 | Ht 64.0 in | Wt 138.8 lb

## 2017-06-26 DIAGNOSIS — Z8759 Personal history of other complications of pregnancy, childbirth and the puerperium: Secondary | ICD-10-CM | POA: Diagnosis not present

## 2017-06-26 DIAGNOSIS — Z3201 Encounter for pregnancy test, result positive: Secondary | ICD-10-CM | POA: Diagnosis not present

## 2017-06-26 DIAGNOSIS — O219 Vomiting of pregnancy, unspecified: Secondary | ICD-10-CM | POA: Insufficient documentation

## 2017-06-26 MED ORDER — DOXYLAMINE-PYRIDOXINE ER 20-20 MG PO TBCR: 20.0000 mg | EXTENDED_RELEASE_TABLET | ORAL | 1 refills | Status: DC

## 2017-06-26 MED ORDER — DOXYLAMINE-PYRIDOXINE 10-10 MG PO TBEC: 10.0000 mg | DELAYED_RELEASE_TABLET | Freq: Every day | ORAL | 1 refills | Status: DC

## 2017-06-26 NOTE — Patient Instructions (Signed)
1. Diclegis prescription is given  today for nausea and vomiting 2. Pelvic ultrasound is ordered-first available 3. Return in 3 weeks for new OB nursing intake 4. Return in 5 weeks for new OB history and physical 5. Discontinue Effexor XR and Lamictal medications

## 2017-06-26 NOTE — Progress Notes (Addendum)
GYN ENCOUNTER NOTE  Subjective:       Abigail Harding is a 26 y.o. G46P0010 female is here for gynecologic evaluation of the following issues:  1. Nausea and vomiting in pregnancy 2. Unsure last menstrual period 3. Chronic back pain 4. History of headaches and blackout spells  Pregnancy confirmation: LMP 04/28/2017 (unsure) EDD 02/02/2018 EGA 8.3 weeks  The patient took Provera for 10 days to induce a period after 04/28/2017. She did not have a withdrawal bleed. The patient never took Clomid for management of oligo ovulation.  Patient is having chronic nausea and vomiting and is interested in a Diclegis prescription for management. She is not experiencing any significant abnormal uterine bleeding. She has intermittent pelvic pain which tends to wax and wane. Low back pain is chronically present.    Patient has history of headaches and blackout spells. She was on Effexor XR and Lamictal. She has discontinued the medications at this time. She is not interested in remaining on the medications during pregnancy if at all possible.   Gynecologic History Patient's last menstrual period was 04/28/2017 (approximate). Contraception: none  Obstetric History OB History  Gravida Para Term Preterm AB Living  1 0 0 0 1 0  SAB TAB Ectopic Multiple Live Births  1 0 0 0 0    # Outcome Date GA Lbr Len/2nd Weight Sex Delivery Anes PTL Lv  1 SAB 2018              Past Medical History:  Diagnosis Date  . ADHD (attention deficit hyperactivity disorder)   . Anemia   . Anxiety   . Asthma   . Back pain   . Endometriosis   . Headache   . History of fainting   . History of kidney stones    In the past  . Ovarian cyst   . PID (pelvic inflammatory disease)     Past Surgical History:  Procedure Laterality Date  . DILATION AND EVACUATION N/A 02/19/2017   Procedure: DILATATION AND EVACUATION;  Surgeon: Herold Harms, MD;  Location: [facility];  Service: Gynecology;  Laterality: N/A;  .  LAPAROSCOPY    . LAPAROSCOPY N/A 12/25/2016   Procedure: LAPAROSCOPY DIAGNOSTIC WITH BIOPSIES;  Surgeon: Herold Harms, MD;  Location: [facility];  Service: Gynecology;  Laterality: N/A;  . TONSILLECTOMY      Current Outpatient Prescriptions on File Prior to Visit  Medication Sig Dispense Refill  . Prenatal Vit-Fe Fumarate-FA (PRENATAL MULTIVITAMIN) TABS tablet Take 1 tablet by mouth daily at 12 noon.     No current facility-administered medications on file prior to visit.     Allergies  Allergen Reactions  . Hydrocodone Itching  . Ketorolac Itching and Nausea And Vomiting  . Morphine And Related Itching  . Tramadol Nausea And Vomiting and Other (See Comments)    cramping    Social History   Social History  . Marital status: Single    Spouse name: N/A  . Number of children: N/A  . Years of education: N/A   Occupational History  . Not on file.   Social History Main Topics  . Smoking status: Never Smoker  . Smokeless tobacco: Former Neurosurgeon  . Alcohol use Yes     Comment: RARE  . Drug use: No     Comment: DAILY- X 4 YEARS  . Sexual activity: Yes    Birth control/ protection: None   Other Topics Concern  . Not on file   Social History Narrative  .  No narrative on file    Family History  Problem Relation Age of Onset  . Diabetes Mother   . Diabetes Maternal Grandmother   . Heart disease Maternal Grandmother        GRT ALSO  . Breast cancer Paternal Grandmother        GRT  . Prostate cancer Maternal Uncle   . Ovarian cancer Neg Hx   . Colon cancer Neg Hx     The following portions of the patient's history were reviewed and updated as appropriate: allergies, current medications, past family history, past medical history, past social history, past surgical history and problem list.  Review of Systems Review of Systems  Constitutional: Negative.   HENT: Negative.   Eyes: Negative.   Respiratory: Negative.   Cardiovascular: Negative.   Gastrointestinal:  Positive for abdominal pain, nausea and vomiting.  Genitourinary: Negative.   Musculoskeletal: Positive for back pain.  Skin: Negative.   Neurological: Negative.   Endo/Heme/Allergies: Negative.   Psychiatric/Behavioral: Negative.      Objective:   BP 101/67   Pulse 89   Ht 5\' 4"  (1.626 m)   Wt 138 lb 12.8 oz (63 kg)   LMP 04/28/2017 (Approximate)   BMI 23.82 kg/m  CONSTITUTIONAL: Well-developed, well-nourished female in no acute distress.  HENT:  Normocephalic, atraumatic.  NECK: Normal range of motion, supple, no masses.  Normal thyroid.  SKIN: Skin is warm and dry. No rash noted. Not diaphoretic. No erythema. No pallor. NEUROLGIC: Alert and oriented to person, place, and time. PSYCHIATRIC: Normal mood and affect. Normal behavior. Normal judgment and thought content. CARDIOVASCULAR:Not Examined RESPIRATORY: Not Examined BREASTS: Not Examined ABDOMEN: Soft, non distended; Non tender.  No Organomegaly. PELVIC:  External Genitalia: Normal  BUS: Normal  Vagina: Normal  Cervix: Normal; no lesions; no cervical motion tenderness  Uterus: Top normal size, shape,consistency, mobile, nontender  Adnexa: Normal; nonpalpable and nontender  RV: Normal external exam  Bladder: Nontender MUSCULOSKELETAL: Normal range of motion. No tenderness.  No cyanosis, clubbing, or edema.     Assessment:   1. Positive blood pregnancy test   2. Nausea and vomiting during pregnancy   3. History of miscarriage    Plan:   1. Pelvic ultrasound 2.Diclegis for nausea 3. Return to clinic 3 weeks for new OB nursing intake 4. Return to clinic 5 weeks for new OB history and physical  Herold HarmsMartin A Kayron Kalmar, MD  Note: This dictation was prepared with Dragon dictation along with smaller phrase technology. Any transcriptional errors that result from this process are unintentional.

## 2017-06-28 ENCOUNTER — Other Ambulatory Visit: Payer: Self-pay | Admitting: Obstetrics and Gynecology

## 2017-06-28 DIAGNOSIS — Z369 Encounter for antenatal screening, unspecified: Secondary | ICD-10-CM

## 2017-07-05 ENCOUNTER — Encounter: Payer: Self-pay | Admitting: Obstetrics and Gynecology

## 2017-07-09 ENCOUNTER — Ambulatory Visit (INDEPENDENT_AMBULATORY_CARE_PROVIDER_SITE_OTHER): Payer: BLUE CROSS/BLUE SHIELD

## 2017-07-09 DIAGNOSIS — Z369 Encounter for antenatal screening, unspecified: Secondary | ICD-10-CM

## 2017-07-10 ENCOUNTER — Encounter: Payer: BLUE CROSS/BLUE SHIELD | Admitting: Obstetrics and Gynecology

## 2017-07-17 ENCOUNTER — Encounter: Payer: Self-pay | Admitting: Obstetrics and Gynecology

## 2017-07-17 ENCOUNTER — Ambulatory Visit (INDEPENDENT_AMBULATORY_CARE_PROVIDER_SITE_OTHER): Payer: BLUE CROSS/BLUE SHIELD | Admitting: Obstetrics and Gynecology

## 2017-07-17 VITALS — BP 94/59 | HR 74 | Wt 142.3 lb

## 2017-07-17 DIAGNOSIS — Z1379 Encounter for other screening for genetic and chromosomal anomalies: Secondary | ICD-10-CM | POA: Diagnosis not present

## 2017-07-17 DIAGNOSIS — Z113 Encounter for screening for infections with a predominantly sexual mode of transmission: Secondary | ICD-10-CM

## 2017-07-17 DIAGNOSIS — Z3481 Encounter for supervision of other normal pregnancy, first trimester: Secondary | ICD-10-CM

## 2017-07-17 MED ORDER — DOXYLAMINE-PYRIDOXINE 10-10 MG PO TBEC: 10.0000 mg | DELAYED_RELEASE_TABLET | Freq: Every day | ORAL | 1 refills | Status: DC
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
## 2017-07-18 LAB — CBC WITH DIFFERENTIAL/PLATELET
BASOS ABS: 0 10*3/uL (ref 0.0–0.2)
BASOS: 0 %
EOS (ABSOLUTE): 0 10*3/uL (ref 0.0–0.4)
Eos: 0 %
Hematocrit: 42.2 % (ref 34.0–46.6)
Hemoglobin: 14.2 g/dL (ref 11.1–15.9)
IMMATURE GRANS (ABS): 0 10*3/uL (ref 0.0–0.1)
IMMATURE GRANULOCYTES: 0 %
LYMPHS: 16 %
Lymphocytes Absolute: 1.4 10*3/uL (ref 0.7–3.1)
MCH: 31.1 pg (ref 26.6–33.0)
MCHC: 33.6 g/dL (ref 31.5–35.7)
MCV: 92 fL (ref 79–97)
Monocytes Absolute: 0.7 10*3/uL (ref 0.1–0.9)
Monocytes: 8 %
NEUTROS PCT: 76 %
Neutrophils Absolute: 6.7 10*3/uL (ref 1.4–7.0)
PLATELETS: 231 10*3/uL (ref 150–379)
RBC: 4.57 x10E6/uL (ref 3.77–5.28)
RDW: 13.1 % (ref 12.3–15.4)
WBC: 8.9 10*3/uL (ref 3.4–10.8)

## 2017-07-18 LAB — GC/CHLAMYDIA PROBE AMP
CHLAMYDIA, DNA PROBE: NEGATIVE
NEISSERIA GONORRHOEAE BY PCR: NEGATIVE

## 2017-07-18 LAB — URINE DRUG PANEL 7
Amphetamines, Urine: NEGATIVE ng/mL
Barbiturate Quant, Ur: NEGATIVE ng/mL
Benzodiazepine Quant, Ur: NEGATIVE ng/mL
CANNABINOID QUANT UR: NEGATIVE ng/mL
COCAINE (METAB.): NEGATIVE ng/mL
OPIATE QUANT UR: NEGATIVE ng/mL
PCP QUANT UR: NEGATIVE ng/mL

## 2017-07-18 LAB — HIV ANTIBODY (ROUTINE TESTING W REFLEX): HIV Screen 4th Generation wRfx: NONREACTIVE

## 2017-07-18 LAB — RUBELLA SCREEN: RUBELLA: 6.24 {index} (ref >0.99)

## 2017-07-18 LAB — RPR: RPR: NONREACTIVE

## 2017-07-18 LAB — VARICELLA ZOSTER ANTIBODY, IGG: VARICELLA: 3306 {index} (ref >165)

## 2017-07-18 LAB — HEPATITIS B SURFACE ANTIGEN: HEP B S AG: NEGATIVE

## 2017-07-18 LAB — ABO AND RH: Rh Factor: POSITIVE

## 2017-07-19 ENCOUNTER — Telehealth: Payer: Self-pay | Admitting: Obstetrics and Gynecology

## 2017-07-19 LAB — CULTURE, OB URINE

## 2017-07-19 LAB — URINE CULTURE, OB REFLEX

## 2017-07-19 NOTE — Telephone Encounter (Signed)
Informed pt that provider has not reviewed labs therefore I cannot give her results. Advised that genetic results take 7-10 business days.

## 2017-07-19 NOTE — Telephone Encounter (Signed)
Patient called requesting lab results. She also stated she does not want to know what the sex of the baby is. Thanks

## 2017-07-21 LAB — CBC WITH DIFFERENTIAL/PLATELET

## 2017-07-21 LAB — HEPATITIS B SURFACE ANTIGEN

## 2017-07-21 LAB — MATERNIT21  PLUS CORE+ESS+SCA, BLOOD
Chromosome 13: NEGATIVE
Chromosome 18: NEGATIVE
Chromosome 21: NEGATIVE
Y Chromosome: NOT DETECTED

## 2017-07-21 LAB — ABO AND RH

## 2017-07-21 LAB — HIV ANTIBODY (ROUTINE TESTING W REFLEX)

## 2017-07-21 LAB — RUBELLA SCREEN

## 2017-07-21 LAB — VARICELLA ZOSTER ANTIBODY, IGG

## 2017-07-21 LAB — RPR

## 2017-07-23 ENCOUNTER — Telehealth: Payer: Self-pay

## 2017-07-23 NOTE — Telephone Encounter (Signed)
-----   Message from [name], MD sent at 07/21/2017  6:28 PM EDT ----- Please inform patient of normal MaterniT21 screen.  Results not released to Mychart as patient desires not to know gender (desires envelope instead), but encourage patient to also not check Mychart until after her gender reveal to ensure no accidental revelation of gender.

## 2017-07-23 NOTE — Telephone Encounter (Signed)
Called pt no answer, LM for pt to call back  

## 2017-07-23 NOTE — Telephone Encounter (Signed)
Called pt informed her of negative results and to pick up gender in envelope. Pt gave verbal understanding.

## 2017-07-30 ENCOUNTER — Telehealth: Payer: Self-pay | Admitting: Obstetrics and Gynecology

## 2017-07-30 NOTE — Telephone Encounter (Signed)
Called pt she states that she is having mild cramping (was worse before) and severe lower back pain. Pt denies heavy lifting or injury to the back. Pt does have history of UTI however states "this pain is not the same" Advised pt on the use of warm compresses, and Tylenol for relief. If no better in 24hr advised that pt needs to be seen. Pt gave verbal understanding.

## 2017-07-30 NOTE — Telephone Encounter (Signed)
Patient called and stated that she is having severe lower back pain, Patient can not stand up or move, Patient would like a call back as soon as possible. Please advise.

## 2017-07-30 NOTE — Telephone Encounter (Signed)
Cramping on the left side pretty bad - no bleeding and low back pain    Please call

## 2017-07-30 NOTE — Telephone Encounter (Signed)
See previous telephone encounter for documentation.

## 2017-07-30 NOTE — Telephone Encounter (Signed)
Please call patient @ work number - option 4 then 2 to go to her [phone]

## 2017-08-01 ENCOUNTER — Ambulatory Visit (INDEPENDENT_AMBULATORY_CARE_PROVIDER_SITE_OTHER): Payer: BLUE CROSS/BLUE SHIELD | Admitting: Obstetrics and Gynecology

## 2017-08-01 VITALS — BP 94/62 | HR 73 | Wt 142.5 lb

## 2017-08-01 DIAGNOSIS — O26899 Other specified pregnancy related conditions, unspecified trimester: Secondary | ICD-10-CM

## 2017-08-01 DIAGNOSIS — R109 Unspecified abdominal pain: Secondary | ICD-10-CM | POA: Diagnosis not present

## 2017-08-01 DIAGNOSIS — O9989 Other specified diseases and conditions complicating pregnancy, childbirth and the puerperium: Principal | ICD-10-CM

## 2017-08-01 DIAGNOSIS — O26891 Other specified pregnancy related conditions, first trimester: Secondary | ICD-10-CM | POA: Diagnosis not present

## 2017-08-01 DIAGNOSIS — M549 Dorsalgia, unspecified: Secondary | ICD-10-CM

## 2017-08-01 LAB — POCT URINALYSIS DIPSTICK
Bilirubin, UA: NEGATIVE
Blood, UA: NEGATIVE
Glucose, UA: NEGATIVE
Ketones, UA: NEGATIVE
Nitrite, UA: NEGATIVE
PH UA: 8.5 — AB (ref 5.0–8.0)
PROTEIN UA: NEGATIVE
Spec Grav, UA: <=1.005 — AB (ref 1.010–1.025)
UROBILINOGEN UA: 0.2 U/dL

## 2017-08-01 MED ORDER — CYCLOBENZAPRINE HCL 10 MG PO TABS: 10.0000 mg | ORAL_TABLET | Freq: Three times a day (TID) | ORAL | 1 refills | Status: DC | PRN

## 2017-08-01 NOTE — Progress Notes (Signed)
    GYNECOLOGY PROGRESS NOTE  Subjective:    Patient ID: Abigail Harding, female    DOB: [birth year], 26 y.o.   MRN: [id number]  HPI  Patient is a 26 y.o. G4P0010 female who presents for complaints of low back pain in pregnancy.  Patient is currently a G2P0010 at [redacted]w[redacted]d.  Notes excruciating back pain x 3 days, and mild abdominal cramping. States that she has taken Tylenol without relief. 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  Denies urinary symptoms, cramping or vaginal bleeding.  Patient notes that she was concerned as she has had a miscarriage recently and remembers having back pain and cramping associated.    The following portions of the patient's history were reviewed and updated as appropriate: allergies, current medications, past family history, past medical history, past social history, past surgical history and problem list.  Review of Systems Pertinent items noted in HPI and remainder of comprehensive ROS otherwise negative.   Objective:   Blood pressure 94/62, pulse 73, weight 142 lb 8 oz (64.6 kg), last menstrual period 04/28/2017. General appearance: alert and no distress Abdomen: soft, non-tender; bowel sounds normal; no masses,  no organomegaly.  FHT 154 bpm.  Back: symmetric, no curvature. ROM normal. No CVA tenderness., mild tenderness to palpation in lower lumbar region at spine and both sides bilaterally.   Pelvic: deferred.   Assessment:   Abdominal cramping in pregnancy Lower back pain in pregnancy   Plan:   - Discussed conservative measures for low back pain.  Advised on stretches, modified work activity, can take ES Tylenol prn, and use alternating heat/ice to back area.  Also given prescription for muscle relaxant to take at night to help with sleep if needed.  - Patient reassured after hearing fetal heart tones.  Discussed  signs/symptoms of a threatened miscarriage.  To return for evaluation if pain worsens or with onset of vaginal bleeding.  - Follow up in 1 week for NOB physical.     Hildred Laser, MD [facility]

## 2017-08-01 NOTE — Patient Instructions (Signed)
Back Pain in Pregnancy Back pain during pregnancy is common. Back pain may be caused by several factors that are related to changes during your pregnancy. Follow these instructions at home: Managing pain, stiffness, and swelling  If directed, apply ice for sudden (acute) back pain. ? Put ice in a plastic bag. ? Place a towel between your skin and the bag. ? Leave the ice on for 20 minutes, 2-3 times per day.  If directed, apply heat to the affected area before you exercise: ? Place a towel between your skin and the heat pack or heating pad. ? Leave the heat on for 20-30 minutes. ? Remove the heat if your skin turns bright red. This is especially important if you are unable to feel pain, heat, or cold. You may have a greater risk of getting burned. Activity  Exercise as told by your health care provider. Exercising is the best way to prevent or manage back pain.  Listen to your body when lifting. If lifting hurts, ask for help or bend your knees. This uses your leg muscles instead of your back muscles.  Squat down when picking up something from the floor. Do not bend over.  Only use bed rest as told by your health care provider. Bed rest should only be used for the most severe episodes of back pain. Standing, Sitting, and Lying Down  Do not stand in one place for long periods of time.  Use good posture when sitting. Make sure your head rests over your shoulders and is not hanging forward. Use a pillow on your lower back if necessary.  Try sleeping on your side, preferably the left side, with a pillow or two between your legs. If you are sore after a night's rest, your bed may be too soft. A firm mattress may provide more support for your back during pregnancy. General instructions  Do not wear high heels.  Eat a healthy diet. Try to gain weight within your health care provider's recommendations.  Use a maternity girdle, elastic sling, or back brace as told by your health care  provider.  Take over-the-counter and prescription medicines only as told by your health care provider.  Keep all follow-up visits as told by your health care provider. This is important. This includes any visits with any specialists, such as a physical therapist. Contact a health care provider if:  Your back pain interferes with your daily activities.  You have increasing pain in other parts of your body. Get help right away if:  You develop numbness, tingling, weakness, or problems with the use of your arms or legs.  You develop severe back pain that is not controlled with medicine.  You have a sudden change in bowel or bladder control.  You develop shortness of breath, dizziness, or you faint.  You develop nausea, vomiting, or sweating.  You have back pain that is a rhythmic, cramping pain similar to labor pains. Labor pain is usually 1-2 minutes apart, lasts for about 1 minute, and involves a bearing down feeling or pressure in your pelvis.  You have back pain and your water breaks or you have vaginal bleeding.  You have back pain or numbness that travels down your leg.  Your back pain developed after you fell.  You develop pain on one side of your back.  You see blood in your urine.  You develop skin blisters in the area of your back pain. This information is not intended to replace advice given to you   by your health care provider. Make sure you discuss any questions you have with your health care provider. Document Released: 02/07/2006 Document Revised: 04/06/2016 Document Reviewed: 07/14/2015 Elsevier Interactive Patient Education  2018 Elsevier Inc.  

## 2017-08-08 ENCOUNTER — Encounter: Payer: BLUE CROSS/BLUE SHIELD | Admitting: Obstetrics and Gynecology

## 2017-08-17 ENCOUNTER — Telehealth: Payer: Self-pay | Admitting: Obstetrics and Gynecology

## 2017-08-17 MED ORDER — FLUCONAZOLE 150 MG PO TABS: 150.0000 mg | ORAL_TABLET | Freq: Every day | ORAL | 0 refills | Status: DC

## 2017-08-17 NOTE — Telephone Encounter (Signed)
Patient called with questions regarding a script that is safe to take to treat thrush in pregnancy. Thanks

## 2017-08-17 NOTE — Telephone Encounter (Signed)
Pt has thrush. Was dx at [facility]. Per Ac ok to give diflucan. Med erx.

## 2017-08-30 ENCOUNTER — Telehealth: Payer: Self-pay

## 2017-08-30 NOTE — Telephone Encounter (Signed)
Call transferred from front desk-   Pt states she had lunch one hour ago. She has had 45oz of h20 since 6:55 am. Since eating lunch she feels lite headed, seeing spots, and hands are numb. NO vb or vd. Pos normal BM today. NO UTI sx. Cramps yesterday- mild. BP- 113/67. Advised pt to push fluids. Lie down and elevate legs above her heart. If sx are not relieved she will need to see ED or make an appt to be seen. Pt voices understanding.

## 2017-09-04 ENCOUNTER — Other Ambulatory Visit: Payer: Self-pay | Admitting: Certified Nurse Midwife

## 2017-09-05 ENCOUNTER — Ambulatory Visit (INDEPENDENT_AMBULATORY_CARE_PROVIDER_SITE_OTHER): Payer: BLUE CROSS/BLUE SHIELD | Admitting: Obstetrics and Gynecology

## 2017-09-05 ENCOUNTER — Other Ambulatory Visit: Payer: Self-pay | Admitting: Obstetrics and Gynecology

## 2017-09-05 VITALS — BP 104/69 | HR 73 | Wt 148.1 lb

## 2017-09-05 DIAGNOSIS — Z3482 Encounter for supervision of other normal pregnancy, second trimester: Secondary | ICD-10-CM

## 2017-09-05 LAB — POCT URINALYSIS DIPSTICK
Bilirubin, UA: NEGATIVE
Glucose, UA: NEGATIVE
Ketones, UA: NEGATIVE
Leukocytes, UA: NEGATIVE
NITRITE UA: NEGATIVE
PH UA: 8 (ref 5.0–8.0)
PROTEIN UA: NEGATIVE
RBC UA: NEGATIVE
UROBILINOGEN UA: 0.2 U/dL

## 2017-09-05 NOTE — Progress Notes (Signed)
ROB: No complaints.  Patient taking prenatal vitamins.  Desires AFP for spina bifida.  Ultrasound FAS at next visit.

## 2017-09-08 LAB — AFP, SERUM, OPEN SPINA BIFIDA
AFP MOM: 1.21
AFP VALUE AFPOSL: 45.6 ng/mL
Gest. Age on Collection Date: 17 weeks
MATERNAL AGE AT EDD: 24.7 a
OSBR RISK 1 IN: 6301
Test Results:: NEGATIVE
Weight: 148 [lb_av]

## 2017-09-11 ENCOUNTER — Telehealth: Payer: Self-pay | Admitting: Obstetrics and Gynecology

## 2017-09-11 NOTE — Telephone Encounter (Signed)
Patient called stating she is having severe lower back pain as well as hip pain. She states she feels like her hips are breaking. Please Advise. Thanks

## 2017-09-13 ENCOUNTER — Encounter: Payer: Self-pay | Admitting: Internal Medicine

## 2017-09-13 ENCOUNTER — Telehealth: Payer: Self-pay | Admitting: Obstetrics and Gynecology

## 2017-09-13 NOTE — Telephone Encounter (Signed)
Agree with ER evaluation since pain and emesis and [redacted] weeks pregnant.

## 2017-09-13 NOTE — Telephone Encounter (Signed)
Called patient she said her pain was on a scale of 9 out of 10 and that she cannot stop emesis, since [redacted] weeks pregnant, had advised calling OB/GYN, patient has called but they have not called back , patient crying on phone advised her not to wait to go and be evaluated now at either UC or ED. Patient stated she is going to ED.

## 2017-09-13 NOTE — Telephone Encounter (Signed)
Patient called stating she is having kidney pain and feels dehydrated. She states she cant keep anything down. Please Advise. Thanks.

## 2017-09-17 NOTE — Telephone Encounter (Signed)
Message left on pts voice mail- the mychart messages that were sent were not read so was checking up to see how pt was feeling.

## 2017-09-18 ENCOUNTER — Other Ambulatory Visit: Payer: Self-pay | Admitting: Obstetrics and Gynecology

## 2017-09-18 DIAGNOSIS — Z369 Encounter for antenatal screening, unspecified: Secondary | ICD-10-CM

## 2017-10-02 ENCOUNTER — Ambulatory Visit (INDEPENDENT_AMBULATORY_CARE_PROVIDER_SITE_OTHER): Payer: BLUE CROSS/BLUE SHIELD | Admitting: Obstetrics and Gynecology

## 2017-10-02 ENCOUNTER — Encounter: Payer: Self-pay | Admitting: Obstetrics and Gynecology

## 2017-10-02 ENCOUNTER — Ambulatory Visit (INDEPENDENT_AMBULATORY_CARE_PROVIDER_SITE_OTHER): Payer: BLUE CROSS/BLUE SHIELD

## 2017-10-02 VITALS — BP 97/66 | HR 76 | Wt 152.8 lb

## 2017-10-02 DIAGNOSIS — Z369 Encounter for antenatal screening, unspecified: Secondary | ICD-10-CM

## 2017-10-02 DIAGNOSIS — N644 Mastodynia: Secondary | ICD-10-CM

## 2017-10-02 DIAGNOSIS — Z3402 Encounter for supervision of normal first pregnancy, second trimester: Secondary | ICD-10-CM

## 2017-10-02 LAB — POCT URINALYSIS DIPSTICK
BILIRUBIN UA: NEGATIVE
GLUCOSE UA: NEGATIVE
Ketones, UA: NEGATIVE
NITRITE UA: NEGATIVE
PH UA: 7 (ref 5.0–8.0)
Protein, UA: NEGATIVE
Spec Grav, UA: 1.02 (ref 1.010–1.025)
Urobilinogen, UA: 0.2 E.U./dL

## 2017-10-02 NOTE — Progress Notes (Signed)
ROB: Patient c/o nipple irregularities (notes left nipple and areola more tender and prominent). Exam notes mild dimpling of areola and further protrusion of nipple compared with right, but no masses palpable and no nipple discharge. Both breast mildly tender. Will continue to monitor. S/p normal anatomy scan and AFP. RTC in 4 weeks.

## 2017-10-02 NOTE — Progress Notes (Signed)
ROB-Pt would like a breast exam, she states her nipples feel very tinder, and she states she a sharp pain this morning in her pelvic area when she sneezed

## 2017-10-09 ENCOUNTER — Telehealth: Payer: Self-pay | Admitting: Obstetrics and Gynecology

## 2017-10-09 NOTE — Telephone Encounter (Signed)
[facility] She stated she has been having consistent contractions all day. Just an [place]

## 2017-10-10 ENCOUNTER — Encounter: Payer: Self-pay | Admitting: Obstetrics and Gynecology

## 2017-10-10 ENCOUNTER — Telehealth: Payer: Self-pay | Admitting: Obstetrics and Gynecology

## 2017-10-10 NOTE — Telephone Encounter (Signed)
Patient called and stated that she is having pain and needs to see a provider Friday, I informed the patient that Evans and Valentino SaxonCherry will not be here Friday and Dr.De does not see OB patients. Please advise.

## 2017-10-12 ENCOUNTER — Ambulatory Visit (INDEPENDENT_AMBULATORY_CARE_PROVIDER_SITE_OTHER): Payer: BLUE CROSS/BLUE SHIELD | Admitting: Obstetrics and Gynecology

## 2017-10-12 ENCOUNTER — Encounter: Payer: Self-pay | Admitting: Obstetrics and Gynecology

## 2017-10-12 VITALS — BP 101/67 | HR 80 | Wt 156.6 lb

## 2017-10-12 DIAGNOSIS — R109 Unspecified abdominal pain: Secondary | ICD-10-CM | POA: Diagnosis not present

## 2017-10-12 DIAGNOSIS — O26899 Other specified pregnancy related conditions, unspecified trimester: Secondary | ICD-10-CM | POA: Diagnosis not present

## 2017-10-12 DIAGNOSIS — N949 Unspecified condition associated with female genital organs and menstrual cycle: Secondary | ICD-10-CM | POA: Diagnosis not present

## 2017-10-12 LAB — POCT URINALYSIS DIPSTICK
BILIRUBIN UA: NEGATIVE
Blood, UA: NEGATIVE
Glucose, UA: NEGATIVE
KETONES UA: NEGATIVE
Nitrite, UA: NEGATIVE
Protein, UA: NEGATIVE
Spec Grav, UA: <=1.005 — AB (ref 1.010–1.025)
Urobilinogen, UA: 0.2 E.U./dL
pH, Urine: 8.5

## 2017-10-12 MED ORDER — CITRANATAL B-CALM 20-1 MG & 2 X 25 MG PO MISC: 25.0000 mg | Freq: Every day | ORAL | 3 refills | Status: DC

## 2017-10-12 NOTE — Patient Instructions (Signed)
1.  Minimize activities which exacerbate round ligament pain 2.  Keep regular OB appointment as scheduled 3.  The abdominal/pelvic pain is nonacute and does not have any negative impact on the pregnancy at this time

## 2017-10-12 NOTE — Progress Notes (Signed)
Ob workin- lower abd pain- x 3 days.  OB PROBLEM VISIT: Patient presents for lower abdominal pain evaluation which began approximately 3 days ago.  She was seen in labor delivery for this pain and was not in labor per her history. Patient states that the pain is worse with movement and activity and prolonged working.  The pain improves with rest and lying down, although there is an exacerbation of the discomfort when she rests for too long. No UTI symptoms.  Good fetal movement is noted.  No vaginal bleeding.  No fevers chills or sweats.  No nausea vomiting or diarrhea.  Past medical history, past surgical history, problem list, medications, and allergies are reviewed  OBJECTIVE: BP 101/67   Pulse 80   Wt 156 lb 9.6 oz (71 kg)   LMP 04/28/2017 (Approximate)   BMI 26.88 kg/m  Pleasant well-appearing gravid female in no acute distress.  Alert and oriented. Back: No CVA tenderness or spinal tenderness Abdomen: Soft, nontender; uterus U +2 (22 cm); fetal heart tones 157; bilateral inguinal discomfort is noted with movement of the uterus. Pelvic exam: External genitalia-normal BUS-normal Bimanual-cervix is long, soft, closed, without cervical motion tenderness Rectovaginal-normal external exam  ASSESSMENT: 1.  22.2-week intrauterine pregnancy 2.  Lower abdominal pain consistent with round ligament pain  PLAN: 1.  Reassurance is given regarding round ligament pain 2.  Patient is to avoid excessive activities that make pain worse; she understands that the pain will be self-limited over time 3.  Return for regular OB appointment as scheduled in 3 weeks.  A total of 15 minutes were spent face-to-face with the patient during this encounter and over half of that time dealt with counseling and coordination of care.  Herold HarmsMartin A Defrancesco, MD  Note: This dictation was prepared with Dragon dictation along with smaller phrase technology. Any transcriptional errors that result from this process  are unintentional.

## 2017-10-12 NOTE — Addendum Note (Signed)
Addended by: Marchelle FolksMILLER, Kalai Baca G on: 10/12/2017 10:35 AM   Modules accepted: Orders

## 2017-10-14 LAB — URINE CULTURE: Organism ID, Bacteria: NO GROWTH

## 2017-10-15 ENCOUNTER — Other Ambulatory Visit: Payer: Self-pay

## 2017-10-15 MED ORDER — CONCEPT OB 130-92.4-1 MG PO CAPS: 1.0000 | ORAL_CAPSULE | Freq: Every day | ORAL | 11 refills | Status: DC

## 2017-10-30 ENCOUNTER — Encounter: Payer: BLUE CROSS/BLUE SHIELD | Admitting: Obstetrics and Gynecology

## 2017-10-30 ENCOUNTER — Encounter: Payer: Self-pay | Admitting: Obstetrics and Gynecology

## 2017-10-30 ENCOUNTER — Ambulatory Visit (INDEPENDENT_AMBULATORY_CARE_PROVIDER_SITE_OTHER): Payer: BLUE CROSS/BLUE SHIELD | Admitting: Obstetrics and Gynecology

## 2017-10-30 VITALS — BP 114/67 | HR 89 | Wt 163.4 lb

## 2017-10-30 DIAGNOSIS — B373 Candidiasis of vulva and vagina: Secondary | ICD-10-CM

## 2017-10-30 DIAGNOSIS — B3731 Acute candidiasis of vulva and vagina: Secondary | ICD-10-CM

## 2017-10-30 DIAGNOSIS — Z3482 Encounter for supervision of other normal pregnancy, second trimester: Secondary | ICD-10-CM

## 2017-10-30 MED ORDER — TERCONAZOLE 0.4 % VA CREA: 1.0000 | TOPICAL_CREAM | Freq: Every day | VAGINAL | 0 refills | Status: DC

## 2017-10-30 NOTE — Progress Notes (Signed)
ROB: Patient with complaint of intermittent back pain.  Specifically complains today about a swollen and "sloughing" labia bilaterally.  Patient states it is uncomfortable and burns.  Examination reveals mildly erythematous labia.  No sloughing noted.  Vaginal discharge appears consistent with monilia.  Terazol given.  One hour GCT next visit.

## 2017-11-01 ENCOUNTER — Telehealth: Payer: Self-pay

## 2017-11-01 NOTE — Telephone Encounter (Signed)
Mychart message sent.

## 2017-11-27 ENCOUNTER — Other Ambulatory Visit: Payer: BLUE CROSS/BLUE SHIELD

## 2017-11-27 ENCOUNTER — Encounter: Payer: BLUE CROSS/BLUE SHIELD | Admitting: Obstetrics and Gynecology

## 2017-12-31 ENCOUNTER — Ambulatory Visit: Payer: Self-pay

## 2017-12-31 NOTE — Telephone Encounter (Signed)
Called both of patients numbers listed multiple times. Unable to reach. Phone keeps giving busy disconnection message.

## 2017-12-31 NOTE — Telephone Encounter (Signed)
Pt calling with questionable thrush on tonugue. Pt describes a while film to tongue and "circles with a white border but not white in the middle of these circles. Pt describe the tingling on the tonue. Pt states that she has been vomiting for the past 45 minutes and has been having a "crampy" sensation. She stated  That it "feels like I need to go to the bathroom to have a bowel movement". She stated she has had decreased fetal movement this am. Denies any discharge or bloody discharge. Asked pt to call her OB GYN now and report symptoms. Advised pt it could be contractions and with the decreased fetal movement she  needed to report this information to her doctor now.  Answer Assessment - Initial Assessment Questions 1. ONSET: "When did the symptoms begin?"        This am  2. CONTRACTIONS: "Describe the contractions that you are having." (e.g., duration, frequency, regularity, severity)     Pt unsure if contractions- states she is having cramping and does not know if she is having contractions and wanted to know what contrctions felt like.  3. EDD: "What date are you expecting to deliver?"     Pt is [redacted] weeks gestation 4. PARITY: "Have you had a baby before?" If yes, "How long did the labor last?"     No - miscarriage  5. FETAL MOVEMENT: "Has the baby's movement decreased or changed significantly from normal?"     Baby's movement has decreased 6. OTHER SYMPTOMS: "Do you have any other symptoms?" (e.g., leaking fluid from vagina, fever, hand/facial swelling)      No discharge- Is vomiting  Answer Assessment - Initial Assessment Questions 1. SYMPTOM: "What's the main symptom you're concerned about?" (e.g., dry mouth. chapped lips, lump)     White film to back of tongue with "cicles" noted around edges and tip of tonge 2. ONSET: "When did the  ________  start?"      3. PAIN: "Is there any pain?" If so, ask: "How bad is it?" (Scale: 1-10; mild, moderate, severe)     tingling 4. CAUSE: "What do you  think is causing the symptoms?"     thrush 5. OTHER SYMPTOMS: "Do you have any other symptoms?" (e.g., fever, sore throat, toothache, swelling)     Just tingling denies pain 6. PREGNANCY: "Is there any chance you are pregnant?" "When was your last menstrual period?"     Yes [redacted] weeks pregnant  Answer Assessment - Initial Assessment Questions 1. VOMITING SEVERITY: "How many times have you vomited  in the past 24 hours?"     - MILD: 1 - 2 times/day    - MODERATE:  3 - 7 times/day    - SEVERE:  8 or more times/day, vomits everything or nearly everything, weight loss      Pt stated she has been vomiting for 45 minutes 2. ONSET: "When did the vomiting begin?"       45 minutes prior to call 3. FLUIDS: "What fluids or food have you vomited up today?" "Are you able to keep any liquids down?"      4. TREATMENT: "What have you been doing so far to treat this?"       5. DEHYDRATION: "When was the last time you urinated?" "Are you feeling lightheaded?" "Weight loss?"      6. PREGNANCY: "How many weeks pregnant are you?" "How has the pregnancy been going?"     34 weeks 7. EDD: "What date are  you expecting to deliver?"      8. MEDICATIONS: "What medications are you taking?" (e.g., prenatal vitamins, iron)      9. OTHER SYMPTOMS: "Do you have any other symptoms?"     ? Contractions versus cramping like she needs to have a BM, but bo BM, ? Oral thrush  Protocols used: PREGNANCY - LABOR - PRETERM-A-AH, MOUTH SYMPTOMS-A-AH, PREGNANCY - MORNING SICKNESS (NAUSEA AND VOMITING OF PREGNANCY)-A-AH

## 2017-12-31 NOTE — Telephone Encounter (Signed)
Agree.  Need to make sure she followed up with OB.

## 2017-12-31 NOTE — Telephone Encounter (Signed)
Patients mom confirmed she was doing ok. She stated she did call OBGYN but did not go in for appt.

## 2017-12-31 NOTE — Telephone Encounter (Signed)
FYI

## 2018-02-10 MED ORDER — PROCHLORPERAZINE EDISYLATE 5 MG/ML IJ SOLN
10.00 | INTRAMUSCULAR | Status: DC
Start: ? — End: 2018-02-10

## 2018-02-10 MED ORDER — DIPHENHYDRAMINE HCL 25 MG PO CAPS
25.00 | ORAL_CAPSULE | ORAL | Status: DC
Start: ? — End: 2018-02-10

## 2018-02-10 MED ORDER — HYDROCORTISONE 2.5 % RE CREA
1.00 | TOPICAL_CREAM | RECTAL | Status: DC
Start: ? — End: 2018-02-10

## 2018-02-10 MED ORDER — GENERIC EXTERNAL MEDICATION
15.00 | Status: DC
Start: ? — End: 2018-02-10

## 2018-02-10 MED ORDER — TETANUS-DIPHTH-ACELL PERTUSSIS 5-2.5-18.5 LF-MCG/0.5 IM SUSP
.50 | INTRAMUSCULAR | Status: DC
Start: ? — End: 2018-02-10

## 2018-02-10 MED ORDER — MEASLES, MUMPS & RUBELLA VAC ~~LOC~~ INJ
.50 | INJECTION | SUBCUTANEOUS | Status: DC
Start: ? — End: 2018-02-10

## 2018-02-10 MED ORDER — IBUPROFEN 800 MG PO TABS
800.00 | ORAL_TABLET | ORAL | Status: DC
Start: 2018-02-10 — End: 2018-02-10

## 2018-02-10 MED ORDER — OXYCODONE-ACETAMINOPHEN 5-325 MG PO TABS
1.00 | ORAL_TABLET | ORAL | Status: DC
Start: ? — End: 2018-02-10

## 2018-02-10 MED ORDER — WITCH HAZEL-GLYCERIN EX PADS
1.00 | MEDICATED_PAD | CUTANEOUS | Status: DC
Start: ? — End: 2018-02-10

## 2018-02-10 MED ORDER — ACETAMINOPHEN 325 MG PO TABS
650.00 | ORAL_TABLET | ORAL | Status: DC
Start: ? — End: 2018-02-10

## 2018-02-10 MED ORDER — ZOLPIDEM TARTRATE 5 MG PO TABS
5.00 | ORAL_TABLET | ORAL | Status: DC
Start: ? — End: 2018-02-10

## 2018-02-10 MED ORDER — SENNOSIDES-DOCUSATE SODIUM 8.6-50 MG PO TABS
1.00 | ORAL_TABLET | ORAL | Status: DC
Start: 2018-02-10 — End: 2018-02-10

## 2018-02-10 MED ORDER — BISACODYL 5 MG PO TBEC
10.00 | DELAYED_RELEASE_TABLET | ORAL | Status: DC
Start: ? — End: 2018-02-10

## 2018-02-10 MED ORDER — PROMETHAZINE HCL 25 MG PO TABS
25.00 | ORAL_TABLET | ORAL | Status: DC
Start: ? — End: 2018-02-10

## 2018-02-10 MED ORDER — BENZOCAINE-MENTHOL 20-0.5 % EX AERO
1.00 | INHALATION_SPRAY | CUTANEOUS | Status: DC
Start: ? — End: 2018-02-10

## 2018-04-10 MED ORDER — ONDANSETRON HCL 4 MG PO TABS
4.00 | ORAL_TABLET | ORAL | Status: DC
Start: ? — End: 2018-04-10

## 2018-04-10 MED ORDER — DICYCLOMINE HCL 10 MG PO CAPS
10.00 | ORAL_CAPSULE | ORAL | Status: DC
Start: ? — End: 2018-04-10

## 2018-04-10 MED ORDER — PRENATAL 19 PO TABS
1.00 | ORAL_TABLET | ORAL | Status: DC
Start: 2018-04-09 — End: 2018-04-10

## 2018-04-10 MED ORDER — DEXTROMETHORPHAN-GUAIFENESIN 10-100 MG/5ML PO SYRP
10.00 | ORAL_SOLUTION | ORAL | Status: DC
Start: ? — End: 2018-04-10

## 2018-04-10 MED ORDER — SALINE NASAL SPRAY 0.65 % NA SOLN
1.00 | NASAL | Status: DC
Start: ? — End: 2018-04-10

## 2018-04-10 MED ORDER — DOCUSATE SODIUM 100 MG PO CAPS
100.00 | ORAL_CAPSULE | ORAL | Status: DC
Start: 2018-04-08 — End: 2018-04-10

## 2018-04-10 MED ORDER — ACETAMINOPHEN 325 MG PO TABS
650.00 | ORAL_TABLET | ORAL | Status: DC
Start: ? — End: 2018-04-10

## 2018-04-10 MED ORDER — CYCLOBENZAPRINE HCL 10 MG PO TABS
5.00 | ORAL_TABLET | ORAL | Status: DC
Start: ? — End: 2018-04-10

## 2018-04-10 MED ORDER — IBUPROFEN 400 MG PO TABS
400.00 | ORAL_TABLET | ORAL | Status: DC
Start: ? — End: 2018-04-10

## 2018-04-10 MED ORDER — ENOXAPARIN SODIUM 40 MG/0.4ML ~~LOC~~ SOLN
40.00 | SUBCUTANEOUS | Status: DC
Start: 2018-04-09 — End: 2018-04-10

## 2018-04-12 ENCOUNTER — Encounter: Payer: Self-pay | Admitting: Internal Medicine

## 2018-04-12 DIAGNOSIS — Z0000 Encounter for general adult medical examination without abnormal findings: Secondary | ICD-10-CM | POA: Insufficient documentation

## 2018-05-08 ENCOUNTER — Encounter (HOSPITAL_COMMUNITY): Payer: Self-pay

## 2018-11-17 MED ORDER — SODIUM CHLORIDE FLUSH 0.9 % IV SOLN
10.00 | INTRAVENOUS | Status: DC
Start: ? — End: 2018-11-17

## 2018-11-17 MED ORDER — OXYCODONE-ACETAMINOPHEN 5-325 MG PO TABS
1.00 | ORAL_TABLET | ORAL | Status: DC
Start: ? — End: 2018-11-17

## 2018-11-17 MED ORDER — BENZOCAINE-MENTHOL 6-10 MG MT LOZG
1.00 | LOZENGE | OROMUCOSAL | Status: DC
Start: ? — End: 2018-11-17

## 2018-11-17 MED ORDER — ACETAMINOPHEN 325 MG PO TABS
650.00 | ORAL_TABLET | ORAL | Status: DC
Start: ? — End: 2018-11-17

## 2018-11-17 MED ORDER — VENLAFAXINE HCL ER 150 MG PO CP24
150.00 | ORAL_CAPSULE | ORAL | Status: DC
Start: 2018-11-18 — End: 2018-11-17

## 2018-11-17 MED ORDER — DIPHENHYDRAMINE HCL 50 MG/ML IJ SOLN
25.00 | INTRAMUSCULAR | Status: DC
Start: ? — End: 2018-11-17

## 2018-11-17 MED ORDER — BISACODYL 5 MG PO TBEC
10.00 | DELAYED_RELEASE_TABLET | ORAL | Status: DC
Start: 2018-11-18 — End: 2018-11-17

## 2018-11-17 MED ORDER — HYDROMORPHONE HCL 1 MG/ML IJ SOLN
0.50 | INTRAMUSCULAR | Status: DC
Start: ? — End: 2018-11-17

## 2018-11-17 MED ORDER — HEPARIN SODIUM (PORCINE) 5000 UNIT/ML IJ SOLN
5000.00 | INTRAMUSCULAR | Status: DC
Start: 2018-11-17 — End: 2018-11-17

## 2018-11-17 MED ORDER — ACETAMINOPHEN 650 MG RE SUPP
650.00 | RECTAL | Status: DC
Start: ? — End: 2018-11-17

## 2018-11-17 MED ORDER — ONDANSETRON HCL 4 MG/2ML IJ SOLN
4.00 | INTRAMUSCULAR | Status: DC
Start: ? — End: 2018-11-17

## 2018-11-17 MED ORDER — LORAZEPAM 0.5 MG PO TABS
0.50 | ORAL_TABLET | ORAL | Status: DC
Start: ? — End: 2018-11-17

## 2018-11-17 MED ORDER — GENERIC EXTERNAL MEDICATION
5.00 | Status: DC
Start: ? — End: 2018-11-17

## 2018-11-20 ENCOUNTER — Encounter (HOSPITAL_COMMUNITY): Payer: Self-pay | Admitting: Emergency Medicine

## 2018-11-20 ENCOUNTER — Emergency Department (HOSPITAL_COMMUNITY)
Admission: EM | Admit: 2018-11-20 | Discharge: 2018-11-20 | Disposition: A | Discharge status: Home / Self Care | Payer: BLUE CROSS/BLUE SHIELD | Attending: Emergency Medicine | Admitting: Emergency Medicine

## 2018-11-20 DIAGNOSIS — Z5321 Procedure and treatment not carried out due to patient leaving prior to being seen by health care provider: Secondary | ICD-10-CM | POA: Insufficient documentation

## 2018-11-20 DIAGNOSIS — G8918 Other acute postprocedural pain: Secondary | ICD-10-CM | POA: Insufficient documentation

## 2018-11-20 LAB — I-STAT BETA HCG BLOOD, ED (MC, WL, AP ONLY)

## 2018-11-20 LAB — COMPREHENSIVE METABOLIC PANEL
ALT: 21 U/L (ref 0–44)
AST: 30 U/L (ref 15–41)
Albumin: 4.1 g/dL (ref 3.5–5.0)
Alkaline Phosphatase: 49 U/L (ref 38–126)
Anion gap: 10 (ref 5–15)
BUN: 14 mg/dL (ref 6–20)
CO2: 21 mmol/L — ABNORMAL LOW (ref 22–32)
Calcium: 9 mg/dL (ref 8.9–10.3)
Chloride: 106 mmol/L (ref 98–111)
Creatinine, Ser: 0.85 mg/dL (ref 0.44–1.00)
GFR calc Af Amer: >60 mL/min (ref >60)
GFR calc non Af Amer: >60 mL/min (ref >60)
Glucose, Bld: 133 mg/dL — ABNORMAL HIGH (ref 70–99)
Potassium: 4.1 mmol/L (ref 3.5–5.1)
Sodium: 137 mmol/L (ref 135–145)
TOTAL PROTEIN: 7.7 g/dL (ref 6.5–8.1)
Total Bilirubin: 0.3 mg/dL (ref 0.3–1.2)

## 2018-11-20 LAB — CBC
HCT: 43.1 % (ref 36.0–46.0)
Hemoglobin: 14.3 g/dL (ref 12.0–15.0)
MCH: 30.2 pg (ref 26.0–34.0)
MCHC: 33.2 g/dL (ref 30.0–36.0)
MCV: 91.1 fL (ref 80.0–100.0)
Platelets: 248 10*3/uL (ref 150–400)
RBC: 4.73 MIL/uL (ref 3.87–5.11)
RDW: 12.1 % (ref 11.5–15.5)
WBC: 8.6 10*3/uL (ref 4.0–10.5)
nRBC: 0 % (ref 0.0–0.2)

## 2018-11-20 LAB — LIPASE, BLOOD: Lipase: 42 U/L (ref 11–51)

## 2018-11-20 NOTE — ED Triage Notes (Signed)
Pt reports that she has abd pains since her surgery for appendicitis over week ago. Pt reports vomiting started today.  Adds that she has 47mm kidney stone.  Took phenergan and oxycodone without relief that her mother had. Reports called MD yesterday and hasnt heard back from the office about medications.

## 2021-06-12 ENCOUNTER — Observation Stay (HOSPITAL_COMMUNITY): Payer: Medicaid Other

## 2021-06-12 ENCOUNTER — Inpatient Hospital Stay (HOSPITAL_COMMUNITY)
Admission: EM | Admit: 2021-06-12 | Discharge: 2021-06-15 | DRG: 312 | Disposition: A | Discharge status: Home / Self Care | Payer: Medicaid Other | Attending: Student in an Organized Health Care Education/Training Program | Admitting: Student in an Organized Health Care Education/Training Program

## 2021-06-12 ENCOUNTER — Emergency Department (HOSPITAL_COMMUNITY): Payer: Medicaid Other

## 2021-06-12 ENCOUNTER — Encounter (HOSPITAL_COMMUNITY): Payer: Self-pay | Admitting: Emergency Medicine

## 2021-06-12 DIAGNOSIS — R112 Nausea with vomiting, unspecified: Secondary | ICD-10-CM | POA: Diagnosis present

## 2021-06-12 DIAGNOSIS — Z833 Family history of diabetes mellitus: Secondary | ICD-10-CM

## 2021-06-12 DIAGNOSIS — Z820 Family history of epilepsy and other diseases of the nervous system: Secondary | ICD-10-CM

## 2021-06-12 DIAGNOSIS — Z9049 Acquired absence of other specified parts of digestive tract: Secondary | ICD-10-CM

## 2021-06-12 DIAGNOSIS — R102 Pelvic and perineal pain: Secondary | ICD-10-CM | POA: Diagnosis present

## 2021-06-12 DIAGNOSIS — Z20822 Contact with and (suspected) exposure to covid-19: Secondary | ICD-10-CM | POA: Diagnosis present

## 2021-06-12 DIAGNOSIS — G40409 Other generalized epilepsy and epileptic syndromes, not intractable, without status epilepticus: Secondary | ICD-10-CM | POA: Diagnosis present

## 2021-06-12 DIAGNOSIS — R0789 Other chest pain: Secondary | ICD-10-CM | POA: Diagnosis present

## 2021-06-12 DIAGNOSIS — Z793 Long term (current) use of hormonal contraceptives: Secondary | ICD-10-CM

## 2021-06-12 DIAGNOSIS — M545 Low back pain, unspecified: Secondary | ICD-10-CM | POA: Diagnosis present

## 2021-06-12 DIAGNOSIS — Z800 Family history of malignant neoplasm of digestive organs: Secondary | ICD-10-CM

## 2021-06-12 DIAGNOSIS — F909 Attention-deficit hyperactivity disorder, unspecified type: Secondary | ICD-10-CM | POA: Diagnosis present

## 2021-06-12 DIAGNOSIS — R569 Unspecified convulsions: Secondary | ICD-10-CM

## 2021-06-12 DIAGNOSIS — R55 Syncope and collapse: Secondary | ICD-10-CM | POA: Diagnosis not present

## 2021-06-12 DIAGNOSIS — G8929 Other chronic pain: Secondary | ICD-10-CM | POA: Diagnosis present

## 2021-06-12 DIAGNOSIS — F429 Obsessive-compulsive disorder, unspecified: Secondary | ICD-10-CM | POA: Diagnosis present

## 2021-06-12 DIAGNOSIS — Z8249 Family history of ischemic heart disease and other diseases of the circulatory system: Secondary | ICD-10-CM

## 2021-06-12 DIAGNOSIS — R1084 Generalized abdominal pain: Secondary | ICD-10-CM

## 2021-06-12 DIAGNOSIS — N941 Unspecified dyspareunia: Secondary | ICD-10-CM | POA: Diagnosis present

## 2021-06-12 DIAGNOSIS — Z87442 Personal history of urinary calculi: Secondary | ICD-10-CM

## 2021-06-12 DIAGNOSIS — R001 Bradycardia, unspecified: Secondary | ICD-10-CM | POA: Diagnosis present

## 2021-06-12 DIAGNOSIS — Z803 Family history of malignant neoplasm of breast: Secondary | ICD-10-CM

## 2021-06-12 DIAGNOSIS — G4700 Insomnia, unspecified: Secondary | ICD-10-CM | POA: Diagnosis present

## 2021-06-12 DIAGNOSIS — I471 Supraventricular tachycardia: Secondary | ICD-10-CM | POA: Diagnosis present

## 2021-06-12 DIAGNOSIS — R52 Pain, unspecified: Secondary | ICD-10-CM

## 2021-06-12 DIAGNOSIS — F411 Generalized anxiety disorder: Secondary | ICD-10-CM | POA: Diagnosis present

## 2021-06-12 DIAGNOSIS — N809 Endometriosis, unspecified: Secondary | ICD-10-CM | POA: Diagnosis present

## 2021-06-12 DIAGNOSIS — N898 Other specified noninflammatory disorders of vagina: Secondary | ICD-10-CM | POA: Diagnosis present

## 2021-06-12 LAB — CBC WITH DIFFERENTIAL/PLATELET
Abs Immature Granulocytes: 0.02 K/uL (ref 0.00–0.07)
Basophils Absolute: 0 K/uL (ref 0.0–0.1)
Basophils Relative: 0 %
Eosinophils Absolute: 0.1 K/uL (ref 0.0–0.5)
Eosinophils Relative: 1 %
HCT: 39.6 % (ref 36.0–46.0)
Hemoglobin: 13.4 g/dL (ref 12.0–15.0)
Immature Granulocytes: 0 %
Lymphocytes Relative: 20 %
Lymphs Abs: 1.4 K/uL (ref 0.7–4.0)
MCH: 30.9 pg (ref 26.0–34.0)
MCHC: 33.8 g/dL (ref 30.0–36.0)
MCV: 91.5 fL (ref 80.0–100.0)
Monocytes Absolute: 0.6 K/uL (ref 0.1–1.0)
Monocytes Relative: 9 %
Neutro Abs: 4.9 K/uL (ref 1.7–7.7)
Neutrophils Relative %: 70 %
Platelets: 181 K/uL (ref 150–400)
RBC: 4.33 MIL/uL (ref 3.87–5.11)
RDW: 12 % (ref 11.5–15.5)
WBC: 7 K/uL (ref 4.0–10.5)
nRBC: 0 % (ref 0.0–0.2)

## 2021-06-12 LAB — RESP PANEL BY RT-PCR (FLU A&B, COVID) ARPGX2
Influenza A by PCR: NEGATIVE
Influenza B by PCR: NEGATIVE
SARS Coronavirus 2 by RT PCR: NEGATIVE

## 2021-06-12 LAB — COMPREHENSIVE METABOLIC PANEL WITH GFR
ALT: 10 U/L (ref 0–44)
AST: 18 U/L (ref 15–41)
Albumin: 4 g/dL (ref 3.5–5.0)
Alkaline Phosphatase: 34 U/L — ABNORMAL LOW (ref 38–126)
Anion gap: 7 (ref 5–15)
BUN: 10 mg/dL (ref 6–20)
CO2: 24 mmol/L (ref 22–32)
Calcium: 8.7 mg/dL — ABNORMAL LOW (ref 8.9–10.3)
Chloride: 108 mmol/L (ref 98–111)
Creatinine, Ser: 0.95 mg/dL (ref 0.44–1.00)
GFR, Estimated: >60 mL/min
Glucose, Bld: 110 mg/dL — ABNORMAL HIGH (ref 70–99)
Potassium: 3.8 mmol/L (ref 3.5–5.1)
Sodium: 139 mmol/L (ref 135–145)
Total Bilirubin: 0.8 mg/dL (ref 0.3–1.2)
Total Protein: 6.5 g/dL (ref 6.5–8.1)

## 2021-06-12 LAB — I-STAT BETA HCG BLOOD, ED (MC, WL, AP ONLY): I-stat hCG, quantitative: <5 m[IU]/mL

## 2021-06-12 LAB — WET PREP, GENITAL
Clue Cells Wet Prep HPF POC: NONE SEEN
Sperm: NONE SEEN
Trich, Wet Prep: NONE SEEN
Yeast Wet Prep HPF POC: NONE SEEN

## 2021-06-12 LAB — ECHOCARDIOGRAM COMPLETE
Area-P 1/2: 3.42 cm2
S' Lateral: 2.9 cm

## 2021-06-12 LAB — LIPASE, BLOOD: Lipase: 33 U/L (ref 11–51)

## 2021-06-12 IMAGING — CT CT ANGIO CHEST
2 of 6 series · 9 of 36 positions shown · IV contrast (omnipaque)
Comparison: [DATE]

CLINICAL DATA: Two syncopal episodes, acute abdominal pain with
nausea and diarrhea, recent fall

EXAM:
CT ANGIOGRAPHY CHEST
CT ABDOMEN AND PELVIS WITH CONTRAST
TECHNIQUE: Multidetector CT imaging of the chest was performed using the
standard protocol during bolus administration of intravenous
contrast. Multiplanar CT image reconstructions and MIPs were
obtained to evaluate the vascular anatomy. Multidetector CT imaging
of the abdomen and pelvis was performed using the standard protocol
during bolus administration of intravenous contrast.
CONTRAST:  100mL OMNIPAQUE IOHEXOL 350 MG/ML SOLN

[Series 5: pe 2mm · axial · 0.59mm/px · z∈[-549,-339]mm · 8 of 136 slices shown]
[im 16/136  lung]
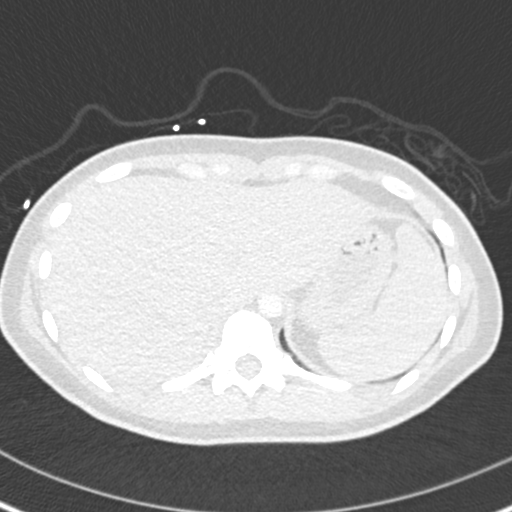
[im 31/136  mediastinal]
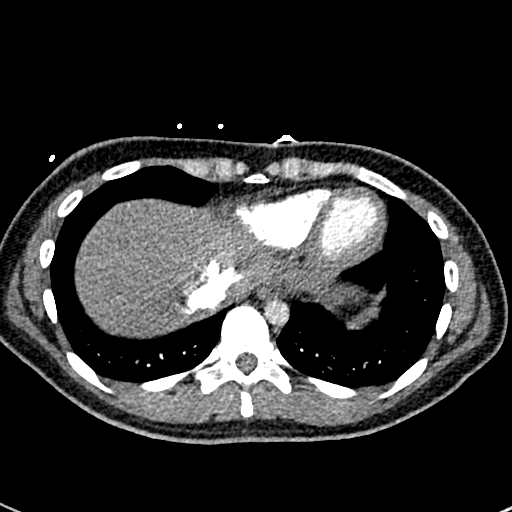
[im 46/136  lung]
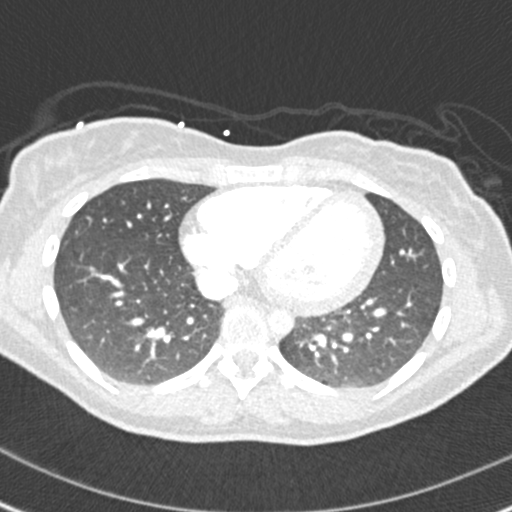
[im 61/136  mediastinal]
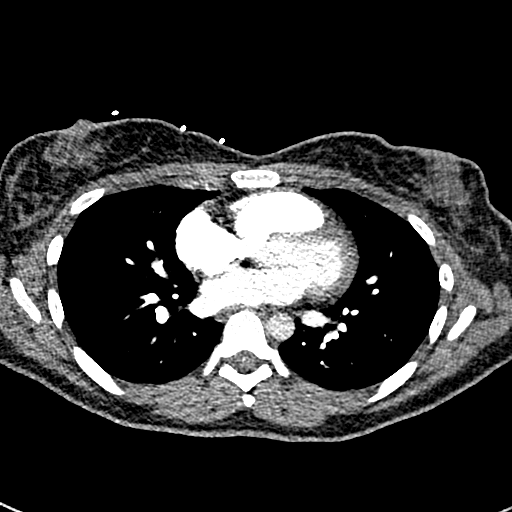
[im 76/136  lung]
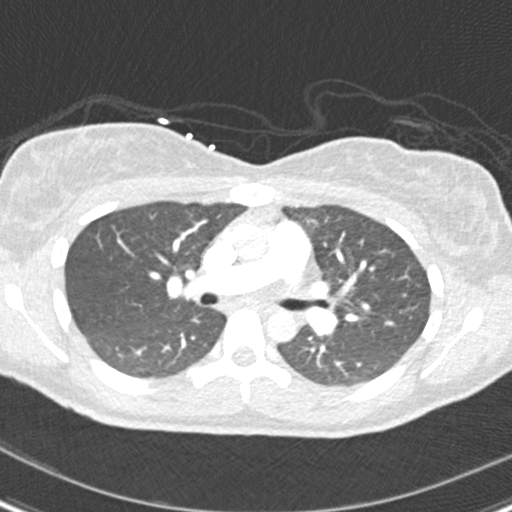
[im 91/136  mediastinal]
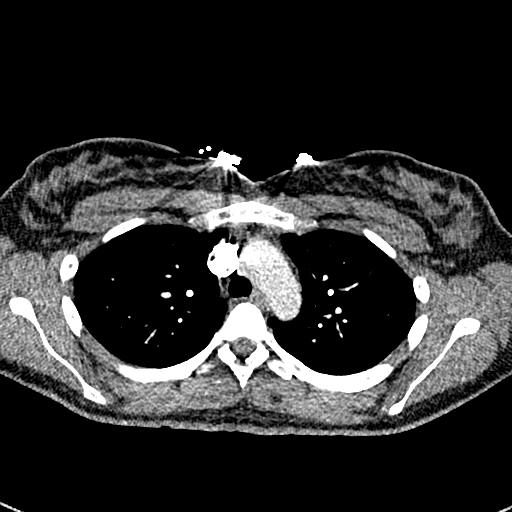
[im 106/136  lung]
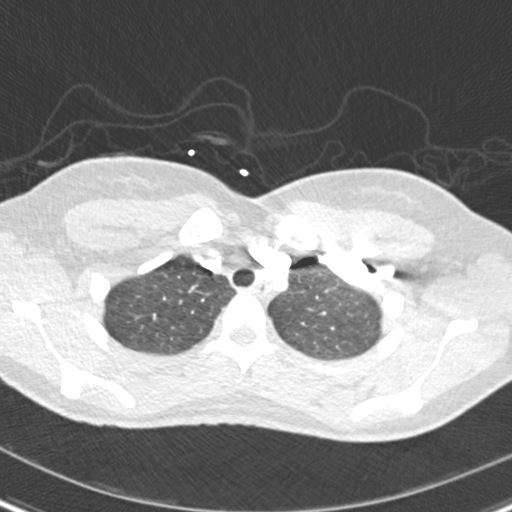
[im 121/136  mediastinal]
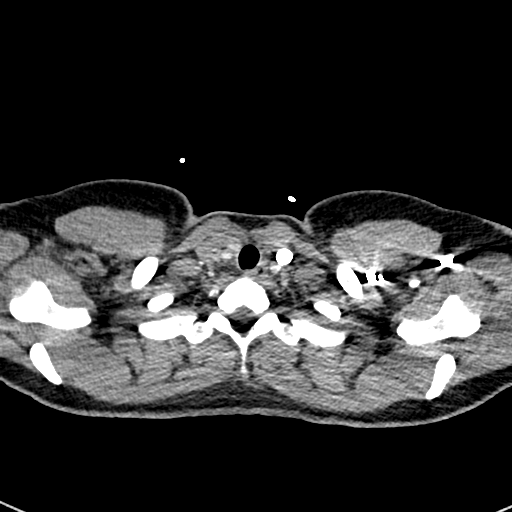

[Series 8: pe 2mm cor · coronal · 0.59mm/px · 1 of 94 slices shown]
[im 47/94  mediastinal]
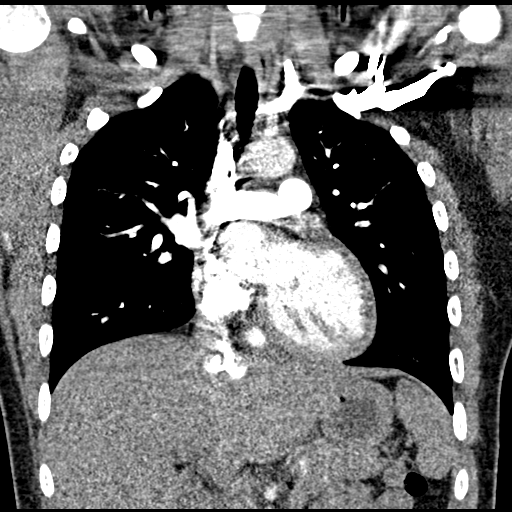

[9 of 36 positions shown; findings below may reference images not displayed]

FINDINGS: CTA CHEST FINDINGS

Cardiovascular: Pulmonary arteries are normal in caliber and patent.
Negative for significant pulmonary embolus or filling defect by CTA.

Thoracic aorta intact. No aneurysm or dissection. Negative for
mediastinal hemorrhage or hematoma. Normal heart size. No
pericardial effusion.

Central venous structures are patent.  No LALANDE process.

Mediastinum/Nodes: No enlarged mediastinal, hilar, or axillary lymph
nodes. Thyroid gland, trachea, and esophagus demonstrate no
significant findings.

Lungs/Pleura: Lungs are clear. No pleural effusion or pneumothorax.

Musculoskeletal: No chest wall abnormality. No acute or significant
osseous findings.

Review of the MIP images confirms the above findings.

CT ABDOMEN and PELVIS FINDINGS

Hepatobiliary: Focal fatty infiltration of liver along falciform
ligament. No other significant abnormality. Common bile nondilated.
Gallbladder nondistended.

Pancreas: Unremarkable. No pancreatic ductal dilatation or
surrounding inflammatory changes.

Spleen: Normal in size without focal abnormality.

Adrenals/Urinary Tract: Adrenal glands are unremarkable. Kidneys are
normal, without renal calculi, focal lesion, or hydronephrosis.
Bladder is unremarkable.

Stomach/Bowel: negative for bowel obstruction, significant
dilatation, ileus, or free air. No free fluid, fluid collection,
hemorrhage, hematoma, abscess or ascites. Appendix not visualized
with certainty. No acute inflammatory process.

Vascular/Lymphatic: No significant vascular findings are present. No
enlarged abdominal or pelvic lymph nodes.

Reproductive: Uterus and bilateral adnexa are unremarkable.

Other: No abdominal wall hernia or abnormality. No abdominopelvic
ascites.

Musculoskeletal: No acute or significant osseous findings.

Review of the MIP images confirms the above findings.
IMPRESSION: Negative for significant acute pulmonary embolus by CTA.

No other acute intrathoracic finding.

No acute intra-abdominal or pelvic finding.

## 2021-06-12 IMAGING — CT CT ABD-PELV W/ CM
2 of 4 series · 15 of 46 positions shown, 17 images · IV contrast (omnipaque)
Comparison: [DATE]

CLINICAL DATA: Two syncopal episodes, acute abdominal pain with
nausea and diarrhea, recent fall

EXAM:
CT ANGIOGRAPHY CHEST
CT ABDOMEN AND PELVIS WITH CONTRAST
TECHNIQUE: Multidetector CT imaging of the chest was performed using the
standard protocol during bolus administration of intravenous
contrast. Multiplanar CT image reconstructions and MIPs were
obtained to evaluate the vascular anatomy. Multidetector CT imaging
of the abdomen and pelvis was performed using the standard protocol
during bolus administration of intravenous contrast.
CONTRAST:  100mL OMNIPAQUE IOHEXOL 350 MG/ML SOLN

[Series 3: a/p w/ 5mm · axial · 0.73mm/px · z∈[-928,-518]mm · 12 of 98 slices shown, 14 images]
[im 8/98  soft-tissue]
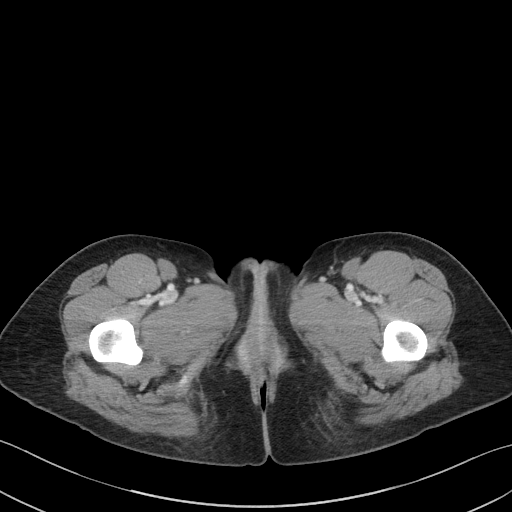
[im 8/98  bone]
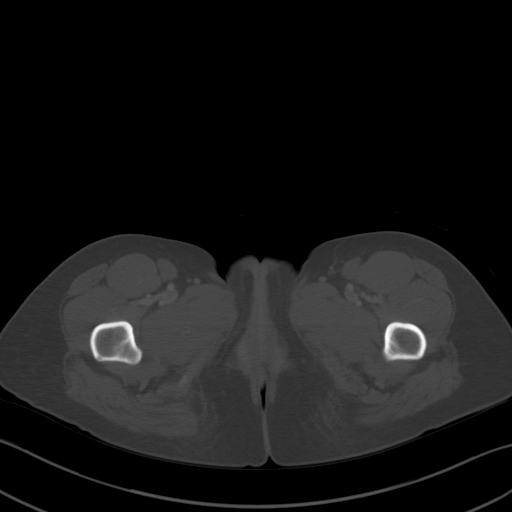
[im 15/98  soft-tissue]
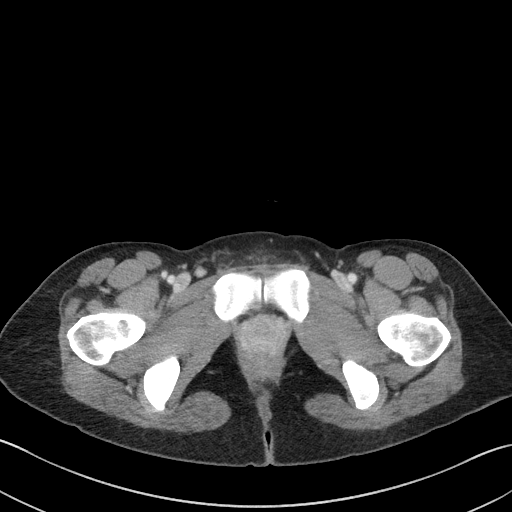
[im 23/98  soft-tissue]
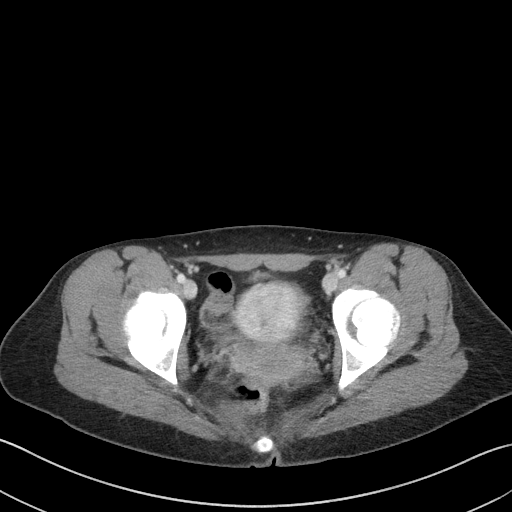
[im 30/98  soft-tissue]
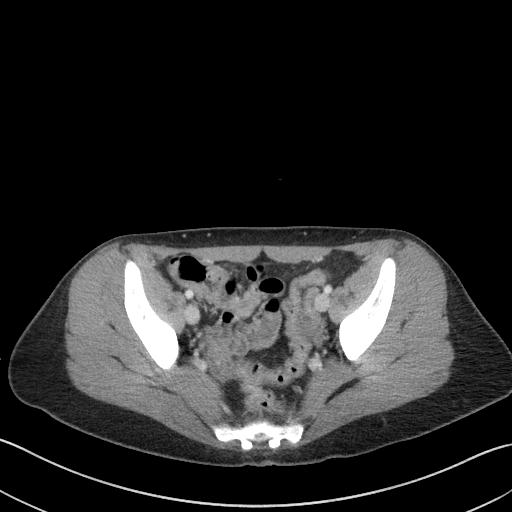
[im 38/98  soft-tissue]
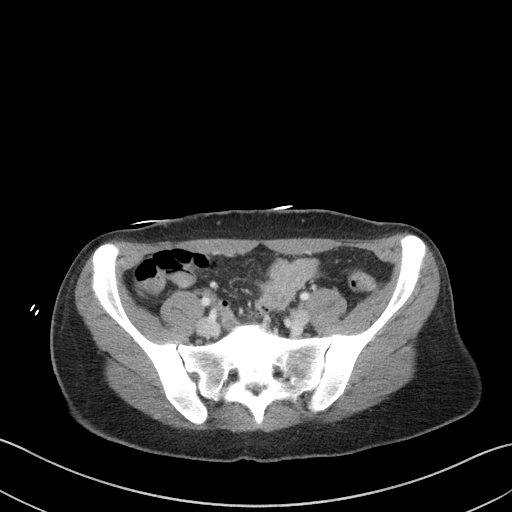
[im 45/98  soft-tissue]
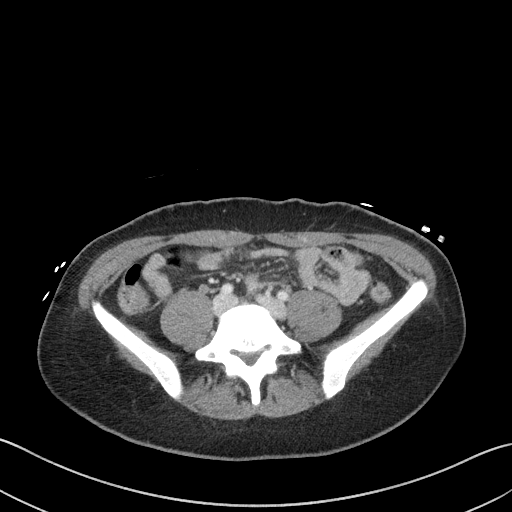
[im 53/98  soft-tissue]
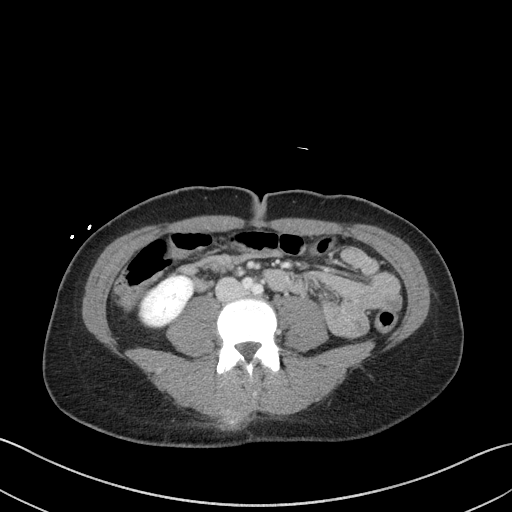
[im 60/98  soft-tissue]
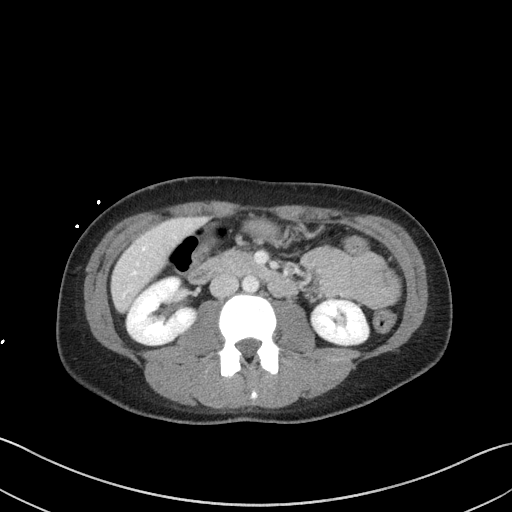
[im 68/98  soft-tissue]
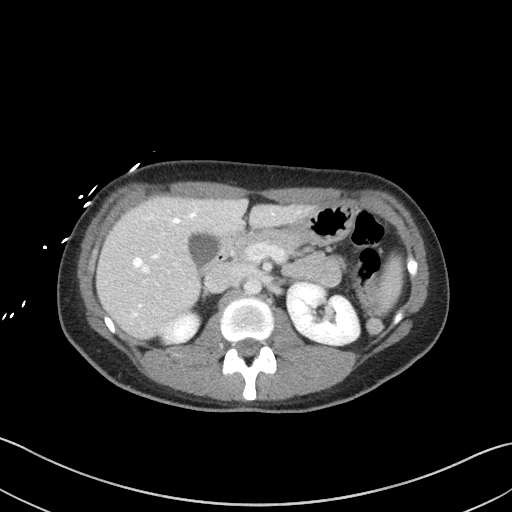
[im 68/98  bone]
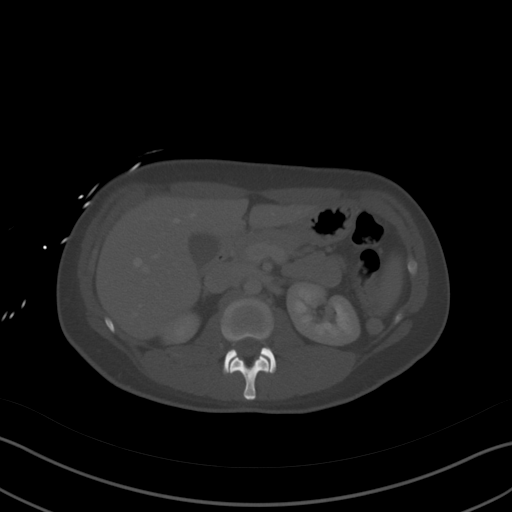
[im 75/98  soft-tissue]
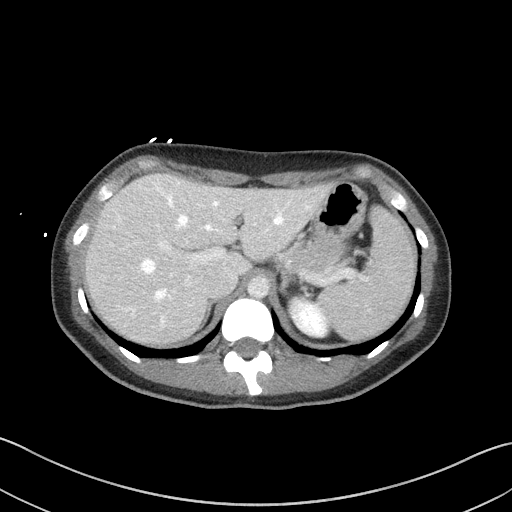
[im 83/98  soft-tissue]
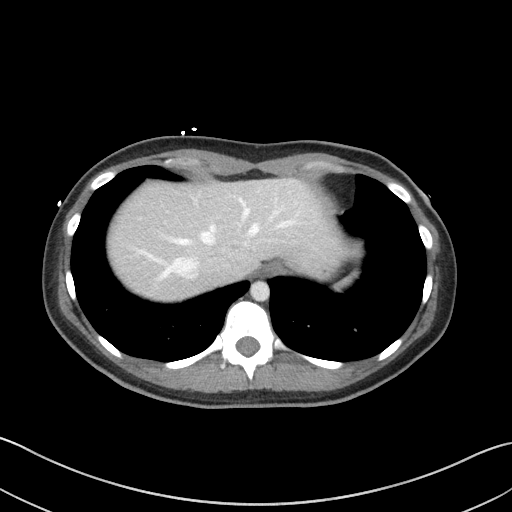
[im 90/98  soft-tissue]
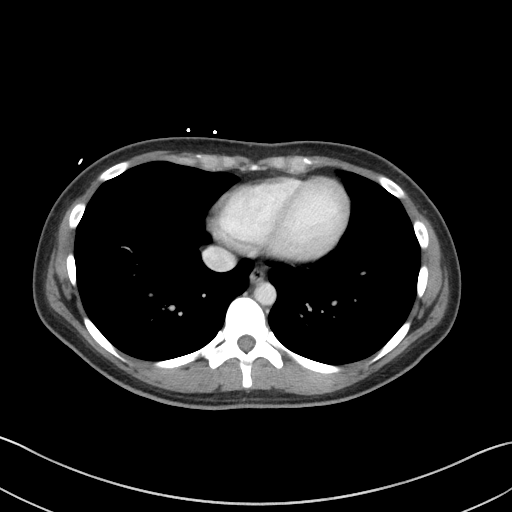

[Series 6: a/p w/ cor · coronal · 0.71mm/px · 3 of 111 slices shown]
[im 37/111  soft-tissue]
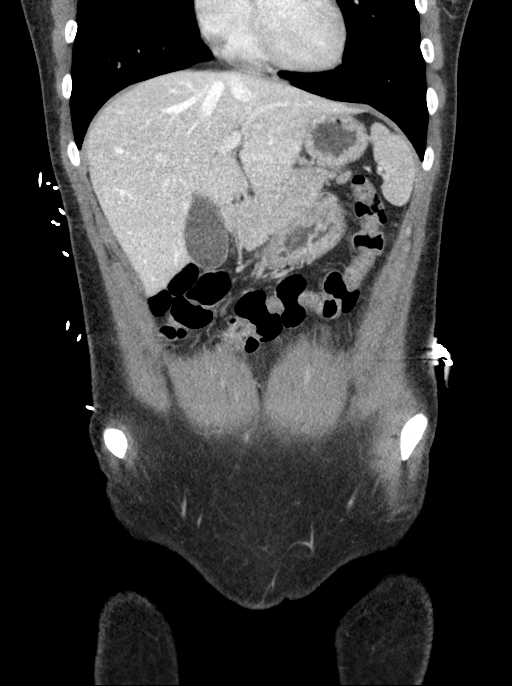
[im 49/111  soft-tissue]
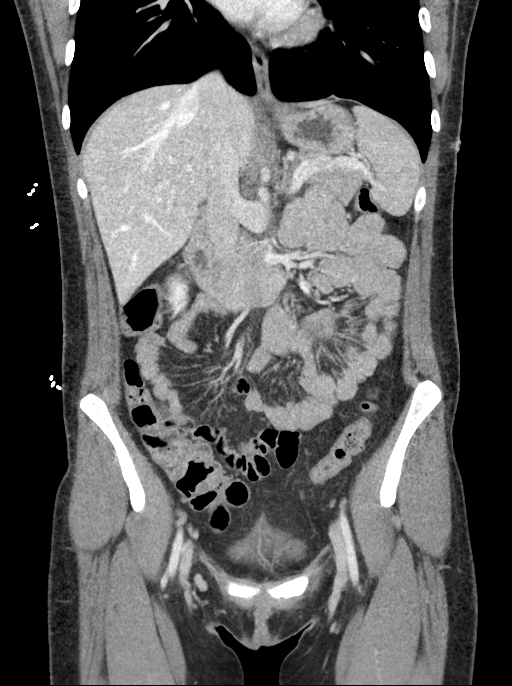
[im 62/111  soft-tissue]
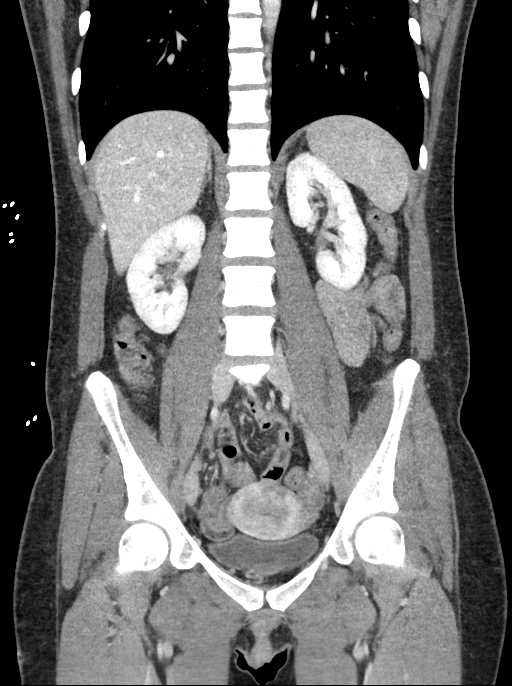

[15 of 46 positions shown; findings below may reference images not displayed]

FINDINGS: CTA CHEST FINDINGS

Cardiovascular: Pulmonary arteries are normal in caliber and patent.
Negative for significant pulmonary embolus or filling defect by CTA.

Thoracic aorta intact. No aneurysm or dissection. Negative for
mediastinal hemorrhage or hematoma. Normal heart size. No
pericardial effusion.

Central venous structures are patent.  No LALANDE process.

Mediastinum/Nodes: No enlarged mediastinal, hilar, or axillary lymph
nodes. Thyroid gland, trachea, and esophagus demonstrate no
significant findings.

Lungs/Pleura: Lungs are clear. No pleural effusion or pneumothorax.

Musculoskeletal: No chest wall abnormality. No acute or significant
osseous findings.

Review of the MIP images confirms the above findings.

CT ABDOMEN and PELVIS FINDINGS

Hepatobiliary: Focal fatty infiltration of liver along falciform
ligament. No other significant abnormality. Common bile nondilated.
Gallbladder nondistended.

Pancreas: Unremarkable. No pancreatic ductal dilatation or
surrounding inflammatory changes.

Spleen: Normal in size without focal abnormality.

Adrenals/Urinary Tract: Adrenal glands are unremarkable. Kidneys are
normal, without renal calculi, focal lesion, or hydronephrosis.
Bladder is unremarkable.

Stomach/Bowel: negative for bowel obstruction, significant
dilatation, ileus, or free air. No free fluid, fluid collection,
hemorrhage, hematoma, abscess or ascites. Appendix not visualized
with certainty. No acute inflammatory process.

Vascular/Lymphatic: No significant vascular findings are present. No
enlarged abdominal or pelvic lymph nodes.

Reproductive: Uterus and bilateral adnexa are unremarkable.

Other: No abdominal wall hernia or abnormality. No abdominopelvic
ascites.

Musculoskeletal: No acute or significant osseous findings.

Review of the MIP images confirms the above findings.
IMPRESSION: Negative for significant acute pulmonary embolus by CTA.

No other acute intrathoracic finding.

No acute intra-abdominal or pelvic finding.

## 2021-06-12 IMAGING — CT CT L SPINE W/O CM
3 series · 12 of 33 positions shown, 14 images · IV contrast (agent unspecified)
Comparison: None.

CLINICAL DATA: Thoracic spine and low back pain after injuries
suffered in multiple syncopal episodes today. Initial encounter.

EXAM:
CT Thoracic and Lumbar spine with contrast
TECHNIQUE: Multiplanar CT images of the thoracic and lumbar spine were
reconstructed from contemporary CT of the Chest, Abdomen, and Pelvis
CONTRAST:  None or No additional.

[Series 1: axial lumbar · axial · 0.36mm/px · z∈[-794,-604]mm · 4 of 139 slices shown, 5 images]
[im 22/139  soft-tissue]
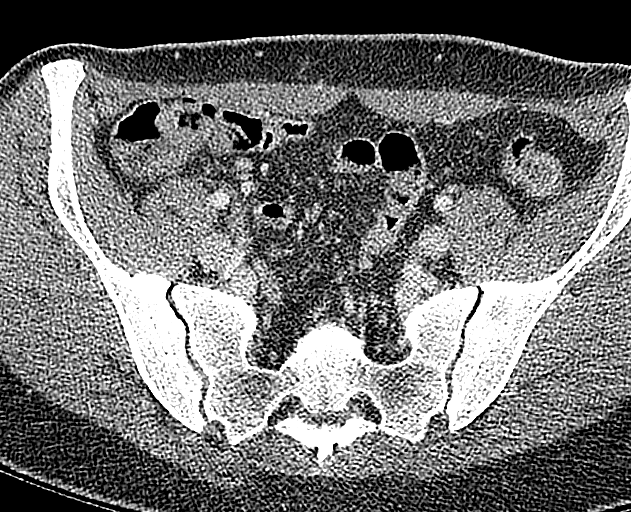
[im 22/139  bone]
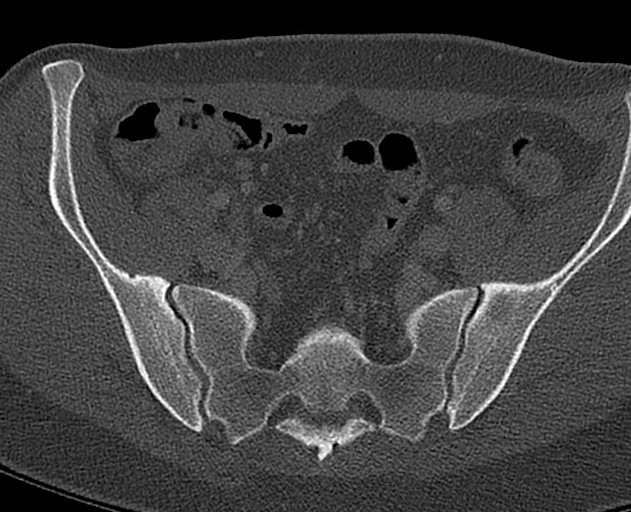
[im 54/139  bone]
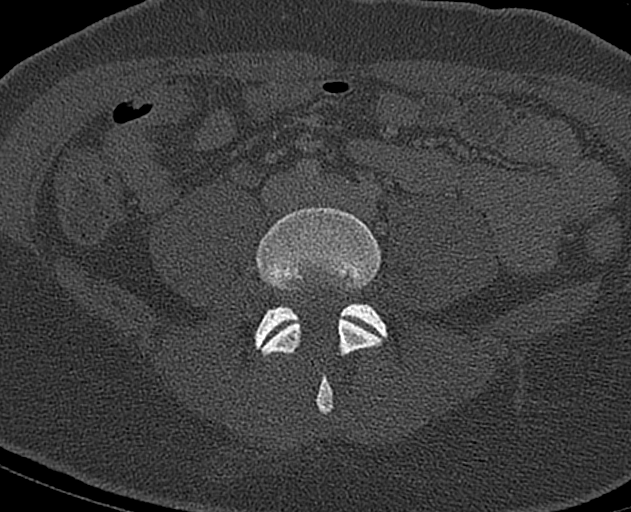
[im 85/139  bone]
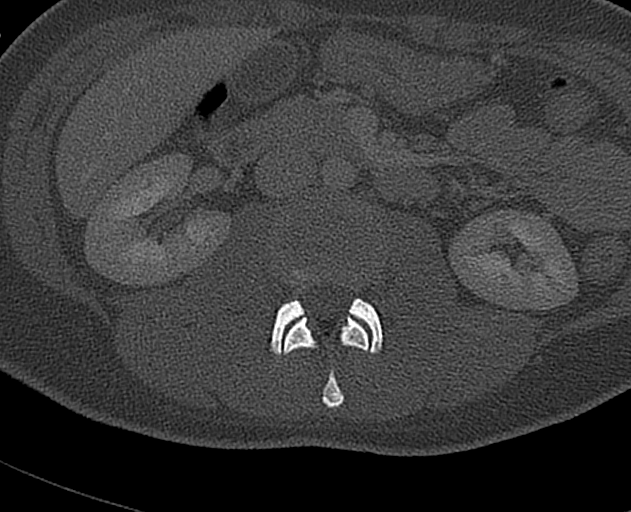
[im 117/139  bone]
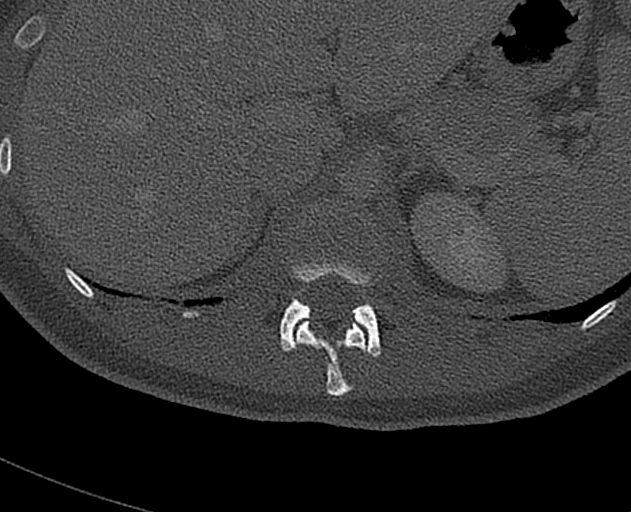

[Series 2: coronal lumbar · coronal · 0.49mm/px · 3 of 75 slices shown]
[im 15/75  bone]
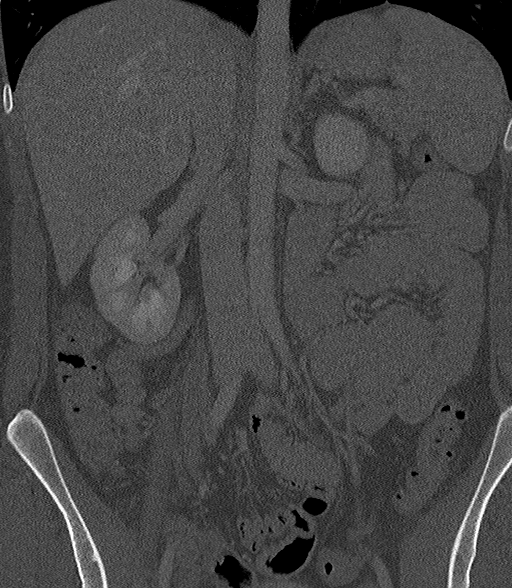
[im 30/75  bone]
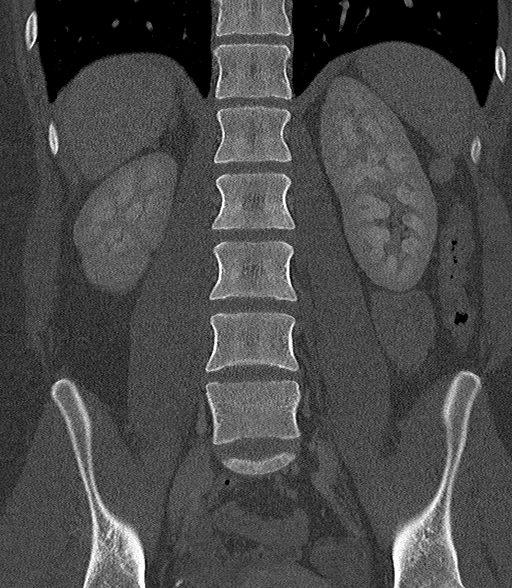
[im 45/75  bone]
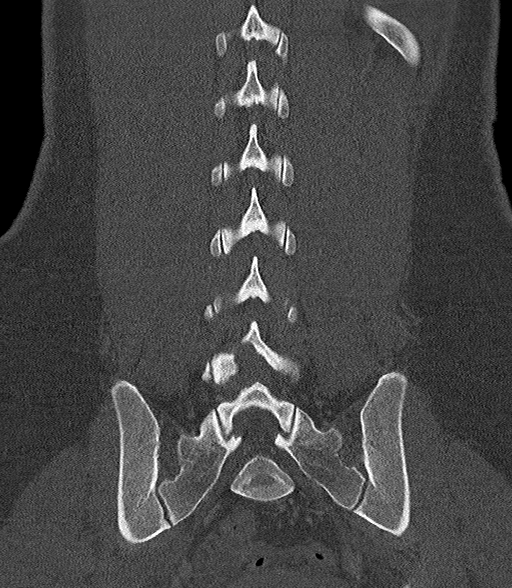

[Series 3: sag lumbar · sagittal · 0.31mm/px · 5 of 115 slices shown, 6 images]
[im 39/115  bone]
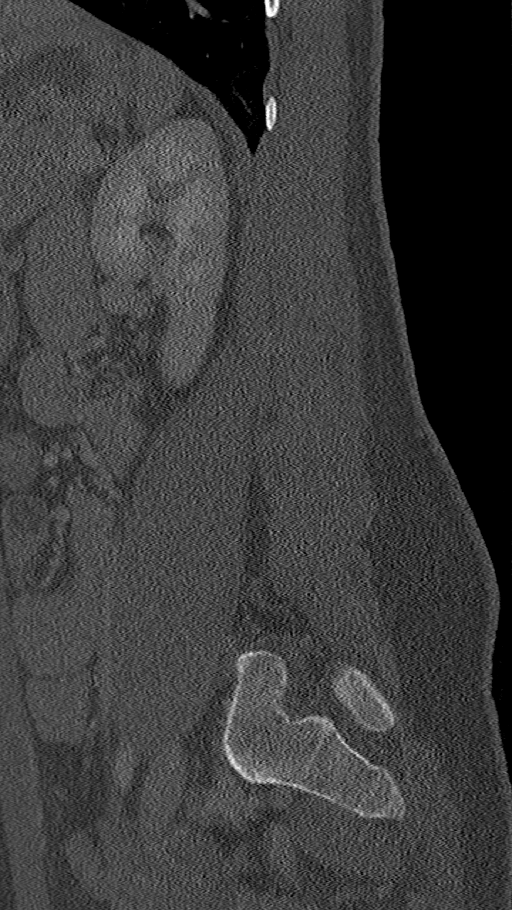
[im 48/115  bone]
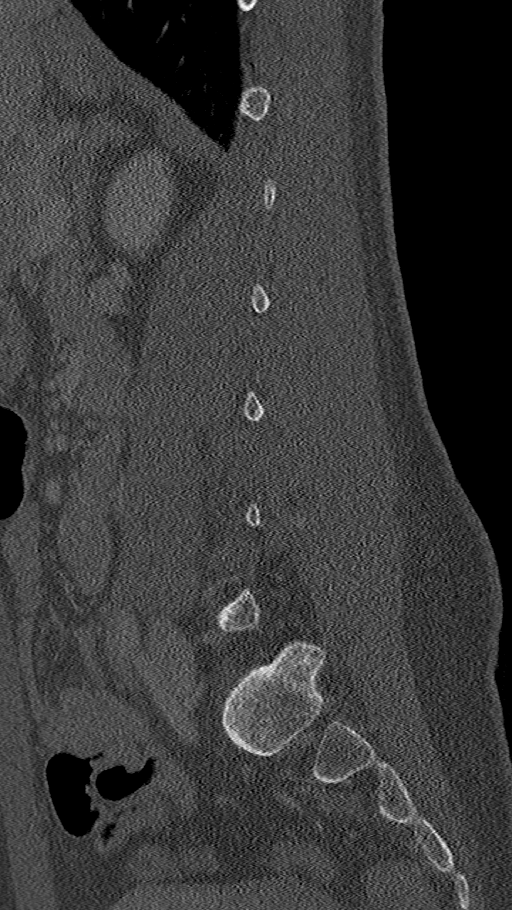
[im 58/115  soft-tissue]
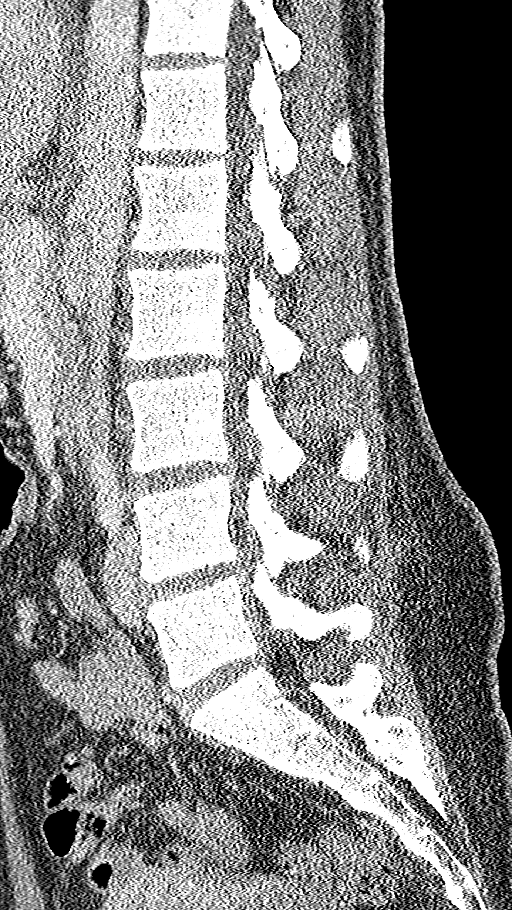
[im 58/115  bone]
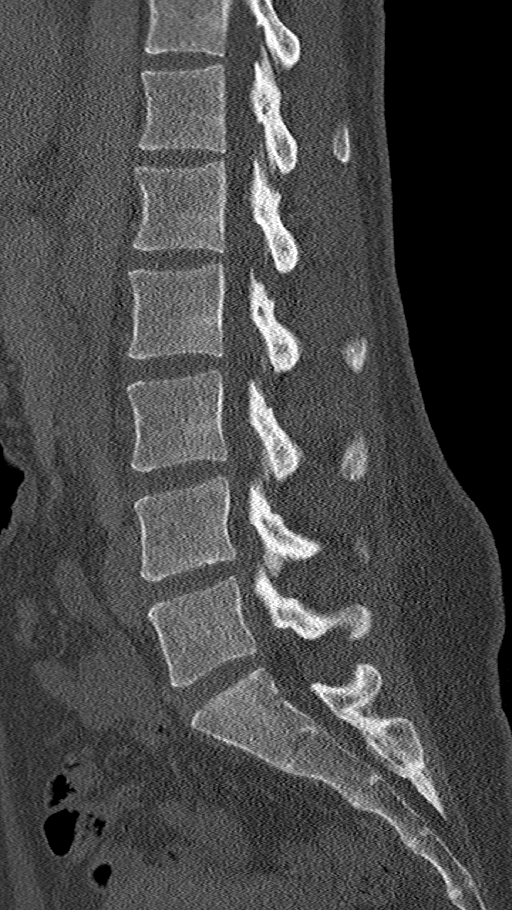
[im 67/115  bone]
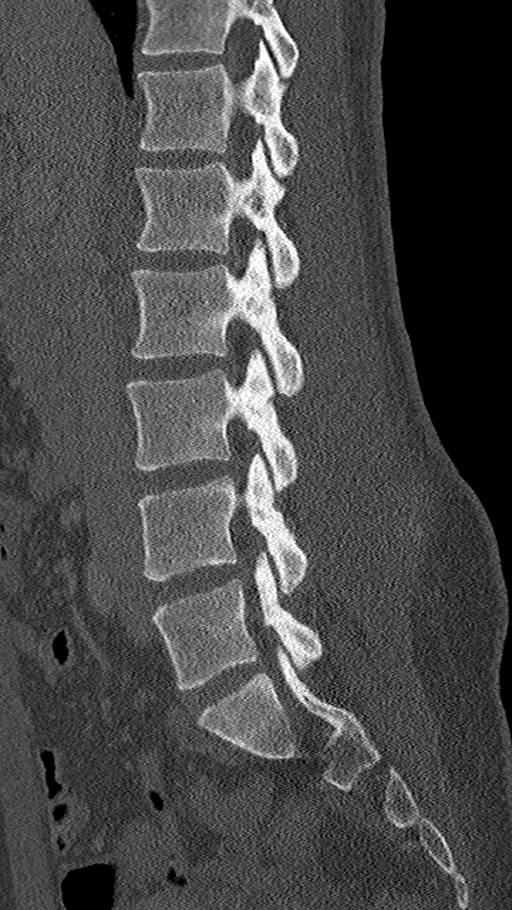
[im 77/115  bone]
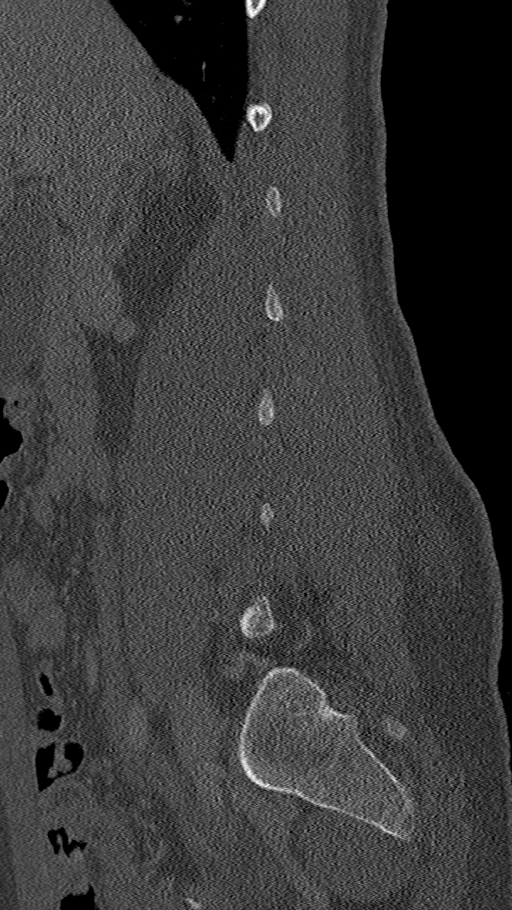

[12 of 33 positions shown; findings below may reference images not displayed]

FINDINGS: CT THORACIC SPINE FINDINGS

Alignment: Normal.

Vertebrae: No fracture or focal lesion.

Paraspinal and other soft tissues: See report of dedicated chest CT
today.

Disc levels: Intervertebral disc space height is maintained
throughout.

CT LUMBAR SPINE FINDINGS

Segmentation: Standard.

Alignment: Normal.

Vertebrae: No fracture or focal lesion.

Paraspinal and other soft tissues: See report of dedicated abdomen
and pelvis CT today.

Disc levels: Intervertebral disc space height is maintained.
IMPRESSION: Normal CT scans of the thoracic and lumbar spine.

## 2021-06-12 IMAGING — CT CT T SPINE W/O CM
3 series · 9 of 33 positions shown, 10 images · IV contrast (agent unspecified)
Comparison: None.

CLINICAL DATA: Thoracic spine and low back pain after injuries
suffered in multiple syncopal episodes today. Initial encounter.

EXAM:
CT Thoracic and Lumbar spine with contrast
TECHNIQUE: Multiplanar CT images of the thoracic and lumbar spine were
reconstructed from contemporary CT of the Chest, Abdomen, and Pelvis
CONTRAST:  None or No additional.

[Series 3: axial tspine · axial · 0.28mm/px · z∈[-447,-447]mm · 1 of 150 slices shown, 2 images]
[im 81/150  soft-tissue]
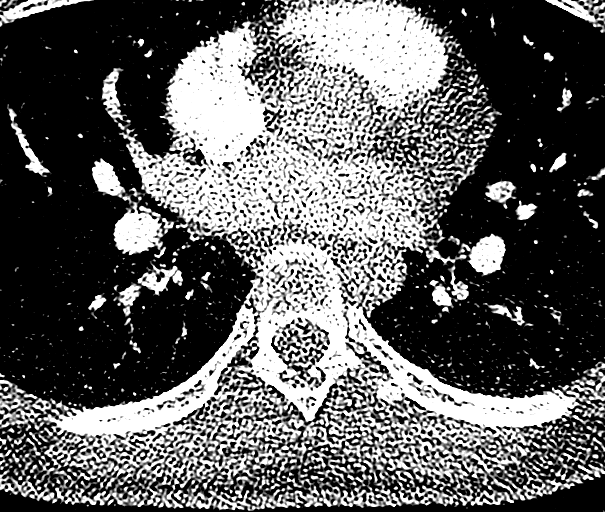
[im 81/150  bone]
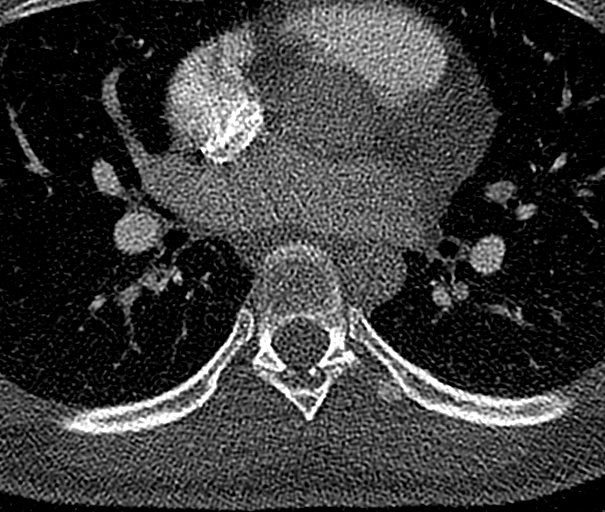

[Series 4: coronal tspine · coronal · 0.29mm/px · 3 of 58 slices shown]
[im 12/58  bone]
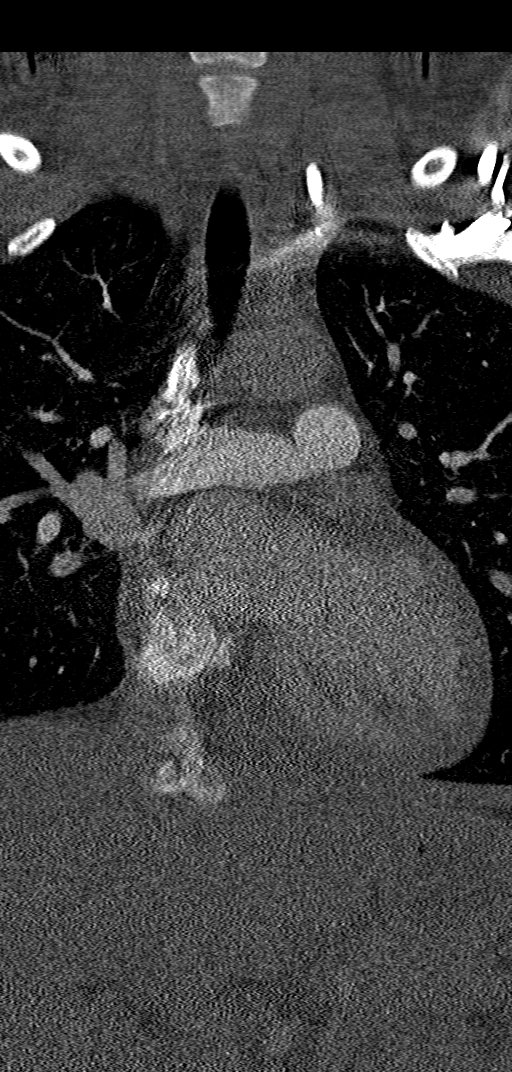
[im 23/58  bone]
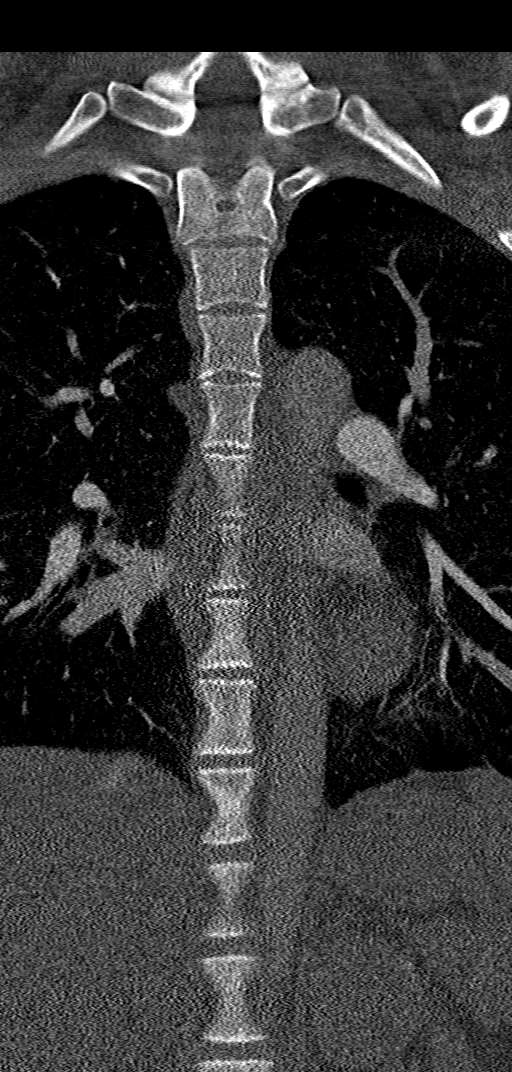
[im 35/58  bone]
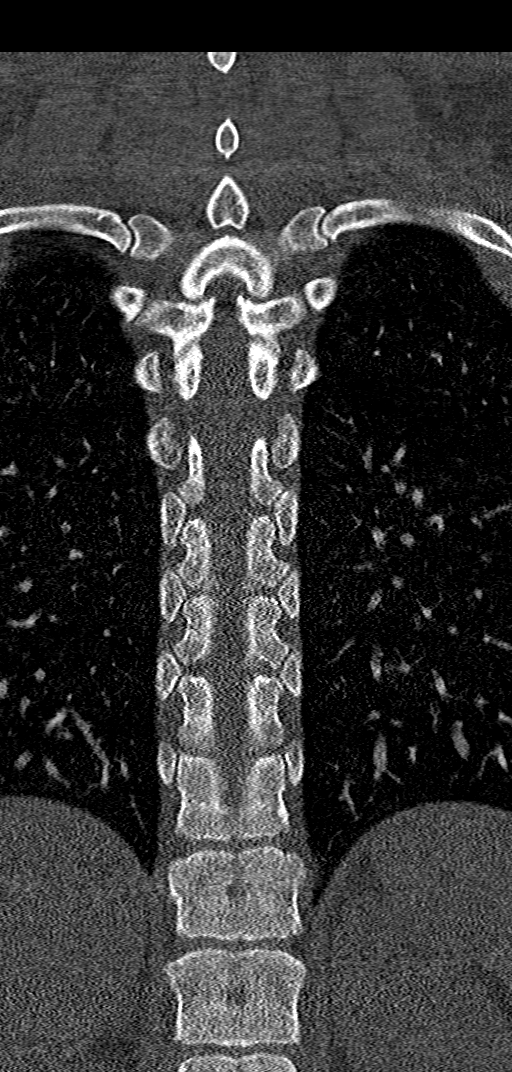

[Series 5: sag tspine · sagittal · 0.27mm/px · 5 of 45 slices shown]
[im 15/45  bone]
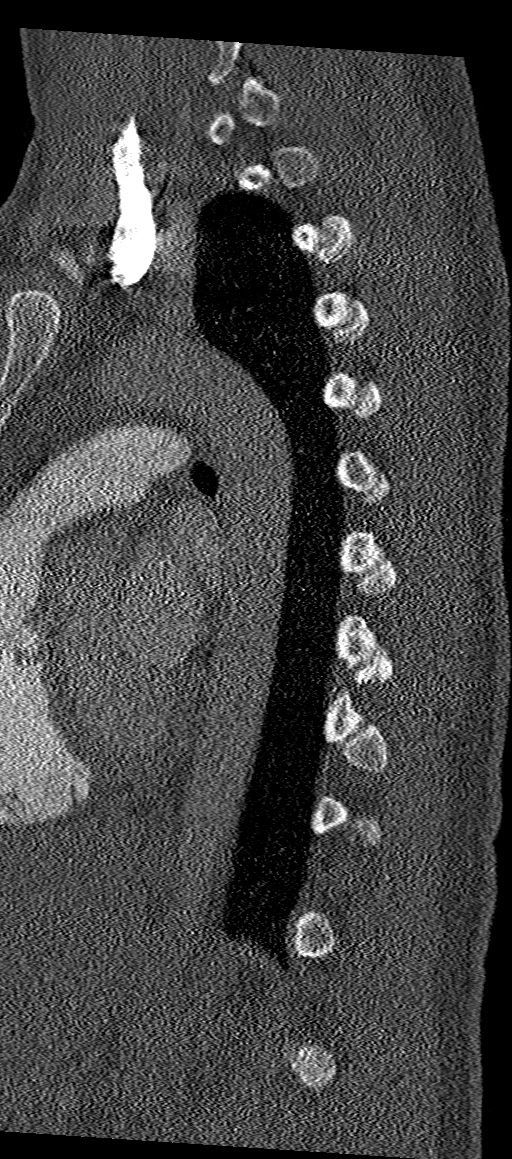
[im 19/45  bone]
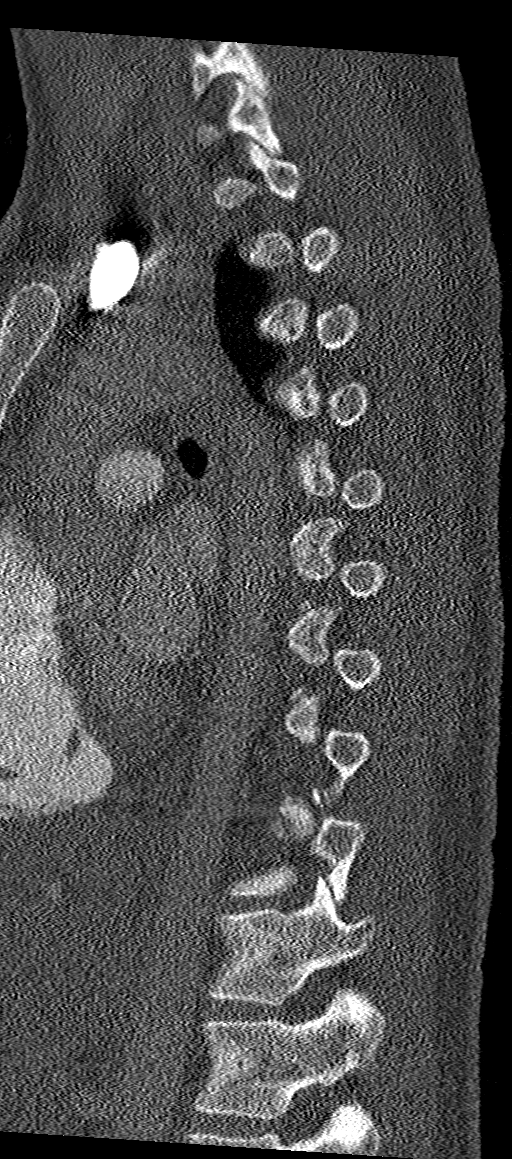
[im 23/45  bone]
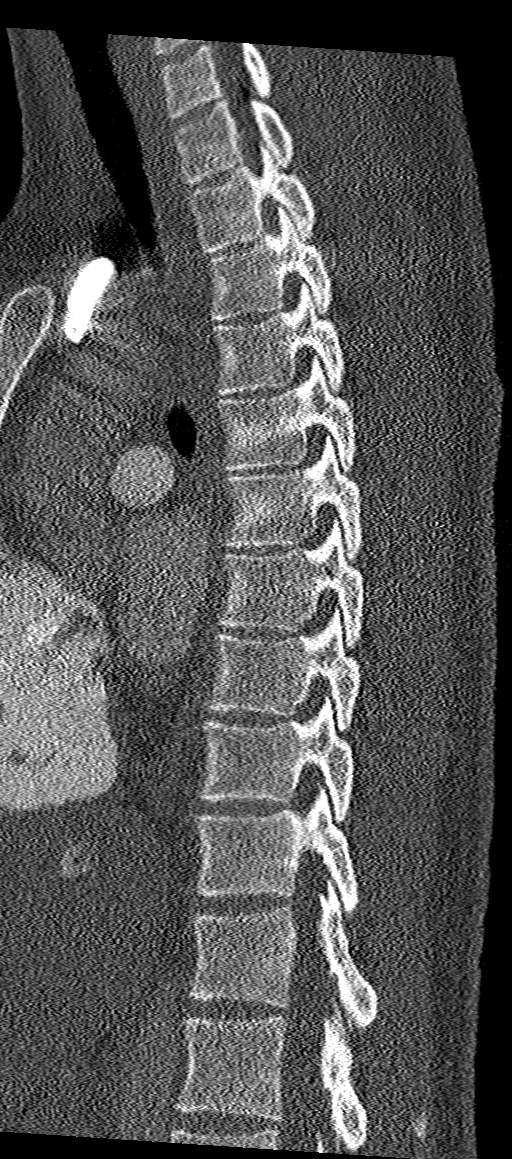
[im 26/45  bone]
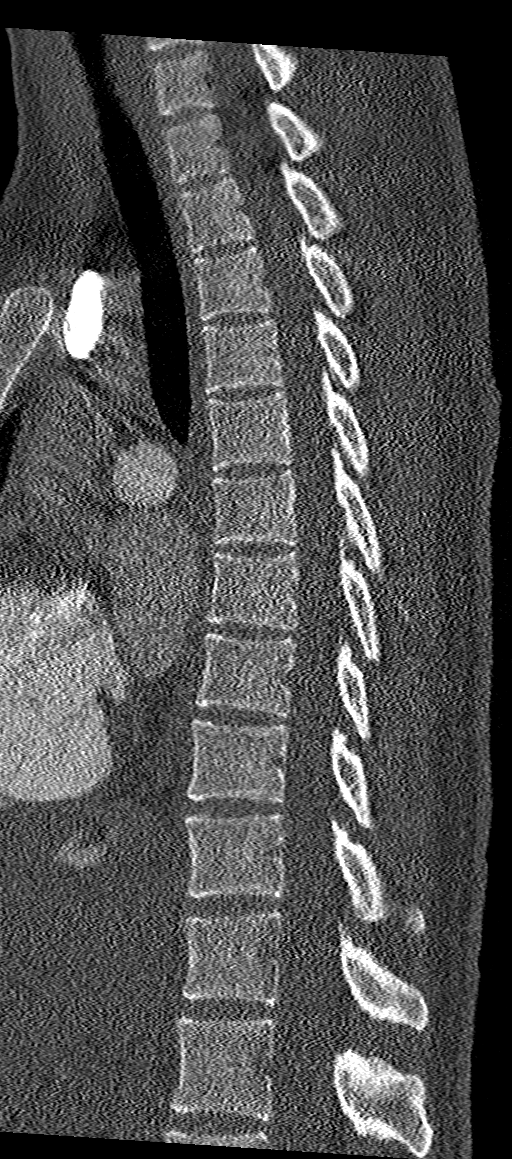
[im 30/45  bone]
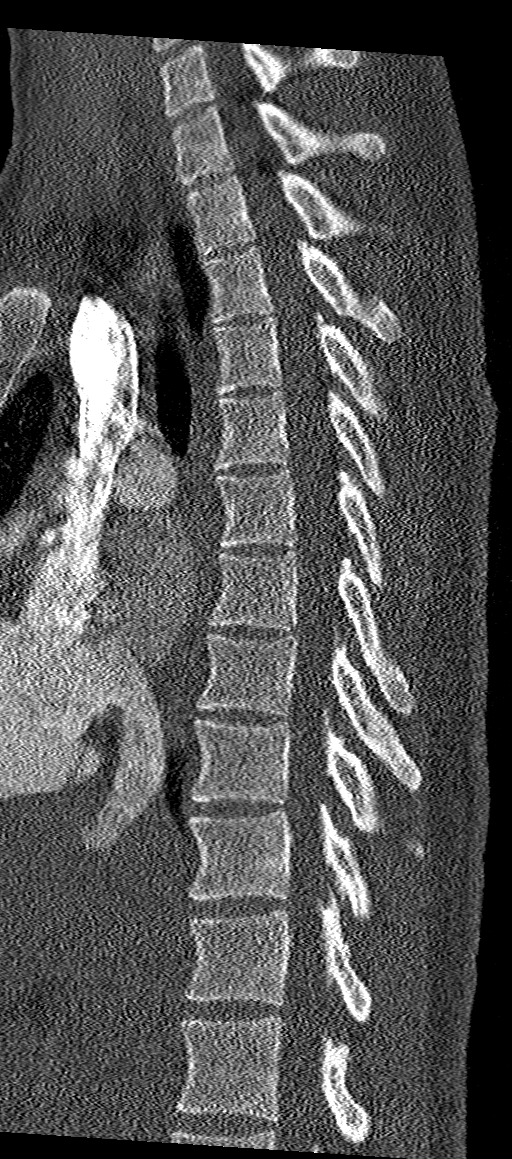

[9 of 33 positions shown; findings below may reference images not displayed]

FINDINGS: CT THORACIC SPINE FINDINGS

Alignment: Normal.

Vertebrae: No fracture or focal lesion.

Paraspinal and other soft tissues: See report of dedicated chest CT
today.

Disc levels: Intervertebral disc space height is maintained
throughout.

CT LUMBAR SPINE FINDINGS

Segmentation: Standard.

Alignment: Normal.

Vertebrae: No fracture or focal lesion.

Paraspinal and other soft tissues: See report of dedicated abdomen
and pelvis CT today.

Disc levels: Intervertebral disc space height is maintained.
IMPRESSION: Normal CT scans of the thoracic and lumbar spine.

## 2021-06-12 IMAGING — CT CT HEAD W/O CM
4 series · 17 of 47 positions shown, 19 images · non-contrast
Comparison: [DATE]

CLINICAL DATA: Trauma. Focal neuro findings. Two syncopal episodes.
Hit head on book case.

EXAM:
CT HEAD WITHOUT CONTRAST
TECHNIQUE: Contiguous axial images were obtained from the base of the skull
through the vertex without intravenous contrast.

[Series 3: head without · axial · non-contrast · 0.43mm/px · z∈[-170,-50]mm · 7 of 33 slices shown, 9 images]
[im 5/33  brain]
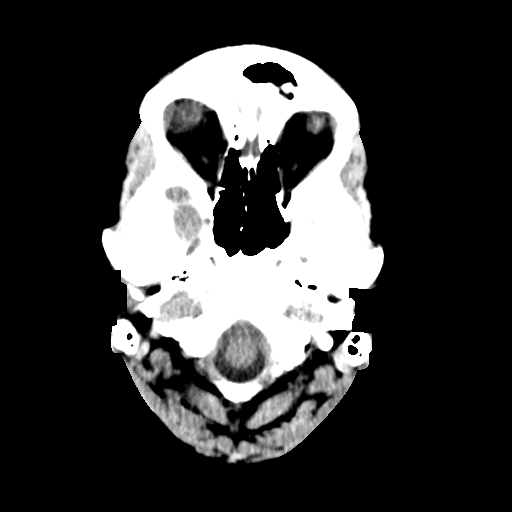
[im 5/33  bone]
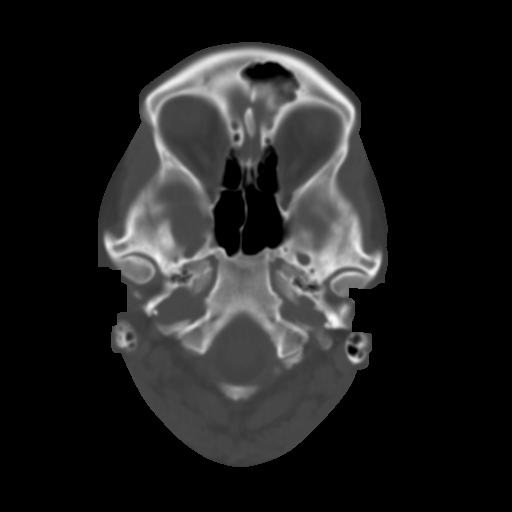
[im 9/33  brain]
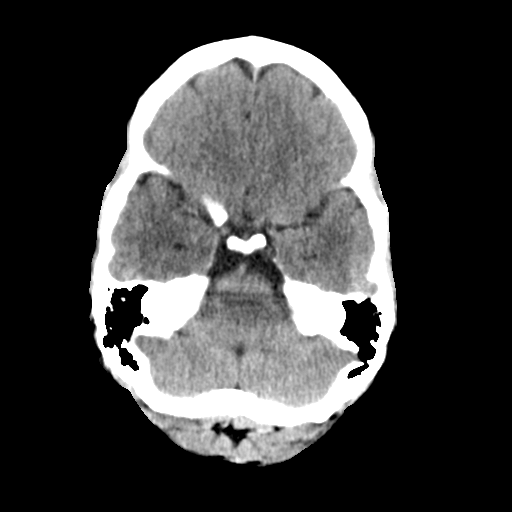
[im 13/33  brain]
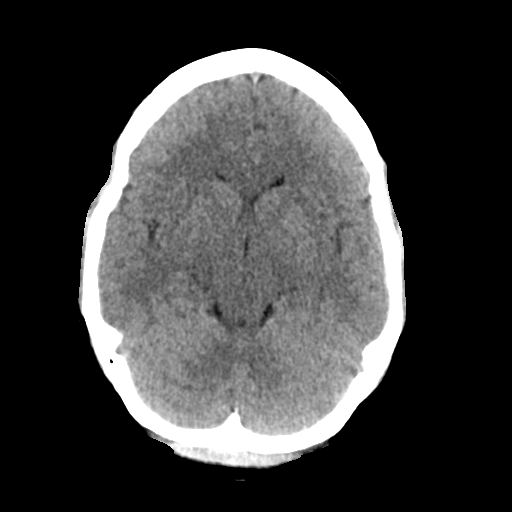
[im 17/33  brain]
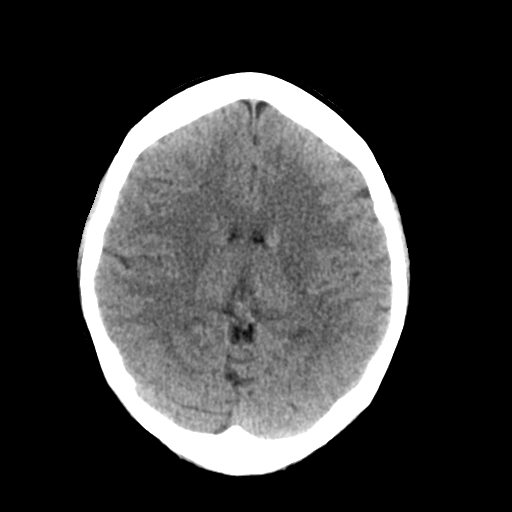
[im 21/33  brain]
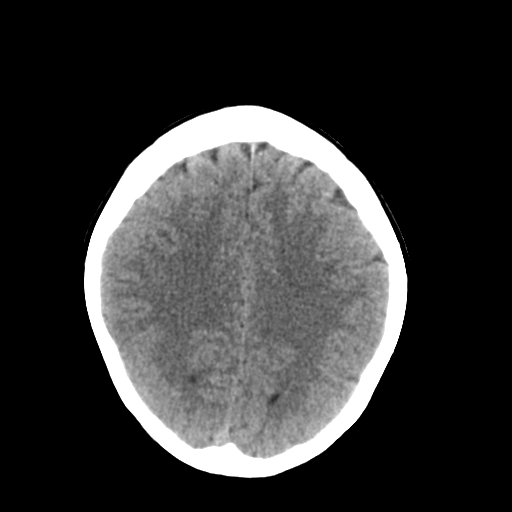
[im 21/33  bone]
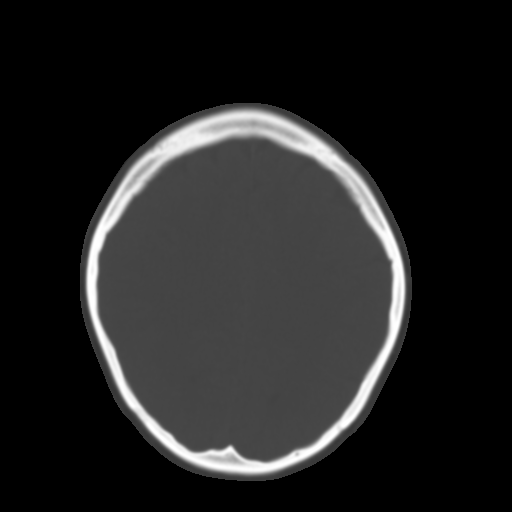
[im 25/33  brain]
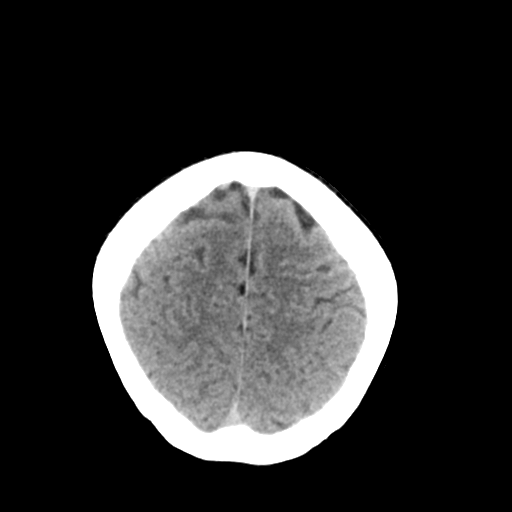
[im 29/33  brain]
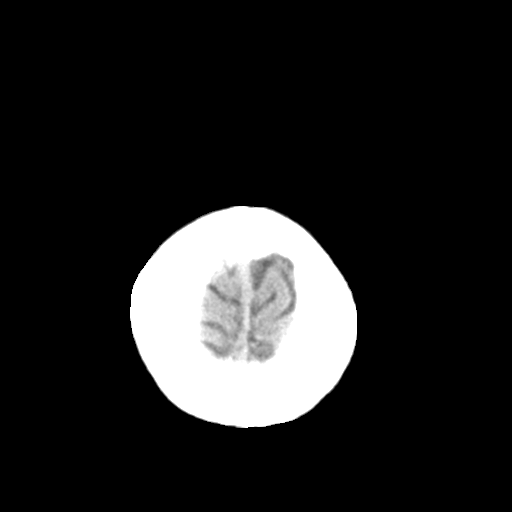

[Series 4: ax head bone · axial · 0.43mm/px · z∈[-169,-115]mm · 4 of 81 slices shown]
[im 9/81  bone]
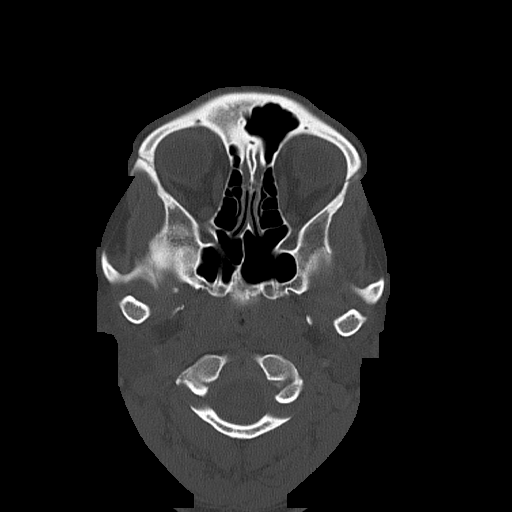
[im 17/81  bone]
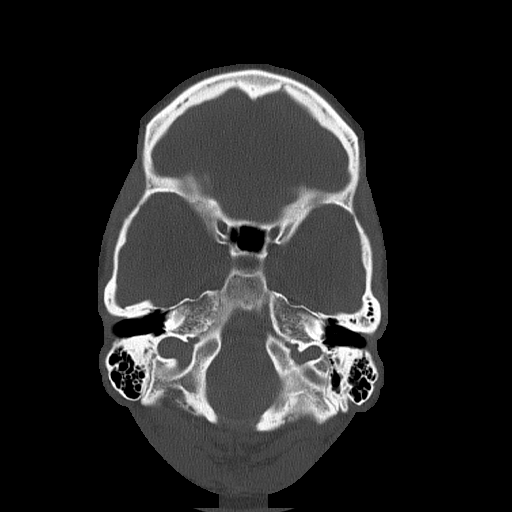
[im 25/81  bone]
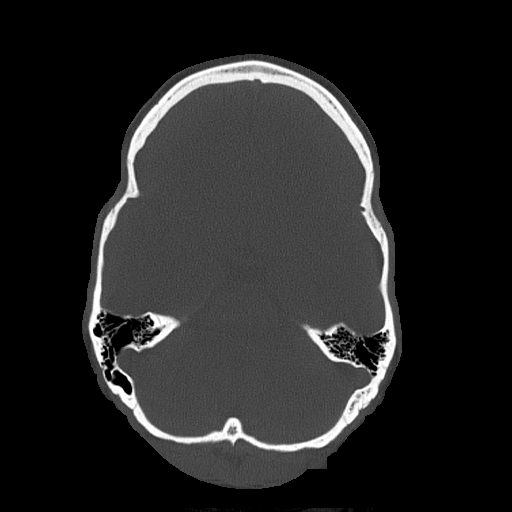
[im 37/81  bone]
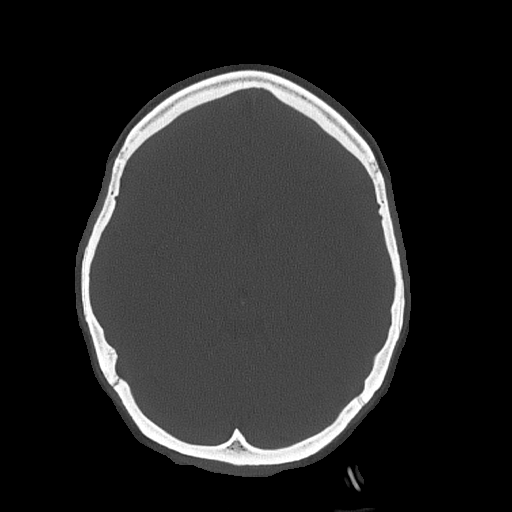

[Series 5: head without cor · coronal · non-contrast · 0.32mm/px · 3 of 63 slices shown]
[im 21/63  brain]
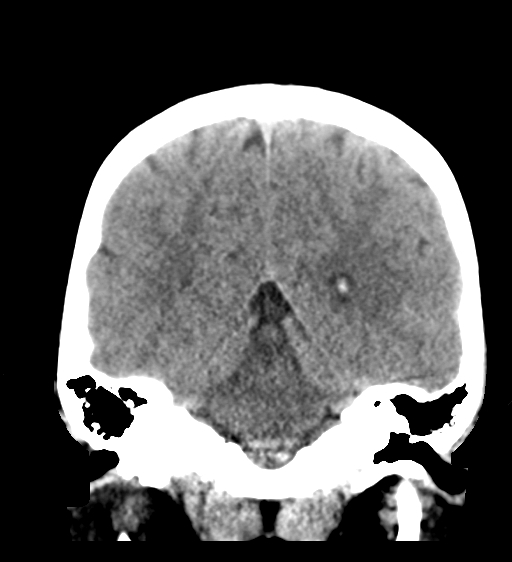
[im 28/63  brain]
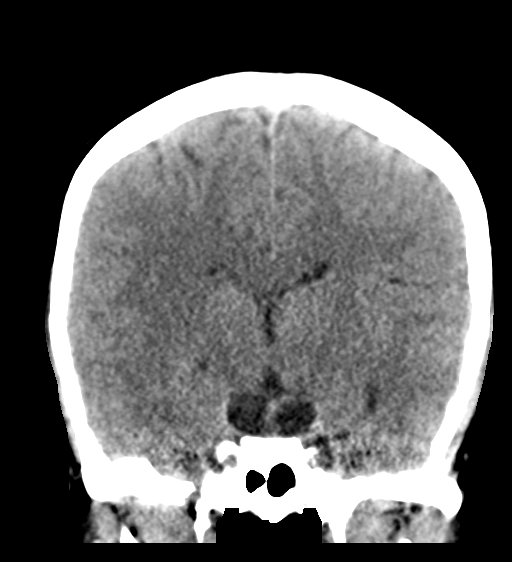
[im 35/63  brain]
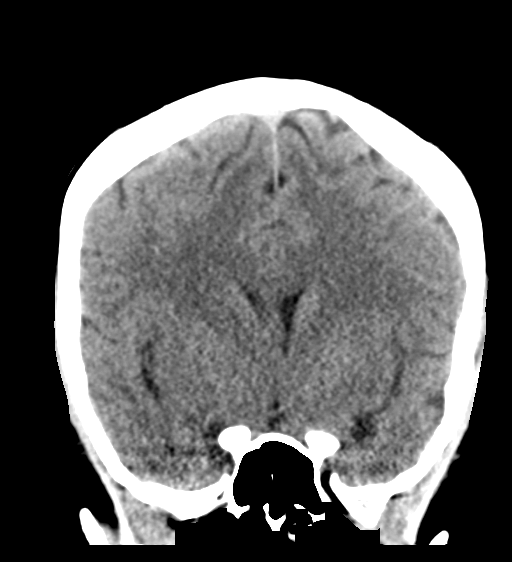

[Series 6: head without sag · sagittal · non-contrast · 0.35mm/px · 3 of 65 slices shown]
[im 22/65  brain]
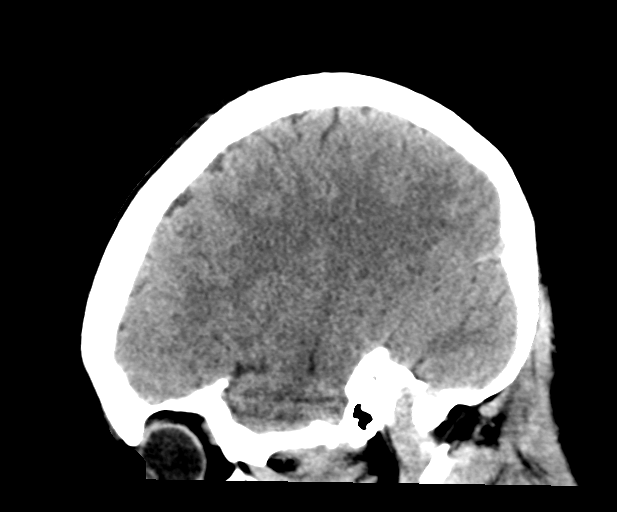
[im 33/65  brain]
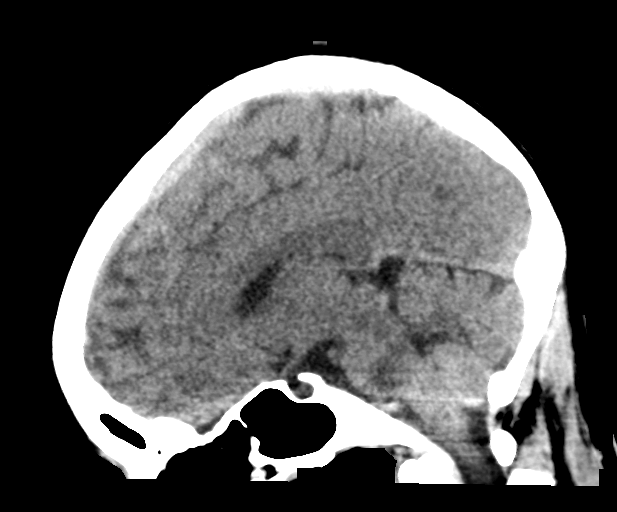
[im 43/65  brain]
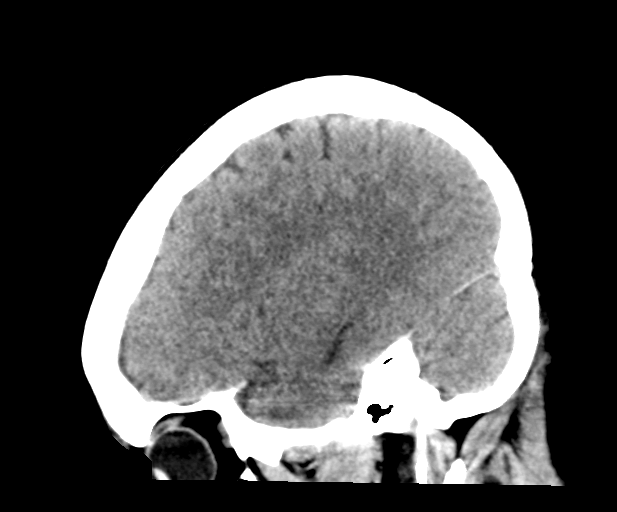

[17 of 47 positions shown; findings below may reference images not displayed]

FINDINGS: Brain: No evidence of acute infarction, hemorrhage, hydrocephalus,
extra-axial collection or mass lesion/mass effect.

Vascular: No hyperdense vessel or unexpected calcification.

Skull: Normal. Negative for fracture or focal lesion.

Sinuses/Orbits: No acute finding.

Other: None.
IMPRESSION: No acute intracranial abnormalities identified.

## 2021-06-12 MED ORDER — SODIUM CHLORIDE 0.9 % IV SOLN
25.0000 mg | Freq: Four times a day (QID) | INTRAVENOUS | Status: DC | PRN
  Administered 2021-06-12 (×2): 25 mg via INTRAVENOUS
  Filled 2021-06-12 (×2): qty 1

## 2021-06-12 MED ORDER — LORAZEPAM 2 MG/ML IJ SOLN
0.5000 mg | Freq: Once | INTRAMUSCULAR | Status: AC
  Administered 2021-06-12: 0.5 mg via INTRAVENOUS
  Filled 2021-06-12: qty 1

## 2021-06-12 MED ORDER — FENTANYL CITRATE (PF) 100 MCG/2ML IJ SOLN
50.0000 ug | Freq: Once | INTRAMUSCULAR | Status: AC
  Administered 2021-06-12: 50 ug via INTRAVENOUS
  Filled 2021-06-12: qty 2

## 2021-06-12 MED ORDER — LORAZEPAM 2 MG/ML IJ SOLN: 0.5000 mg | Freq: Once | INTRAMUSCULAR | Status: DC | PRN

## 2021-06-12 MED ORDER — HYDROMORPHONE HCL 1 MG/ML IJ SOLN
1.0000 mg | INTRAMUSCULAR | Status: DC | PRN
Start: 2021-06-12 — End: 2021-06-14
  Administered 2021-06-12 – 2021-06-14 (×7): 1 mg via INTRAVENOUS
  Filled 2021-06-12 (×8): qty 1

## 2021-06-12 MED ORDER — LACTATED RINGERS IV BOLUS
1000.0000 mL | Freq: Once | INTRAVENOUS | Status: AC
  Administered 2021-06-12: 1000 mL via INTRAVENOUS

## 2021-06-12 MED ORDER — SODIUM CHLORIDE 0.9 % IV SOLN
1.0000 g | Freq: Once | INTRAVENOUS | Status: AC
  Administered 2021-06-12: 1 g via INTRAVENOUS
  Filled 2021-06-12: qty 10

## 2021-06-12 MED ORDER — SODIUM CHLORIDE 0.9% FLUSH
3.0000 mL | Freq: Two times a day (BID) | INTRAVENOUS | Status: DC
  Administered 2021-06-12 – 2021-06-15 (×5): 3 mL via INTRAVENOUS

## 2021-06-12 MED ORDER — IOHEXOL 350 MG/ML SOLN
100.0000 mL | Freq: Once | INTRAVENOUS | Status: AC | PRN
  Administered 2021-06-12: 100 mL via INTRAVENOUS

## 2021-06-12 MED ORDER — ACETAMINOPHEN 325 MG PO TABS: 650.0000 mg | ORAL_TABLET | Freq: Four times a day (QID) | ORAL | Status: DC | PRN

## 2021-06-12 MED ORDER — ENOXAPARIN SODIUM 40 MG/0.4ML IJ SOSY
40.0000 mg | PREFILLED_SYRINGE | INTRAMUSCULAR | Status: DC
  Administered 2021-06-12 – 2021-06-14 (×3): 40 mg via SUBCUTANEOUS
  Filled 2021-06-12 (×3): qty 0.4

## 2021-06-12 MED ORDER — SENNOSIDES-DOCUSATE SODIUM 8.6-50 MG PO TABS
1.0000 | ORAL_TABLET | Freq: Every evening | ORAL | Status: DC | PRN
  Filled 2021-06-12: qty 1

## 2021-06-12 MED ORDER — SODIUM CHLORIDE 0.9 % IV SOLN
500.0000 mg | Freq: Once | INTRAVENOUS | Status: AC
  Administered 2021-06-12: 500 mg via INTRAVENOUS
  Filled 2021-06-12: qty 500

## 2021-06-12 MED ORDER — ACETAMINOPHEN 650 MG RE SUPP: 650.0000 mg | Freq: Four times a day (QID) | RECTAL | Status: DC | PRN

## 2021-06-12 NOTE — ED Provider Notes (Signed)
Emergency Medicine Provider Triage Evaluation Note  Abigail Harding , a 30 y.o. female  was evaluated in triage.  Pt complains of syncope, abd pain. Has not been able to tolerate PO intake in 3 days. Generalized abd pain. NBNB emesis. Does not get a menstrual cycle. Per EMS, multiple syncopal episode where she becomes bradycardic into 40s with syncope.  Review of Systems  Positive: Syncope, abd pain, emesis Negative: Fever, cough, back pain  Physical Exam  BP (!) 143/69 (BP Location: Right Arm)   Pulse 61   Temp 98.2 F (36.8 C) (Oral)   Resp 18   SpO2 99%  Gen:   Awake, no distress   Resp:  Normal effort  MSK:   Moves extremities without difficulty  Other:    Medical Decision Making  Medically screening exam initiated at 9:46 AM.  Appropriate orders placed.  Abigail Harding was informed that the remainder of the evaluation will be completed by another provider, this initial triage assessment does not replace that evaluation, and the importance of remaining in the ED until their evaluation is complete.  Syncope, abd pain, emesis   Dolan Xia A, PA-C 06/12/21 [id number]    Margarita Grizzle, MD 06/13/21 1327

## 2021-06-12 NOTE — ED Notes (Signed)
RN attempted report to 2W x1

## 2021-06-12 NOTE — ED Provider Notes (Signed)
[facility] EMERGENCY DEPARTMENT Provider Note   CSN: [id number] Arrival date & time: 06/12/21  1157     History No chief complaint on file.   Abigail Harding is a 30 y.o. female.  HPI 30 year old female presents today with chief complaint of syncope.  She reports that she has had abdominal pain with associated nausea and vomiting that began yesterday in the early morning hours.  She reports multiple episodes of syncope.  She was states that she feels like she stares and then passes out.  The longest episode of loss of consciousness 1/2 minutes.  States she does have a history of this in the past.  Per [facility] report heart rate had gone down to 40 when she had a syncopal episode at 1 point.  She reports that during these episodes she has struck her head and may have injured her low back.  She also hurt her right knee today when she fell.  She has had recent back surgery.  She states she had a surgery on the right side of her low back and has had weakness on her left side since the surgery.  She is followed for this at [facility].  She does not report there have been any changes in this.  She reports of some palpitations and mild associated dyspnea.  No significant chest pain.  Her abdomen hurts more on the right side.  She reports having a previous appendectomy but has her gallbladder.  She also reports a history of endometriosis with multiple cysts that are ruptured and pelvic pain in the past from this.  She is reports that she has had some abnormal vaginal discharge and dyspareunia.  She is on birth control.  Pills which she takes without a break.  She does not think that she is pregnant. Labs obtained at triage show negative pregnancy test, normal CBC, mild hypocalcemia and otherwise normal complete metabolic panel.     Past Medical History:  Diagnosis Date  . ADHD (attention deficit hyperactivity disorder)   . Anemia   . Anxiety   . Asthma   . Back pain   .  Endometriosis   . Headache   . History of fainting   . History of kidney stones    In the past  . Ovarian cyst   . PID (pelvic inflammatory disease)     Patient Active Problem List   Diagnosis Date Noted  . Health care maintenance 04/12/2018  . Abdominal pain affecting pregnancy 10/12/2017  . History of miscarriage 06/26/2017  . Nausea and vomiting during pregnancy 06/26/2017  . Positive blood pregnancy test 06/26/2017  . Inclusion cyst of vulva 05/29/2017  . History of D&C 02/19/2017  . Endometriosis determined by laparoscopy 01/02/2017  . Syncope 12/31/2016  . Headache 12/21/2016  . Right lower quadrant abdominal pain 10/31/2016  . Family history of endometriosis in first degree relative 10/31/2016  . Round ligament pain 10/31/2016  . Dysmenorrhea 10/31/2016  . Pelvic pain 10/31/2016  . Chronic pelvic pain in female 03/16/2016  . Generalized anxiety disorder 03/02/2016  . Oligo-ovulation 11/17/2015    Past Surgical History:  Procedure Laterality Date  . DILATION AND EVACUATION N/A 02/19/2017   Procedure: DILATATION AND EVACUATION;  Surgeon: Herold Harms, MD;  Location: [facility] ORS;  Service: Gynecology;  Laterality: N/A;  . LAPAROSCOPY    . LAPAROSCOPY N/A 12/25/2016   Procedure: LAPAROSCOPY DIAGNOSTIC WITH BIOPSIES;  Surgeon: Herold Harms, MD;  Location: [facility] ORS;  Service: Gynecology;  Laterality: N/A;  . TONSILLECTOMY       OB History     Gravida  2   Para  0   Term  0   Preterm  0   AB  1   Living  0      SAB  1   IAB  0   Ectopic  0   Multiple  0   Live Births  0           Family History  Problem Relation Age of Onset  . Diabetes Mother   . Diabetes Maternal Grandmother   . Heart disease Maternal Grandmother        GRT ALSO  . Breast cancer Paternal Grandmother        GRT  . Prostate cancer Maternal Uncle   . Ovarian cancer Neg Hx   . Colon cancer Neg Hx     Social History   Tobacco Use  . Smoking status: Never   . Smokeless tobacco: Former  Advertising account planner  . Vaping Use: Some days  . Substances: Nicotine, Flavoring  Substance Use Topics  . Alcohol use: Yes    Comment: RARE  . Drug use: No    Types: Marijuana    Comment: DAILY- X 4 YEARS    Home Medications Prior to Admission medications   Medication Sig Start Date End Date Taking? Authorizing Provider  acetaminophen (TYLENOL) 500 MG tablet Take 500 mg by mouth every 6 (six) hours as needed.    [provider]  Doxylamine-Pyridoxine 10-10 MG TBEC Take 10 mg by mouth daily. 07/17/17   Hildred Laser, MD  Prenat w/o A Vit-FeFum-FePo-FA (CONCEPT OB) 130-92.4-1 MG CAPS Take 1 capsule by mouth daily. 10/15/17   Hildred Laser, MD  Prenatal Vit-Fe Fumarate-FA (PRENATAL MULTIVITAMIN) TABS tablet Take 1 tablet by mouth daily at 12 noon.    [provider]  terconazole (TERAZOL 7) 0.4 % vaginal cream Place 1 applicator vaginally at bedtime. 10/30/17   Linzie Collin, MD    Allergies    Hydrocodone, Ketorolac, Morphine and related, and Tramadol  Review of Systems   Review of Systems  All other systems reviewed and are negative.  Physical Exam Updated Vital Signs BP (!) 145/83   Pulse 83   Temp 98.2 F (36.8 C) (Oral)   Resp 18   SpO2 99%   Physical Exam Vitals and nursing note reviewed.  Constitutional:      Appearance: Normal appearance.  HENT:     Head: Normocephalic.     Right Ear: External ear normal.     Left Ear: External ear normal.     Nose: Nose normal.     Mouth/Throat:     Mouth: Mucous membranes are moist.     Pharynx: Oropharynx is clear.  Eyes:     Pupils: Pupils are equal, round, and reactive to light.  Cardiovascular:     Rate and Rhythm: Normal rate and regular rhythm.  Pulmonary:     Effort: Pulmonary effort is normal.     Breath sounds: Normal breath sounds.  Abdominal:     General: Abdomen is flat.     Palpations: Abdomen is soft.     Tenderness: There is abdominal tenderness.     Comments:  Tenderness right lower quadrant and right upper quadrant  Musculoskeletal:        General: No swelling. Normal range of motion.     Cervical back: Normal range of motion.     Comments:  Exam reveals well-healing lumbar area incision with mild swelling noted at the site.  No erythema, no fluctuance and no warmth noted  Skin:    General: Skin is warm and dry.     Capillary Refill: Capillary refill takes less than 2 seconds.  Neurological:     General: No focal deficit present.     Cranial Nerves: No cranial nerve deficit.  Psychiatric:        Mood and Affect: Mood normal.        Behavior: Behavior normal.    ED Results / Procedures / Treatments   Labs (all labs ordered are listed, but only abnormal results are displayed) Labs Reviewed  COMPREHENSIVE METABOLIC PANEL - Abnormal; Notable for the following components:      Result Value   Glucose, Bld 110 (*)    Calcium 8.7 (*)    Alkaline Phosphatase 34 (*)    All other components within normal limits  CBC WITH DIFFERENTIAL/PLATELET  LIPASE, BLOOD  URINALYSIS, ROUTINE W REFLEX MICROSCOPIC  I-STAT BETA HCG BLOOD, ED ([facility], [facility], [facility] ONLY)    EKG EKG Interpretation  Date/Time:  Sunday June 12 2021 09:49:20 EDT Ventricular Rate:  64 PR Interval:  146 QRS Duration: 90 QT Interval:  420 QTC Calculation: 433 R Axis:   70 Text Interpretation: Normal sinus rhythm with sinus arrhythmia Normal ECG Confirmed by Margarita Grizzle [phone]) on 06/12/2021 11:00:02 AM  Radiology No results found.  Procedures .Critical Care  Date/Time: 06/12/2021 2:48 PM Performed by: Margarita Grizzle, MD Authorized by: Margarita Grizzle, MD   Critical care provider statement:    Critical care time (minutes):  45   Critical care end time:  06/12/2021 2:48 PM   Critical care was necessary to treat or prevent imminent or life-threatening deterioration of the following conditions:  Circulatory failure   Critical care was time spent personally by me on the following  activities:  Discussions with consultants, evaluation of patient's response to treatment, examination of patient, ordering and performing treatments and interventions, ordering and review of laboratory studies, ordering and review of radiographic studies, pulse oximetry, re-evaluation of patient's condition, obtaining history from patient or surrogate and review of old charts   Medications Ordered in ED Medications  fentaNYL (SUBLIMAZE) injection 50 mcg (has no administration in time range)  promethazine (PHENERGAN) 25 mg in sodium chloride 0.9 % 50 mL IVPB (has no administration in time range)    ED Course  I have reviewed the triage vital signs and the nursing notes.  Pertinent labs & imaging results that were available during my care of the patient were reviewed by me and considered in my medical decision making (see chart for details).    MDM Rules/Calculators/A&P                           1- syncope- unclear etiology- orthostatics pending.  Labs wnl, ekg without acute abnormality.   BP stable  CT head done due to injury from syncope- CTA chest done with some mild chest discomfort- PE ruled out as source of syncope  2- abdominal pain-ct negative, vaginal d/c on exam with some ttp over uterus.  3- recent back surgery with ongoing left leg numbness Recon of ls and thoracic spine  Discussed with Dr. Mcarthur Rossetti and will see for admission  Final Clinical Impression(s) / ED Diagnoses Final diagnoses:  Pain  Syncope, unspecified syncope type  Generalized abdominal pain  Vaginal discharge    Rx /  DC Orders ED Discharge Orders     None        Margarita Grizzleay, Rhiannon Sassaman, MD 06/12/21 [phone]

## 2021-06-12 NOTE — ED Triage Notes (Signed)
Pt to triage via [facility].  Reports abd pain since yesterday with nausea and diarrhea.  History of endometriosis.  EMS called this morning for 2 syncopal episodes.  Hit back on bookcase on first syncopal episode and hit forehead on night stand and back of head on wall during 2nd syncopal episode.  Pt had 4 syncopal episodes with EMS and fell on carpet with them.  EMS states pt starts shaking prior to event and had + LOC for approx 1 1/2 min.  Reports HR drops to 40s during syncopal event.  20 g L AC.  NS 1 liter given PTA and Fentanyl 100 mcg given by EMS for severe abd pain.

## 2021-06-12 NOTE — ED Notes (Signed)
Echo at bedside

## 2021-06-12 NOTE — ED Notes (Signed)
RN attempted report x1.  

## 2021-06-12 NOTE — ED Notes (Signed)
Pt has had a total of 8 syncopal episodes while RN was at bedside. MD notified.

## 2021-06-12 NOTE — H&P (Addendum)
Date: 06/12/2021               Patient Name:  Abigail Harding MRN: [id number]  DOB: August 21, 1991 Age / Sex: 30 y.o., female   PCP: [facility], [facility]         Medical Service: Internal Medicine Teaching Service         Attending Physician: Dr. Oswaldo Done, Marquita Palms, *    First Contact: Dr. Alroy Bailiff Pager: [phone]  Second Contact: Dr. Mcarthur Rossetti Pager: [phone]       After Hours (After 5p/  First Contact Pager: [phone]  weekends / holidays): Second Contact Pager: [phone]   Chief Complaint: Syncope   History of Present Illness: Abigail Harding is a 30 y.o. female with a past medical history of endometriosis, PID, PCOS, anxiety, and history of multiple syncopal episodes who presents today with chief complaint of multiple syncopal episodes since yesterday.  Patient states that after she came home from a bar last night, she was "not feeling well."  She denies alcohol use last night.  She reports that "when her body is in too much pain" she will have episodes of blacking out. These last anywhere from a few seconds to a minute and a half. Her mother, who is with her today, states that she has had about 30 total syncopal episodes since this morning (including one episode while talking to her in the room). When she has these episodes, she will have a blank stare on her face before passing out. Patient states that her heart rate has been as low as 48 during these episodes. She has hit her head and knees on several occasions. Denies loss of bladder control or fecal incontinence during these episodes. She also endorses abdominal pain, chest pressure, nausea, dry heaving, decreased appetite, headache, palpitations. Her abdominal pain is located in her RLQ, and in her throat and describes it as a burning pain.  She was given fentanyl in the ED, which has not touched the pain. She describes having a chest pressure and a sensation that "my heart is beating out of my chest."  Also has some abnormal  vaginal discharge, has been in a lot of pain since she had her pelvic exam in the ED.  In terms of other past medical history, she has had recent back surgery on the right side of her low back. She endorses continued numbness and weakness on her left side since the procedure.  She currently is on birth control.  In terms of her prior syncopal episodes, she states that this has been going on for years ever since she had her daughter, who is age 37 now.  These episodes always lasted for few seconds. The last episode she had before yesterday was about 2 weeks ago.   Prior workup for syncope:  March 2018: Pt presented to [facility] with similar syncopal episodes, seen by neurology and thought to be POTS (however, episodes could occur without any change in posture), also considered seizures or chiari malformation. EEG was unremarkable. Holter monitor was normal. MRI brain was unremarkable.   May 2018: Presented to [facility] a second time with two more episodes of syncope since her last visit. Obtained ambulatory prolonged 72 hour ambulatory video EEG and was started on lamotrigine, 25 mg nightly for one week, then 25 mg BID.  July 2018: Patient attended follow-up appt with [facility]. She had no syncopal episodes since May 2018. Continued lamotrigine, 25 mg BID.  October 2018: Syncope  continued to be stable. Lamotrigine discontinued due to pregnancy  May 2019: Started having syncopal episodes after delivery of her child. Had 24-hour EEG monitoring which showed no epileptiform discharges. 2D echo showed no structural abnormalities, carotid US negative for significant stenosis. Recommended tilt table test, cardiac event monitor. Did not restart Lamictal as her spells were considered to be unlikely epileptic. Considered that they could be d/t non-organic etiology.   June 2019: Pt had multiple episodes since previous appointment. Ordered tilt table test and referred to cardiology for loop monitoring and  other workup for recurrent syncope. 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 Recommended evaluation with psychology/psychiatry.  August 2019: Seen by cardiology, no specific workup done at that point, recommended repeat follow-up in 6 months.  Meds:  Birth control, duloxetine  Allergies: Allergies as of 06/12/2021 - Review Complete 11/20/2018  Allergen Reaction Noted   Hydrocodone Itching 12/03/2016   Ketorolac Itching and Nausea And Vomiting 11/22/2015   Morphine and related Itching 10/25/2016   Tramadol Nausea And Vomiting and Other (See Comments) 06/19/2011   Past Medical History:  Diagnosis Date   ADHD (attention deficit hyperactivity disorder)    Anemia    Anxiety    Asthma    Back pain    Endometriosis    Headache    History of fainting    History of kidney stones    In the past   Ovarian cyst    PID (pelvic inflammatory disease)     Family History:  Heart disease - grandparents Colon cancer, brain tumor - grandfather Epilepsy in her mother  Social History:  Works at a bar.  Review of Systems: A complete ROS was negative except as per HPI.   Physical Exam: Blood pressure 108/62, pulse (!) 53, temperature 98.2 F (36.8 C), temperature source Oral, resp. rate 16, SpO2 97 %, unknown if currently breastfeeding.  General: Normal appearance, resting comfortably in bed, anxious HE: Normocephalic, atraumatic , EOMI, Conjunctivae normal ENT: No congestion, no rhinorrhea, no exudate or erythema  Cardiovascular: Normal rate, regular rhythm.  No murmurs, rubs, or gallops Pulmonary : Effort normal, breath sounds normal. No wheezes, rales, or rhonchi Abdominal: soft, bowel sounds present, TTP in RLQ > LLQ.  TTP in epigastric area. No rebound or guarding. Musculoskeletal: no swelling , deformity, injury, or tenderness in extremities Skin: Warm, dry , erythema, or rash, there are some bruises over the patient's forehead and  right knee Neuro: Alert, CN II-XII grossly intact. Sensation intact throughout. Psychiatric/Behavioral:  normal mood, normal behavior    EKG: personally reviewed my interpretation is normal sinus rhythm  Imaging:   CT Head Wo Contrast  Result Date: 06/12/2021 IMPRESSION: No acute intracranial abnormalities identified.   CT Angio Chest PE W and/or Wo Contrast  Result Date: 06/12/2021 IMPRESSION: Negative for significant acute pulmonary embolus by CTA. No other acute intrathoracic finding. No acute intra-abdominal or pelvic finding.  CT ABDOMEN PELVIS W CONTRAST  Result Date: 06/12/2021 IMPRESSION: Negative for significant acute pulmonary embolus by CTA. No other acute intrathoracic finding. No acute intra-abdominal or pelvic finding.   CT T-SPINE NO CHARGE  Result Date: 06/12/2021 MPRESSION: Normal CT scans of the thoracic and lumbar spine.   CT L-SPINE NO CHARGE  Result Date: 06/12/2021 IMPRESSION: Normal CT scans of the thoracic and lumbar spine.    Assessment & Plan by Problem: Abigail Harding is a 30 y.o. female with a past medical history of endometriosis, PID, PCOS, anxiety, and history  of multiple syncopal episodes who presents with multiple syncopal episodes x 1 day of unclear etiology.    Active Problems:   Syncope  Syncope, unclear etiology: Patient has a history of recurrent syncopal episodes for which she has undergone extensive work-up since 2018 per [facility] records (see above). In brief, she had unremarkable EEG and brain imaging in 2018, however she was started on Lamictal regimen and had no other syncopal episodes until after discontinuing this medication due to its teratogenic effects.  After giving birth, she followed up with [facility] neurology again, and Lamictal was not restarted. 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  Review of records shows that a table tilt test was  recommended, however not able to locate these results or any other work-up since 2019.  Today, the patient endorses ~ 30 syncopal episodes since yesterday. These episodes last about 30-90 seconds and come on when she is in a lot of pain.  Her mother states that, during these episodes, she will have a blank stare on her face before passing out. During these episodes, the patient will have bradycardia down to the high 40s, low 50s before regaining consciousness. The episodes are not postural. Work-up in the ED significant for negative orthostatics, negative pregnancy test, labs WNL except for mild hypocalcemia, unremarkable EKG, unremarkable CT head, CTA, CT abdomen pelvis. Clinical picture consistent with most likely vasovagal syncope given these episodes occur after patient is having severe pain. Also could be seizures, given description of these episodes provided by her mother. In the past, her syncopal episodes seem to have improved with Lamictal (this medication is useful in treating epilepsy and in stabilizing mood). Other items on the differential include cardiac syncope; patient placed on cardiac monitoring and will get echo today.  -EEG pending, echo pending -UA pending -HIV antibody pending -TSH pending -Trend CBC/BMP -48 hr Cardiac monitoring -Neuro checks -PT/OT consult  Lower abdominal pain: Pt has a history of endometriosis with multiple cysts that are ruptured and has had pelvic pain in the past. Also reports having abnormal vaginal discharge. Per ED notes, had TTP over uterus. CTA negative, wet prep with WBC. Differential includes ruptured cyst, no obvious infectious etiology on CTA. Currently empirically treating for G/C d/t vaginal discharge and TTP over the uterus.  - Continue ceftriaxone/azithromycin - IV dilaudid 1 mg q4h PRN - Phenergan PRN  Dispo: Admit patient to Observation with expected length of stay less than 2 midnights.  Signed: Andrey Campanile, MD 06/12/2021,  4:38 PM  Pager: [phone] After 5pm on weekdays and 1pm on weekends: On Call pager: [phone]

## 2021-06-12 NOTE — Progress Notes (Signed)
  Echocardiogram 2D Echocardiogram has been performed.  Delcie Roch 06/12/2021, 5:34 PM

## 2021-06-12 NOTE — ED Notes (Signed)
Pt had syncopal episode during triage.

## 2021-06-12 NOTE — ED Notes (Signed)
5C rejected bed

## 2021-06-13 ENCOUNTER — Observation Stay (HOSPITAL_COMMUNITY): Payer: Medicaid Other

## 2021-06-13 DIAGNOSIS — F429 Obsessive-compulsive disorder, unspecified: Secondary | ICD-10-CM | POA: Diagnosis present

## 2021-06-13 DIAGNOSIS — Z8249 Family history of ischemic heart disease and other diseases of the circulatory system: Secondary | ICD-10-CM | POA: Diagnosis not present

## 2021-06-13 DIAGNOSIS — M545 Low back pain, unspecified: Secondary | ICD-10-CM | POA: Diagnosis present

## 2021-06-13 DIAGNOSIS — N941 Unspecified dyspareunia: Secondary | ICD-10-CM | POA: Diagnosis present

## 2021-06-13 DIAGNOSIS — F909 Attention-deficit hyperactivity disorder, unspecified type: Secondary | ICD-10-CM | POA: Diagnosis present

## 2021-06-13 DIAGNOSIS — G8929 Other chronic pain: Secondary | ICD-10-CM | POA: Diagnosis present

## 2021-06-13 DIAGNOSIS — N809 Endometriosis, unspecified: Secondary | ICD-10-CM | POA: Diagnosis present

## 2021-06-13 DIAGNOSIS — R55 Syncope and collapse: Secondary | ICD-10-CM | POA: Diagnosis present

## 2021-06-13 DIAGNOSIS — R001 Bradycardia, unspecified: Secondary | ICD-10-CM | POA: Diagnosis present

## 2021-06-13 DIAGNOSIS — G40409 Other generalized epilepsy and epileptic syndromes, not intractable, without status epilepticus: Secondary | ICD-10-CM | POA: Diagnosis present

## 2021-06-13 DIAGNOSIS — R569 Unspecified convulsions: Secondary | ICD-10-CM

## 2021-06-13 DIAGNOSIS — Z833 Family history of diabetes mellitus: Secondary | ICD-10-CM | POA: Diagnosis not present

## 2021-06-13 DIAGNOSIS — R0789 Other chest pain: Secondary | ICD-10-CM | POA: Diagnosis present

## 2021-06-13 DIAGNOSIS — G4700 Insomnia, unspecified: Secondary | ICD-10-CM | POA: Diagnosis present

## 2021-06-13 DIAGNOSIS — I471 Supraventricular tachycardia: Secondary | ICD-10-CM | POA: Diagnosis present

## 2021-06-13 DIAGNOSIS — Z20822 Contact with and (suspected) exposure to covid-19: Secondary | ICD-10-CM | POA: Diagnosis present

## 2021-06-13 DIAGNOSIS — N898 Other specified noninflammatory disorders of vagina: Secondary | ICD-10-CM | POA: Diagnosis present

## 2021-06-13 DIAGNOSIS — Z803 Family history of malignant neoplasm of breast: Secondary | ICD-10-CM | POA: Diagnosis not present

## 2021-06-13 DIAGNOSIS — Z820 Family history of epilepsy and other diseases of the nervous system: Secondary | ICD-10-CM | POA: Diagnosis not present

## 2021-06-13 DIAGNOSIS — R112 Nausea with vomiting, unspecified: Secondary | ICD-10-CM | POA: Diagnosis present

## 2021-06-13 DIAGNOSIS — R102 Pelvic and perineal pain: Secondary | ICD-10-CM | POA: Diagnosis present

## 2021-06-13 DIAGNOSIS — Z87442 Personal history of urinary calculi: Secondary | ICD-10-CM | POA: Diagnosis not present

## 2021-06-13 DIAGNOSIS — Z800 Family history of malignant neoplasm of digestive organs: Secondary | ICD-10-CM | POA: Diagnosis not present

## 2021-06-13 DIAGNOSIS — F411 Generalized anxiety disorder: Secondary | ICD-10-CM | POA: Diagnosis present

## 2021-06-13 LAB — GC/CHLAMYDIA PROBE AMP (~~LOC~~) NOT AT ARMC
Chlamydia: NEGATIVE
Comment: NEGATIVE
Comment: NORMAL
Neisseria Gonorrhea: NEGATIVE

## 2021-06-13 LAB — BASIC METABOLIC PANEL
Anion gap: 6 (ref 5–15)
BUN: <5 mg/dL — ABNORMAL LOW (ref 6–20)
CO2: 29 mmol/L (ref 22–32)
Calcium: 8.9 mg/dL (ref 8.9–10.3)
Chloride: 105 mmol/L (ref 98–111)
Creatinine, Ser: 0.81 mg/dL (ref 0.44–1.00)
GFR, Estimated: >60 mL/min (ref >60)
Glucose, Bld: 97 mg/dL (ref 70–99)
Potassium: 3.7 mmol/L (ref 3.5–5.1)
Sodium: 140 mmol/L (ref 135–145)

## 2021-06-13 LAB — CBC
HCT: 37.5 % (ref 36.0–46.0)
Hemoglobin: 12.6 g/dL (ref 12.0–15.0)
MCH: 30.7 pg (ref 26.0–34.0)
MCHC: 33.6 g/dL (ref 30.0–36.0)
MCV: 91.5 fL (ref 80.0–100.0)
Platelets: 156 10*3/uL (ref 150–400)
RBC: 4.1 MIL/uL (ref 3.87–5.11)
RDW: 12 % (ref 11.5–15.5)
WBC: 6.2 10*3/uL (ref 4.0–10.5)
nRBC: 0 % (ref 0.0–0.2)

## 2021-06-13 LAB — PROTIME-INR
INR: 1.2 (ref 0.8–1.2)
Prothrombin Time: 14.8 seconds (ref 11.4–15.2)

## 2021-06-13 LAB — T4, FREE: Free T4: 1.5 ng/dL — ABNORMAL HIGH (ref 0.61–1.12)

## 2021-06-13 LAB — HIV ANTIBODY (ROUTINE TESTING W REFLEX): HIV Screen 4th Generation wRfx: NONREACTIVE

## 2021-06-13 LAB — TSH: TSH: 5.7 u[IU]/mL — ABNORMAL HIGH (ref 0.350–4.500)

## 2021-06-13 IMAGING — MR MR HEAD WO/W CM
12 of 14 series · 38 of 48 positions shown · IV contrast ([ID] GADAVIST)
Comparison: CT from [DATE].

CLINICAL DATA: Initial evaluation for neuro deficit, stroke
suspected.

EXAM:
MRI HEAD WITHOUT AND WITH CONTRAST
TECHNIQUE: Multiplanar, multiecho pulse sequences of the brain and surrounding
structures were obtained without and with intravenous contrast.
CONTRAST:  7mL GADAVIST GADOBUTROL 1 MMOL/ML IV SOLN

[Series 5: DWI · axial · 3.0mm · 0.88mm/px · z∈[-18,+122]mm · 4 of 48 slices shown (1 of 4)]
[im 1/48]
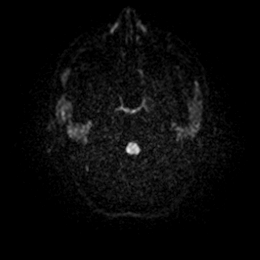
[im 16/48]
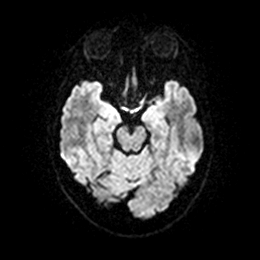
[im 32/48]
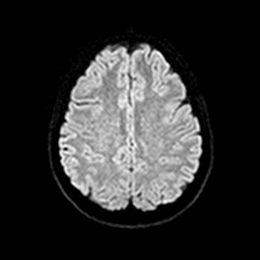
[im 48/48]
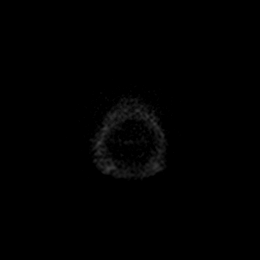

[Series 6: DWI · axial · 3.0mm · 0.88mm/px · z∈[-18,+122]mm · 4 of 48 slices shown (2 of 4)]
[im 1/48]
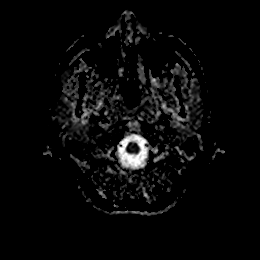
[im 16/48]
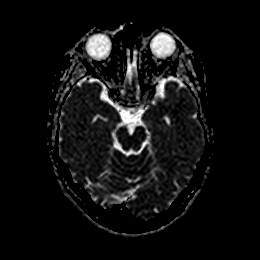
[im 32/48]
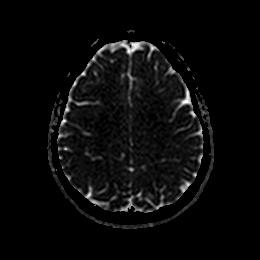
[im 48/48]
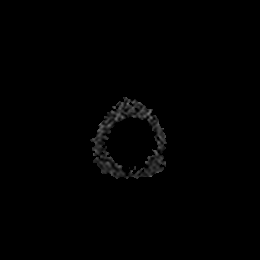

[Series 7: DWI · coronal · 4.0mm · 0.88mm/px · 3 of 36 slices shown (3 of 4)]
[im 1/36]
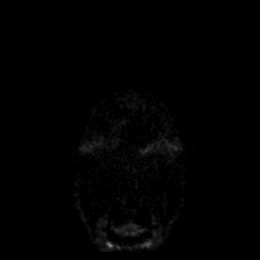
[im 18/36]
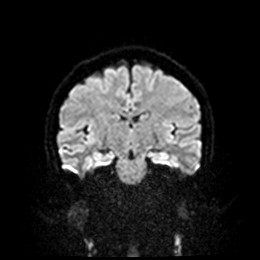
[im 36/36]
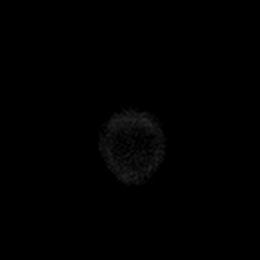

[Series 8: DWI · coronal · 4.0mm · 0.88mm/px · 3 of 36 slices shown (4 of 4)]
[im 1/36]
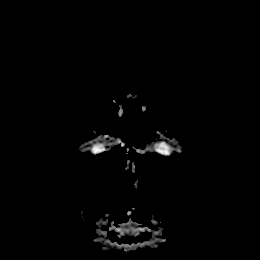
[im 18/36]
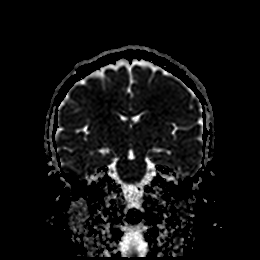
[im 36/36]
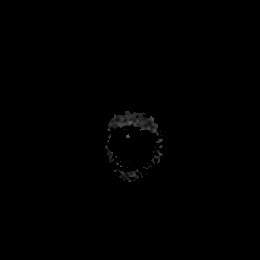

[Series 9: T1 · sagittal · 5.0mm · 0.75mm/px · 2 of 23 slices shown]
[im 1/23]
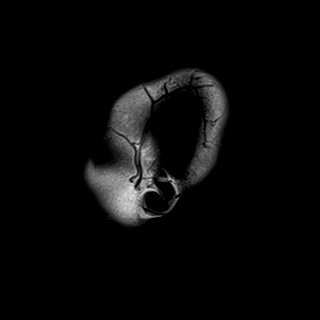
[im 23/23]
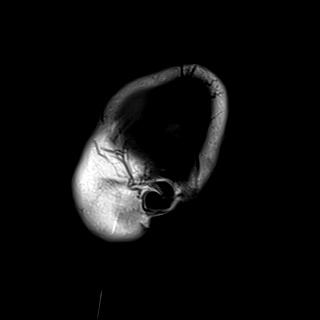

[Series 10: T2 · axial · 5.0mm · 0.72mm/px · z∈[-20,+123]mm · 2 of 25 slices shown]
[im 1/25]
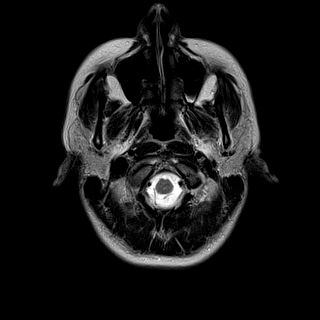
[im 25/25]
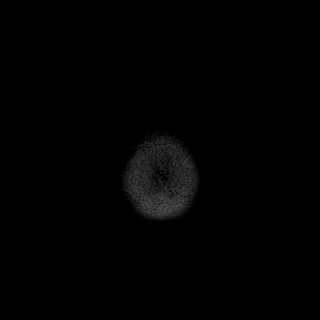

[Series 11: FLAIR · axial · 5.0mm · 0.45mm/px · z∈[-21,+122]mm · 2 of 25 slices shown]
[im 1/25]
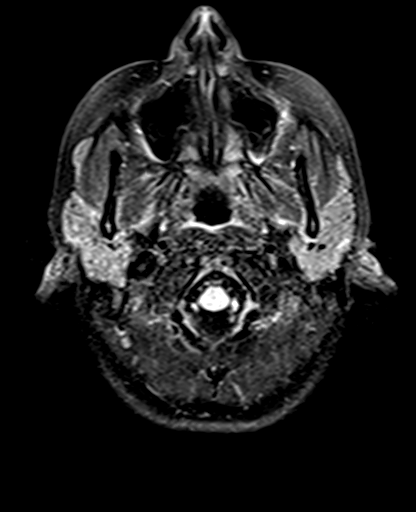
[im 25/25]
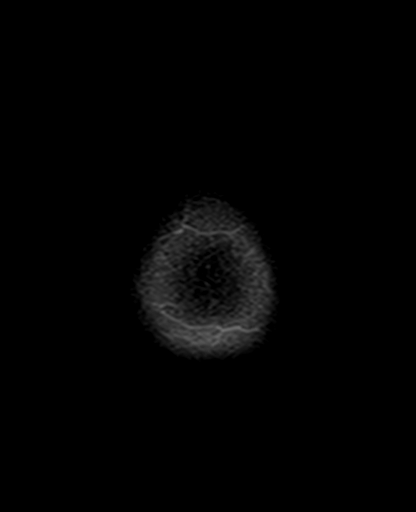

[Series 12: mag_images · axial · 3.0mm · 0.90mm/px · z∈[-25,+127]mm · 5 of 52 slices shown]
[im 1/52]
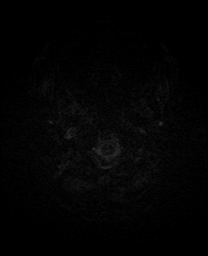
[im 13/52]
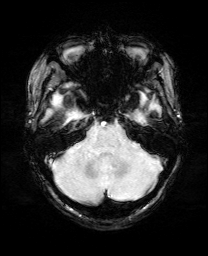
[im 26/52]
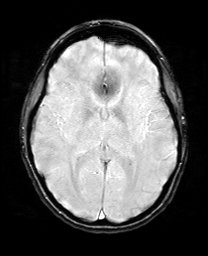
[im 39/52]
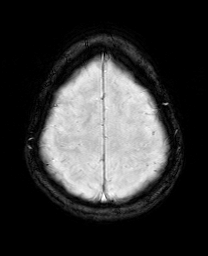
[im 52/52]
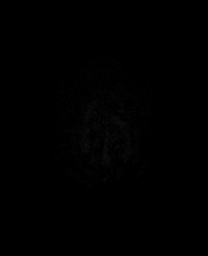

[Series 14: swi_images · axial · 3.0mm · 0.90mm/px · z∈[-25,+127]mm · 5 of 52 slices shown]
[im 1/52]
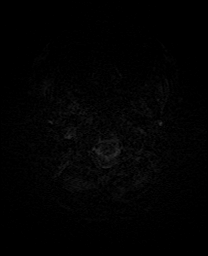
[im 13/52]
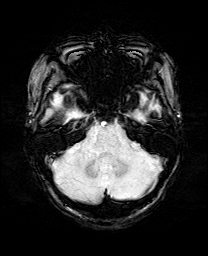
[im 26/52]
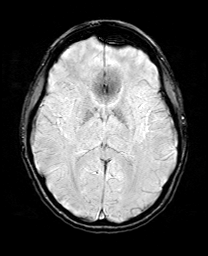
[im 39/52]
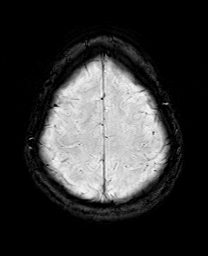
[im 52/52]
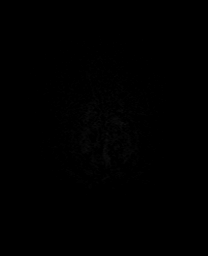

[Series 15: mip_images(sw) · axial · 24.0mm · 0.90mm/px · z∈[-15,+116]mm · 4 of 45 slices shown]
[im 1/45]
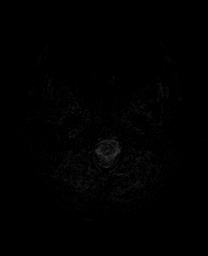
[im 15/45]
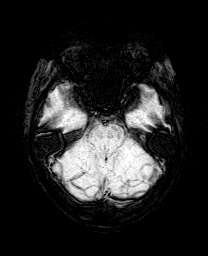
[im 30/45]
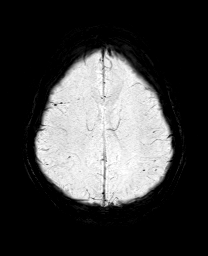
[im 45/45]
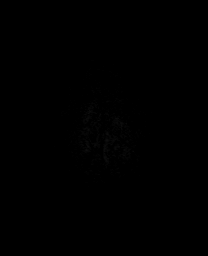

[Series 17: T2 post-contrast · coronal · 5.0mm · 0.90mm/px · 2 of 28 slices shown]
[im 1/28]
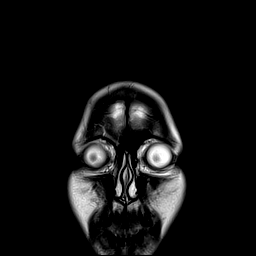
[im 28/28]
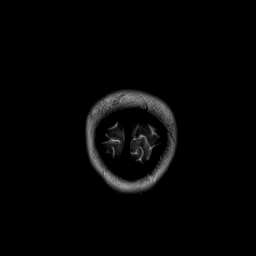

[Series 19: T1 post-contrast · coronal · 5.0mm · 0.43mm/px · 2 of 28 slices shown]
[im 1/28]
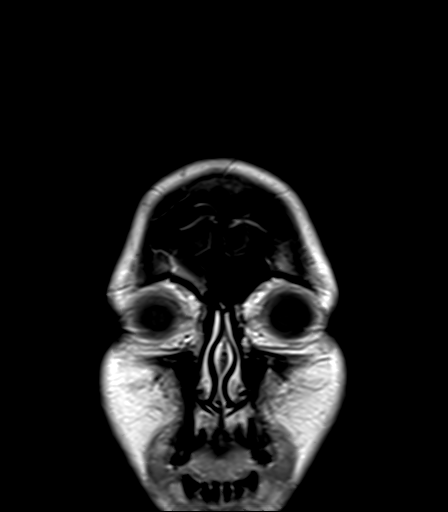
[im 28/28]
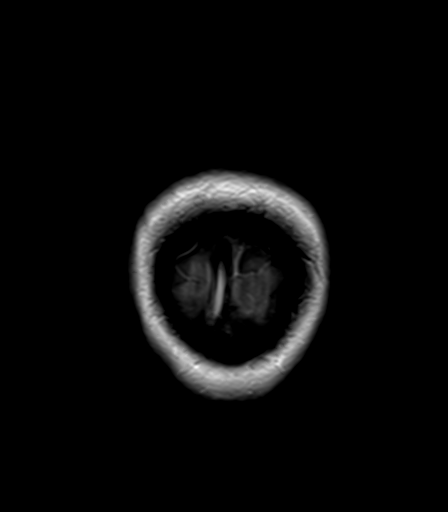

[38 of 48 positions shown; findings below may reference images not displayed]

FINDINGS: Brain: Cerebral volume within normal limits for patient age. No
focal parenchymal signal abnormality identified.

No abnormal foci of restricted diffusion to suggest acute or
subacute ischemia. Gray-white matter differentiation well
maintained. No encephalomalacia to suggest chronic infarction. No
foci of susceptibility artifact to suggest acute or chronic
intracranial hemorrhage.

No mass lesion, midline shift or mass effect. No hydrocephalus. No
extra-axial fluid collection.

Pituitary gland and suprasellar region are normal. Midline
structures intact and normal.

No abnormal enhancement.

Vascular: Major intracranial vascular flow voids well maintained and
normal in appearance.

Skull and upper cervical spine: Craniocervical junction normal.
Visualized upper cervical spine within normal limits. Bone marrow
signal intensity normal. No scalp soft tissue abnormality.

Sinuses/Orbits: Globes and orbital soft tissues within normal
limits.

Paranasal sinuses are clear. No mastoid effusion. Inner ear
structures normal.

Other: None.
IMPRESSION: Normal brain MRI.  No acute intracranial abnormality.

## 2021-06-13 MED ORDER — DULOXETINE HCL 30 MG PO CPEP
30.0000 mg | ORAL_CAPSULE | Freq: Every day | ORAL | Status: DC
  Administered 2021-06-13 – 2021-06-15 (×3): 30 mg via ORAL
  Filled 2021-06-13 (×4): qty 1

## 2021-06-13 MED ORDER — ONDANSETRON HCL 4 MG/2ML IJ SOLN
4.0000 mg | Freq: Four times a day (QID) | INTRAMUSCULAR | Status: DC | PRN
  Administered 2021-06-13 (×2): 4 mg via INTRAVENOUS
  Filled 2021-06-13 (×3): qty 2

## 2021-06-13 MED ORDER — GADOBUTROL 1 MMOL/ML IV SOLN
7.0000 mL | Freq: Once | INTRAVENOUS | Status: AC | PRN
  Administered 2021-06-13: 7 mL via INTRAVENOUS

## 2021-06-13 MED ORDER — SODIUM CHLORIDE 0.9 % IV SOLN
12.5000 mg | Freq: Four times a day (QID) | INTRAVENOUS | Status: DC | PRN
  Administered 2021-06-13 – 2021-06-15 (×8): 12.5 mg via INTRAVENOUS
  Filled 2021-06-13 (×9): qty 0.5

## 2021-06-13 MED ORDER — OXYCODONE HCL 5 MG PO TABS
10.0000 mg | ORAL_TABLET | Freq: Once | ORAL | Status: AC
  Administered 2021-06-13: 10 mg via ORAL
  Filled 2021-06-13: qty 2

## 2021-06-13 MED ORDER — SODIUM CHLORIDE 0.9 % IV SOLN
25.0000 mg | Freq: Four times a day (QID) | INTRAVENOUS | Status: DC | PRN
  Administered 2021-06-13: 25 mg via INTRAVENOUS
  Filled 2021-06-13: qty 1

## 2021-06-13 NOTE — Procedures (Signed)
Patient Name: [name]  MRN: [id number]  Epilepsy Attending: Charlsie Quest  Referring Physician/Provider: Dr Eliezer Bottom Date: 06/13/2021 Duration: 29.30 mins  Patient history: 30 year old female with recurrent syncope.  EEG to evaluate for seizures.  Level of alertness: Awake  AEDs during EEG study: None  Technical aspects: This EEG study was done with scalp electrodes positioned according to the 10-20 International system of electrode placement. Electrical activity was acquired at a sampling rate of 500Hz  and reviewed with a high frequency filter of 70Hz  and a low frequency filter of 1Hz . EEG data were recorded continuously and digitally stored.   Description: The posterior dominant rhythm consists of 9-10 Hz activity of moderate voltage (25-35 uV) seen predominantly in posterior head regions, symmetric and reactive to eye opening and eye closing.   Event button was pressed at 0927. Patient was noted to vomiting and suddenly fell back, bed.  Concomitant EEG before, during and after the event did not show any EEG changes suggest seizure.  Event button was pressed at 0934. Patient was laying in bed, not responding. Concomitant EEG before, during and after the event showed normal posterior dominant rhythm.  Patient was also noted to have generalized twitching during the EEG. Concomitant EEG before, during and after the event did not show any EEG changes suggest seizure.  Hyperventilation and photic stimulation were not performed.     IMPRESSION: This study is within normal limits. No seizures or epileptiform discharges were seen throughout the recording.  Multiple events were recorded as described above without concomitant EEG change.  These were nonepileptic events.  Abigail Harding 

## 2021-06-13 NOTE — Progress Notes (Addendum)
   Subjective:   Patient had several non-epileptic events throughout the night and this morning, lasting from 30 seconds to 1-2 minutes. Patient was seen at bedside during rounds this morning. She complains ongoing abdominal pain with nausea.  Patient and mother are concerned that episodes continue to prolong, and they are reassured with extensive negative workup done during hospital stay.   Objective:  Vital signs in last 24 hours: Vitals:   06/13/21 1144 06/13/21 2355 06/14/21 0132 06/14/21 0404  BP: (!) 107/56 117/72  (!) 90/44  Pulse: 65 67  (!) 52  Resp: 18 18    Temp: 98.3 F (36.8 C) 98 F (36.7 C)  98.2 F (36.8 C)  TempSrc:  Oral  Oral  SpO2: 98% 97%  99%  Weight:   74.3 kg    Constitutional: patient was alert, well-appearing, in no acute distress. MSK: normal bulk and tone Neurological: alert & oriented x 3 Cardiovascular: regular rate and rhythm, no m/r/g Pulmonary/Chest: normal work of breathing on room air, lungs clear to auscultation bilaterally Abdominal: soft, non-tender to palpation, non-distended Psych: patient appears anxious  Assessment/Plan:  Principal Problem:   Transient loss of consciousness Active Problems:   Chronic pelvic pain in female   Generalized anxiety disorder   Nonepileptic episode (HCC)   Nausea with vomiting   Syncope  Abigail Harding is a 30 y.o. female with a past medical history of endometriosis, PID, PCOS, GAD, and history of multiple non-epileptic events.   Non-epileptic events Generalized anxiety Pt with hx of recurrent non-epileptic events and transient LOC with extensive work-up since 2018, including negative EEG, Holter monitor and CT scans. Repeat EEG here does not show any EEG changes suggestive of seizures. Vitals and labs wnl, and normal brain MRI indicate no structural disease. On exam, these episodes are not consistent with syncope. Patient accepted psychiatric consultation offered today.       -psych consulted, and  started hydroxyzine 25 bid prn for anxiety and hydroxyzine 50 qhs for insomnia. Pt does not want to try increasing duloxetine dose at this time without talking to her endometriosis specialist. She is offered outpatient therapy and psychiatric resources as well.  -telemetry monitoring -Duloxetine 30mg  daily -Avoid benzo medications, patient declined other medication adjustments today   Elevated TSH Mildly elevated TSH 5.7 in patient with no hx of thyroid disorders. Free T4 mildly elevated at 1.5, and T3 wnl. Ddx would include pituitary adenoma, however unlikely given MRI negative.   Acute on chronic pelvic pain: Pt has a history of endometriosis with multiple cysts that are ruptured and has had pelvic pain in the past. Reported having abnormal vaginal discharge, and TTP over uterus in ED. CTA negative, wet prep with WBC. Differential includes ruptured cyst, endometriosis, or acute on chronic pelvic pain. Empirically treated for G/C d/t vaginal discharge and TTP over the uterus.  -Phenergan PRN -Dilaudid 1 mg IV q6h     , MD 06/14/2021, 6:14 AM Internal Medicine Resident, PGY-1 Pager: [phone] After 5pm on weekdays and 1pm on weekends: On Call pager [phone]

## 2021-06-13 NOTE — Evaluation (Signed)
Physical Therapy Evaluation and Discharge Patient Details Name: Abigail Harding MRN: [id number] DOB: [birth year] Today's Date: 06/13/2021   History of Present Illness  30 y.o. female  presented 06/12/21 with chief complaint of multiple syncopal episodes since 06/11/21.  The episodes are not postural. Work-up in the ED significant for negative orthostatics, negative pregnancy test, labs WNL except for mild hypocalcemia, unremarkable EKG, unremarkable CT head, CTA, CT abdomen pelvis. 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 PTA patient was having multiple syncopal episodes per day with occasional falls.  Pt currently requires minguard assist with ambulation for safety in case of syncopal episode. One syncopal episode observed during session while pt was seated in chair. She state, "Someone better catch me," as she fell backwards in chair and was unresponsive x 60 seconds. VSS and RN in to assess. When she aroused, she was quickly back to her baseline and wanting to get back to bed. No skilled needs identified at this time. Patient is staying at home with her mother for improved safety if she continues to have syncopal episodes.  Patient is discharged from PT.      Follow Up Recommendations No PT follow up    Equipment Recommendations  None recommended by PT    Recommendations for Other Services       Precautions / Restrictions Precautions Precautions: Fall;Other (comment) Precaution Comments: Pt with syncopal episodes, +2 for safety Restrictions Weight Bearing Restrictions: No      Mobility  Bed Mobility Overal bed mobility: Independent             General bed mobility comments: supine to sit and later sit to supine    Transfers Overall transfer  level: Needs assistance Equipment used: Rolling walker (2 wheeled) Transfers: Sit to/from Stand Sit to Stand: Min guard;+2 safety/equipment         General transfer comment: +2 for safety due to frequent syncopal episodes in past several days  Ambulation/Gait Ambulation/Gait assistance: Min guard Gait Distance (Feet): 90 Feet Assistive device: Rolling walker (2 wheeled) Gait Pattern/deviations: Step-through pattern;Decreased stride length Gait velocity: decr   General Gait Details: incr reliance on bil UEs via RW; noted trembling with pt reporting due to abd pain; no syncope while walking  Stairs            Wheelchair Mobility    Modified Rankin (Stroke Patients Only)       Balance Overall balance assessment: Modified Independent                                           Pertinent Vitals/Pain Pain Assessment: Faces Faces Pain Scale: Hurts worst Pain Location: abdomen, back Pain Descriptors / Indicators: Sharp;Discomfort;Guarding;Operative site guarding Pain Intervention(s): Limited activity within patient's tolerance;Monitored during session    Home Living Family/patient expects to be discharged to:: Private residence Living Arrangements: Parent Available Help at Discharge: Family Type of Home: House Home Access: Stairs to enter Entrance Stairs-Rails: None Entrance Stairs-Number of Steps: 2 Home Layout: One level Home Equipment: Environmental consultant - 2 wheels;Bedside commode;Other (comment) (back brace)      Prior Function Level of Independence: Independent         Comments: was no longer using  RW after back surgery     Hand Dominance        Extremity/Trunk Assessment   Upper Extremity Assessment Upper Extremity Assessment: Defer to OT evaluation    Lower Extremity Assessment Lower Extremity Assessment: LLE deficits/detail LLE Deficits / Details: reports numbness down outside of LLE to left 4-5th toes since her back surgery in June     Cervical / Trunk Assessment Cervical / Trunk Assessment: Other exceptions Cervical / Trunk Exceptions: s/p lumbar spinal surgery  Communication   Communication: No difficulties  Cognition Arousal/Alertness: Awake/alert Behavior During Therapy: WFL for tasks assessed/performed Overall Cognitive Status: Impaired/Different from baseline Area of Impairment: Safety/judgement                         Safety/Judgement: Decreased awareness of safety;Decreased awareness of deficits     General Comments: Pt with decreased awareness of safety, initially requesting not to wear a gait belt and reporting that she does not need assistance with mobility. However, before syncopal episode reporting "catch me". Otherwise, cognition appears to be intact with pt recalling medication names and new information shared with her. Pt initially questioning need for PT and OT but agreeable to session with education.      General Comments General comments (skin integrity, edema, etc.): Pt with one syncopal episode while seated in recliner after washing her hair in sink.  Nursing notified immediately. BP: 130/64 (83); HR 60 BPM; SpO2 100% on RA    Exercises     Assessment/Plan    PT Assessment Patent does not need any further PT services  PT Problem List         PT Treatment Interventions      PT Goals (Current goals can be found in the Care Plan section)  Acute Rehab PT Goals Patient Stated Goal: find cause of syncopal spells; go home PT Goal Formulation: All assessment and education complete, DC therapy    Frequency     Barriers to discharge        Co-evaluation PT/OT/SLP Co-Evaluation/Treatment: Yes Reason for Co-Treatment: For patient/therapist safety;Complexity of the patient's impairments (multi-system involvement) PT goals addressed during session: Mobility/safety with mobility         AM-PAC PT "6 Clicks" Mobility  Outcome Measure Help needed turning from your back to your  side while in a flat bed without using bedrails?: None Help needed moving from lying on your back to sitting on the side of a flat bed without using bedrails?: None Help needed moving to and from a bed to a chair (including a wheelchair)?: None Help needed standing up from a chair using your arms (e.g., wheelchair or bedside chair)?: None Help needed to walk in hospital room?: A Little Help needed climbing 3-5 steps with a railing? : A Little 6 Click Score: 22    End of Session Equipment Utilized During Treatment: Gait belt Activity Tolerance: Treatment limited secondary to medical complications (Comment) (syncopal episode after walking and washing her hair) Patient left: in bed;with call bell/phone within reach;with bed alarm set;with family/visitor present Nurse Communication: Other (comment) (syncopal episode; VSS) PT Visit Diagnosis: Difficulty in walking, not elsewhere classified (R26.2)    Time: 2482-5003 PT Time Calculation (min) (ACUTE ONLY): 34 min   Charges:   PT Evaluation $PT Eval Low Complexity: 1 Low           Jerolyn Center, PT Pager [phone]   Zena Amos 06/13/2021, 12:46 PM

## 2021-06-13 NOTE — Progress Notes (Signed)
This nurse was at the pts bedside when pt had a witnessed syncopal episode after vomiting last approx 15 seconds, mother stated it was a much quicker recovery time. What went limb in both arms and legs. However hands remained clutched tightly. This nurse was talking to pts mom when pt experienced another syncopal episode lasting approx 30 seconds. Pt stated she was still nauseous and requested sprite to drink. Will ctm

## 2021-06-13 NOTE — Progress Notes (Addendum)
   Subjective:   Overnight, pt had a couple syncopal episode and regained consciousness after 30 seconds.   Patient was seen at bedside during rounds this morning. She had non-epileptic loss of of consciousness ongoing for about 5 minutes prior to returning to consciousness and interacting with the team. Pt reports feeling concerned about these episodes. Pt denies any trauma during these events. No other complains or concerns at this time.   Objective:  Vital signs in last 24 hours: Vitals:   06/12/21 2200 06/12/21 2230 06/13/21 0115 06/13/21 0358  BP: 112/62 120/61  136/84  Pulse: 64 77  83  Resp: 13 16  20   Temp:  98.2 F (36.8 C)    TempSrc:  Oral    SpO2: 97% 100% 100% 100%  Weight:  74.3 kg     Constitutional: after brief episode of non-epileptic loss of consciousness, patient was alert, well-appearing, in no acute distress. MSK: normal bulk and tone Neurological: alert & oriented x 3 Psych: patient appears anxious   Assessment/Plan:  Principal Problem:   Transient loss of consciousness Active Problems:   Chronic pelvic pain in female   Generalized anxiety disorder   Nonepileptic episode (HCC)   Nausea with vomiting   Syncope  Abigail Harding is a 30 y.o. female with a past medical history of endometriosis, PID, PCOS, anxiety, and history of multiple loss of consciousness episodes.   Non-epileptic events Generalized anxiety Pt with hx of recurrent non-epileptic events and transient loss of of consciousness for which she has undergone extensive work-up since 2018, including negative EEG, Holter monitor and CT scans. Today, repeat EEG before, during and after the events do not show any EEG changes suggestive of seizures. Vitals and labs wnl, and normal brain MRI indicate no structural disease. On exam, these episodes are not consistent with syncope.     -d/c neuro checks -telemetry monitoring -offer psychiatry consultation again tomorrow if episodes do not  improve -Duloxetine 30mg  daily -Avoid benzo medications, patient declined other medication adjustments today   Elevated TSH TSH 5.7. Pt has no hx of thyroid disorders -T3 and free T4 ordered  Acute on chronic pelvic pain: Pt has a history of endometriosis with multiple cysts that are ruptured and has had pelvic pain in the past. Also reports having abnormal vaginal discharge. Per ED notes, had TTP over uterus. CTA negative, wet prep with WBC. Differential includes ruptured cyst, endometriosis, or acute on chronic pelvic pain. Empirically treated for G/C d/t vaginal discharge and TTP over the uterus.  - Phenergan PRN    2019, MD 06/13/2021, 6:48 AM Internal Medicine Resident, PGY-1 Pager: [phone] After 5pm on weekdays and 1pm on weekends: On Call pager [phone]

## 2021-06-13 NOTE — Evaluation (Signed)
Occupational Therapy Evaluation Patient Details Name: Abigail Harding MRN: [id number] DOB: [birth year] Today's Date: 06/13/2021    History of Present Illness 30 y.o. female  presented 06/12/21 with chief complaint of multiple syncopal episodes since 06/11/21.  The episodes are not postural. Work-up in the ED significant for negative orthostatics, negative pregnancy test, labs WNL except for mild hypocalcemia, unremarkable EKG, unremarkable CT head, CTA, CT abdomen pelvis. 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 Pt evaluation porformed in collaboration with PT to optimize safety due to pt's recent frequent syncopal episodes. Currently, pt performing UB ADLs with supervision and LB ADLs with [employer]. Pt performing functional mobility with Min Guard A +2 for safety; noting heavily reliant on RW due to pain. No skilled OT needs identified at this time. Recommend discharge home with no follow up OT. Re-consult of change in status. OT to sign off.   Pt  reporting "someone better catch me" and with one syncopal episode while seated in recliner at end of session. Nursing notified immediately and present to assess. BP: 130/64 (83); HR 60 BPM; SpO2 100% on RA. Pt unresponsive x60 seconds. When aroused, quickly back to baseline with no apparent confusion, and ready to return to bed.      Follow Up Recommendations  No OT follow up    Equipment Recommendations  Tub/shower seat    Recommendations for Other Services       Precautions / Restrictions Precautions Precautions: Fall;Other (comment) Precaution Comments: Pt with syncopal episodes, +2 for safety Restrictions Weight Bearing Restrictions: No      Mobility Bed Mobility Overal bed mobility: Independent              General bed mobility comments: supine to sit and later sit to supine    Transfers Overall transfer level: Needs assistance Equipment used: Rolling walker (2 wheeled) Transfers: Sit to/from Stand Sit to Stand: Min guard;+2 safety/equipment         General transfer comment: +2 for safety due to frequent syncopal episodes in past several days    Balance Overall balance assessment: Modified Independent                                         ADL either performed or assessed with clinical judgement   ADL Overall ADL's : Needs assistance/impaired Eating/Feeding: Set up;Sitting   Grooming: Set up;Sitting;Wash/dry face;Brushing hair Grooming Details (indicate cue type and reason): Pt washing hair in sink during session with supervision for safety. Upper Body Bathing: Supervision/ safety;Sitting   Lower Body Bathing: Min guard;Sit to/from stand   Upper Body Dressing : Supervision/safety;Sitting   Lower Body Dressing: Min guard;Sit to/from stand   Toilet Transfer: Min guard;Ambulation;RW Toilet Transfer Details (indicate cue type and reason): Pt ambulating to toilet with RW. Min Guard A for safety. Toileting- Architect and Hygiene: Min guard;Sit to/from stand       Functional mobility during ADLs: Min guard;Rolling walker General ADL Comments: Pt perfroming UB ADL with close supervision and LB ADL with Min Guard A for safety. Pt performing functional mobility with Min Guard A using RW.     Vision Baseline Vision/History: Wears glasses Wears Glasses: At all times Patient Visual  Report: No change from baseline Vision Assessment?: No apparent visual deficits Additional Comments: Pt scanning to find items needed for grooming and for socks prior to functional mobility. Reading medication names on phone     Perception     Praxis      Pertinent Vitals/Pain Pain Assessment: Faces Faces Pain Scale: Hurts worst Pain Location: abdomen,  back Pain Descriptors / Indicators: Sharp;Discomfort;Guarding;Operative site guarding Pain Intervention(s): Limited activity within patient's tolerance;Monitored during session     Hand Dominance     Extremity/Trunk Assessment Upper Extremity Assessment Upper Extremity Assessment: Overall WFL for tasks assessed   Lower Extremity Assessment Lower Extremity Assessment: Defer to PT evaluation LLE Deficits / Details: reports numbness down outside of LLE to left 4-5th toes since her back surgery in June   Cervical / Trunk Assessment Cervical / Trunk Assessment: Other exceptions Cervical / Trunk Exceptions: s/p lumbar spinal surgery   Communication Communication Communication: No difficulties   Cognition Arousal/Alertness: Awake/alert Behavior During Therapy: WFL for tasks assessed/performed Overall Cognitive Status: Impaired/Different from baseline Area of Impairment: Safety/judgement                         Safety/Judgement: Decreased awareness of safety;Decreased awareness of deficits     General Comments: Pt with decreased awareness of safety, initially requesting not to wear a gait belt and reporting that she does not need assistance with mobility. However, before syncopal episode reporting "catch me". Otherwise, cognition appears to be intact with pt recalling medication names and new information shared with her. Pt initially questioning need for PT and OT but agreeable to session with education.   General Comments  Pt with one syncopal episode while seated in recliner after washing her hair in sink. Nursing notified immediately. BP: 130/64 (83); HR 60 BPM; SpO2 100% on RA    Exercises     Shoulder Instructions      Home Living Family/patient expects to be discharged to:: Private residence Living Arrangements: Parent Available Help at Discharge: Family Type of Home: House Home Access: Stairs to enter Secretary/administrator of Steps: 2 Entrance Stairs-Rails:  None Home Layout: One level     Bathroom Shower/Tub: Tub/shower unit         Home Equipment: Environmental consultant - 2 wheels;Bedside commode;Other (comment) (back brace)          Prior Functioning/Environment Level of Independence: Independent        Comments: was no longer using RW after back surgery        OT Problem List: Decreased activity tolerance;Pain;Decreased safety awareness      OT Treatment/Interventions:      OT Goals(Current goals can be found in the care plan section) Acute Rehab OT Goals Patient Stated Goal: find cause of syncopal spells; go home OT Goal Formulation: With patient/family  OT Frequency:     Barriers to D/C:            Co-evaluation PT/OT/SLP Co-Evaluation/Treatment: Yes Reason for Co-Treatment: For patient/therapist safety;To address functional/ADL transfers PT goals addressed during session: Mobility/safety with mobility;Balance;Proper use of DME OT goals addressed during session: ADL's and self-care;Proper use of Adaptive equipment and DME      AM-PAC OT "6 Clicks" Daily Activity     Outcome Measure Help from another person eating meals?: A Little Help from another person taking care of personal grooming?: A Little Help from another person toileting, which includes using toliet, bedpan, or urinal?: A Little Help from another person bathing (including  washing, rinsing, drying)?: A Little Help from another person to put on and taking off regular upper body clothing?: A Little Help from another person to put on and taking off regular lower body clothing?: A Little 6 Click Score: 18   End of Session Equipment Utilized During Treatment: Gait belt;Rolling walker Nurse Communication: Mobility status  Activity Tolerance: Patient tolerated treatment well Patient left: in bed;with call bell/phone within reach;with bed alarm set  OT Visit Diagnosis: Unsteadiness on feet (R26.81);Muscle weakness (generalized) (M62.81);Pain Pain - part of body:   (abdomen, back, LLE)                Time: 1791-5056 OT Time Calculation (min): 34 min Charges:  OT General Charges $OT Visit: 1 Visit OT Evaluation $OT Eval Moderate Complexity: 1 [address], OTDS   Ladene Artist 06/13/2021, 1:01 PM

## 2021-06-13 NOTE — Progress Notes (Signed)
EEG complete - results pending 

## 2021-06-13 NOTE — Progress Notes (Signed)
Pt c/o of n/v at this time, pt heard gagging but emesis bag remains empty and dry, pt c/o being "hot and sweaty all of a sudden" after being "in a deep sleep," pt presents with dry skin, warm to the touch and an oral temp of 96.24F, HR 77, BP 136 sys, 100% O2 sat,   phenergan given for nausea,  Pt then lays face down on the bed, breathing profoundly, with a pulse but unresponsive to touch or voice, consistent with syncopal episodes described in the ED, after around 30 seconds pt became responsive again, this is the third witnessed episode since pt arrived at 2W unit.    pt ambulates to the bathroom with stand by assist, pt then returns to the bed  Continuing to monitor and assess pt

## 2021-06-14 DIAGNOSIS — R55 Syncope and collapse: Secondary | ICD-10-CM | POA: Diagnosis not present

## 2021-06-14 DIAGNOSIS — F411 Generalized anxiety disorder: Secondary | ICD-10-CM

## 2021-06-14 LAB — CBC
HCT: 38.3 % (ref 36.0–46.0)
Hemoglobin: 13 g/dL (ref 12.0–15.0)
MCH: 30.7 pg (ref 26.0–34.0)
MCHC: 33.9 g/dL (ref 30.0–36.0)
MCV: 90.5 fL (ref 80.0–100.0)
Platelets: 161 10*3/uL (ref 150–400)
RBC: 4.23 MIL/uL (ref 3.87–5.11)
RDW: 11.9 % (ref 11.5–15.5)
WBC: 6.4 10*3/uL (ref 4.0–10.5)
nRBC: 0 % (ref 0.0–0.2)

## 2021-06-14 LAB — BASIC METABOLIC PANEL
Anion gap: 11 (ref 5–15)
BUN: <5 mg/dL — ABNORMAL LOW (ref 6–20)
CO2: 27 mmol/L (ref 22–32)
Calcium: 9.7 mg/dL (ref 8.9–10.3)
Chloride: 101 mmol/L (ref 98–111)
Creatinine, Ser: 0.86 mg/dL (ref 0.44–1.00)
GFR, Estimated: >60 mL/min (ref >60)
Glucose, Bld: 100 mg/dL — ABNORMAL HIGH (ref 70–99)
Potassium: 3.9 mmol/L (ref 3.5–5.1)
Sodium: 139 mmol/L (ref 135–145)

## 2021-06-14 LAB — T3: T3, Total: 166 ng/dL (ref 71–180)

## 2021-06-14 MED ORDER — HYDROXYZINE HCL 25 MG PO TABS
25.0000 mg | ORAL_TABLET | Freq: Two times a day (BID) | ORAL | Status: DC | PRN
  Administered 2021-06-14 – 2021-06-15 (×2): 25 mg via ORAL
  Filled 2021-06-14 (×2): qty 1

## 2021-06-14 MED ORDER — HYDROXYZINE HCL 25 MG PO TABS
50.0000 mg | ORAL_TABLET | Freq: Every day | ORAL | Status: DC
  Administered 2021-06-14: 50 mg via ORAL
  Filled 2021-06-14: qty 2

## 2021-06-14 MED ORDER — HYDROMORPHONE HCL 1 MG/ML IJ SOLN
1.0000 mg | Freq: Four times a day (QID) | INTRAMUSCULAR | Status: DC | PRN
Start: 2021-06-14 — End: 2021-06-15
  Administered 2021-06-14 – 2021-06-15 (×5): 1 mg via INTRAVENOUS
  Filled 2021-06-14 (×5): qty 1

## 2021-06-14 NOTE — Progress Notes (Addendum)
Patient is a 30 year old woman hospitalized on 06/12/21 for concerns of "syncope-like" episodes of transient loss of consciousness dating back to 2018. Due to nausea and associated vomiting, the patient had been receiving all medications parenterally. Oral duloxetine/Cymbalta was recently ordered and administered indicating that the patient may be able to tolerate medications by mouth. For VTE prophylaxis, the patient is receiving enoxaparin/Lovenox 40mg  subcutaneously every 24 hours. Once the patient is tolerating medications by mouth without nausea and vomiting, switching to rivaroxaban/Xarelto 10mg  by mouth once daily may be considered. The patient is also receiving promethazine/Phenergan 12.5mg  IV every 6 hours as needed and ondansetron/Zofran 4mg  IV every 6 hours as needed. We could consider switching these to oral formulations of promethazine/Phenergan and ondansetron/Zofran once the patient is able to tolerate medications by mouth. Have reviewed note by pharmacy student.

## 2021-06-14 NOTE — TOC Initial Note (Signed)
Transition of Care [facility]) - Initial/Assessment Note    Patient Details  Name: Abigail Harding MRN: [id number] Date of Birth: 01/04/1991  Transition of Care [facility] CM/SW Contact:    Joanne Chars, LCSW Phone Number: 06/14/2021, 3:57 PM  Clinical Narrative:    CSW met with pt and mother Butch Penny regarding request for DME.  Permission given to speak with mother present.  Pt confirmed that she needs shower chair, no PT recommendations for follow up, however, pt is already scheduled for outpt PT related to her recent back surgery.  PCP in place. Current DME in home: walker, bedside commode.  Pt is not vaccinated for covid and does not want to be.                 Expected Discharge Plan: Home/Self Care Barriers to Discharge: No Barriers Identified   Patient Goals and CMS Choice Patient states their goals for this hospitalization and ongoing recovery are:: "be able to pick my daughter up again" CMS Medicare.gov Compare Post Acute Care list provided to::  (NA)    Expected Discharge Plan and Services Expected Discharge Plan: Home/Self Care     Post Acute Care Choice: Durable Medical Equipment (shower chair) Living arrangements for the past 2 months: Single Family Home                           HH Arranged: NA HH Agency: NA        Prior Living Arrangements/Services Living arrangements for the past 2 months: Single Family Home Lives with:: Minor Children, Parents Patient language and need for interpreter reviewed:: Yes Do you feel safe going back to the place where you live?: Yes      Need for Family Participation in Patient Care: No (Comment) Care giver support system in place?: Yes (comment) Current home services: Other (comment) (none) Criminal Activity/Legal Involvement Pertinent to Current Situation/Hospitalization: No - Comment as needed  Activities of Daily Living      Permission Sought/Granted Permission sought to share information with : Family  Supports Permission granted to share information with : Yes, Verbal Permission Granted  Share Information with NAME: mother Butch Penny           Emotional Assessment Appearance:: Appears stated age Attitude/Demeanor/Rapport: Engaged Affect (typically observed): Appropriate, Pleasant Orientation: : Oriented to Self, Oriented to Place, Oriented to  Time, Oriented to Situation Alcohol / Substance Use: Not Applicable Psych Involvement: Yes (comment)  Admission diagnosis:  Vaginal discharge [N89.8] Syncope [R55] Pain [R52] Generalized abdominal pain [R10.84] Syncope, unspecified syncope type [R55] Patient Active Problem List   Diagnosis Date Noted   Nonepileptic episode ([place]) 06/13/2021   Nausea with vomiting 06/13/2021   Syncope 06/13/2021   Health care maintenance 04/12/2018   Endometriosis determined by laparoscopy 01/02/2017   Transient loss of consciousness 12/31/2016   Headache 12/21/2016   Right lower quadrant abdominal pain 10/31/2016   Family history of endometriosis in first degree relative 10/31/2016   Round ligament pain 10/31/2016   Dysmenorrhea 10/31/2016   Pelvic pain 10/31/2016   Chronic pelvic pain in female 03/16/2016   Generalized anxiety disorder 03/02/2016   Oligo-ovulation 11/17/2015   PCP:  [facility]:   [id number] [place], [place] [address]Phone: [phone]:: [phone]    Social Determinants of Health (SDOH) Interventions    Readmission Risk Interventions No flowsheet data found.

## 2021-06-14 NOTE — Discharge Summary (Addendum)
Name: Abigail Harding MRN: [id number] DOB: Sep 27, 1991 30 y.o. PCP: [facility], [facility]  Date of Admission: 06/12/2021  9:43 AM Date of Discharge:  06/15/2021 Attending Physician: Dr. Oswaldo Done  DISCHARGE DIAGNOSIS:  Primary Problem: Transient loss of consciousness   Hospital Problems: Principal Problem:   Transient loss of consciousness Active Problems:   Chronic pelvic pain in female   Generalized anxiety disorder   Nonepileptic episode (HCC)   Nausea with vomiting   Syncope    DISCHARGE MEDICATIONS:    DULoxetine 30 MG capsule Commonly known as: CYMBALTA Start taking on: June 16, 2021 Take 1 capsule (30 mg total) by mouth daily. What changed: medication strength how much to take Signed WU:JWJXB Allena Katz, MD           * hydrOXYzine 50 MG tablet Commonly known as: ATARAX/VISTARIL Take 1 tablet (50 mg total) by mouth at bedtime. Signed JY:NWGNF Allena Katz, MD            * hydrOXYzine 25 MG tablet Commonly known as: ATARAX/VISTARIL Take 1 tablet (25 mg total) by mouth 2 (two) times daily as needed for itching or anxiety. Signed AO:ZHYQM Allena Katz, MD          promethazine 12.5 MG tablet Commonly known as: PHENERGAN Take 12.5 mg by mouth every 6 (six) hours as needed for nausea or vomiting.          senna-docusate 8.6-50 MG tablet Commonly known as: Senokot-S Take 2 tablets by mouth daily. Signed VH:QIONG Allena Katz, MD           DISPOSITION AND FOLLOW-UP:   AbigailHydeia BLYTHE Harding was discharged from [facility] in stable condition. At the hospital follow up visit please address:  Follow-up Recommendations: Consults: none Labs: none  Studies: none  Medications: Talk with endometriosis specialist about considering increasing duloxetine dose to 60 mg.   Follow-up Appointments:   HOSPITAL COURSE:  Patient Summary:  Principal Problem:    Non-epileptic events and transient loss of consciousness- Pt with hx of recurrent non-epileptic events and  transient loss of of consciousness for which she has undergone extensive work-up since 2018, including negative EEG, Holter monitor and CT scans. Repeat EEG before, during and after the events do not show any EEG changes suggestive of seizures. Vitals and labs within normal limits, and normal brain MRI indicate no structural disease. On examinations, these episodes are not consistent with syncope, which is reassuring. Pt was monitored on telemetry, had frequent neuro checks, and provided psychiatric consultation. Patient should coordinate visits with her providers to ensure medical conditions continue to be appropriately managed, and her GAD continues to be treated.   Active Problems:    Chronic pelvic pain and nausea with vomitting- Pt has a history of endometriosis with multiple cysts that are ruptured and has had pelvic pain in the past. Also reports having abnormal vaginal discharge and had tenderness to palpation over uterus in the ED. CTA was negative, wet prep with WBC. Differential includes ruptured cyst, endometriosis, or acute on chronic pelvic pain. Empirically treated for G/C d/t vaginal discharge and TTP over the uterus. Pain was managed with dilaudid.     Generalized anxiety disorder- managed with duloxetine  daily. Psychiatry was consulted, and hydroxyzine 25 bid prn was started for anxiety, and hydroxyzine 50 qhs for insomnia. Pt does not want to try increasing duloxetine dose at this time without talking to her endometriosis specialist. She is offered outpatient therapy and psychiatric resources as well. Please make an appointment  with endometriosis specialist to discuss this option.   DISCHARGE INSTRUCTIONS:   Ms Lynett Fishaige Harding is a 30 y.o. female with a past medical history of endometriosis, PID, PCOS, GAD, and history of multiple non-epileptic events, admitted for workup of these non-epileptic events resulting in transient loss of consciousness. She should continue to take her  medications as prescribed, and would benefit from coordinated visits with her providers to ensure medical conditions continue to be appropriately managed. Additionally, continue to make arrangements to increase support at home.  SUBJECTIVE:  Ms. Tye Savoyaige M Harding feels like she is improving since admission. She continues to complain of abdominal pain which is non-improving since admission. She has mentioned that she will make an appointment with her OB/GYN provider, and also her endometriosis specialist. She is aware that she is having non-epileptic events and understands that she should continue to follow up with her providers to manage her medical conditions, including her anxiety.   Discharge Vitals:   BP 104/68 (BP Location: Right Arm)   Pulse 61   Temp 98.3 F (36.8 C)   Resp 15   Wt 74.3 kg   SpO2 100%   BMI 28.12 kg/m   OBJECTIVE:  Constitutional: patient was alert, well-appearing, in no acute distress. MSK: normal bulk and tone Neurological: alert & oriented x 3 Cardiovascular: regular rate and rhythm, no m/r/g Pulmonary/Chest: normal work of breathing on room air, lungs clear to auscultation bilaterally Abdominal: soft, tender to palpation, non-distended Psych: patient appears anxious  Pertinent Labs, Studies, and Procedures:  CBC Latest Ref Rng & Units 06/14/2021 06/13/2021 06/12/2021  WBC 4.0 - 10.5 K/uL 6.4 6.2 7.0  Hemoglobin 12.0 - 15.0 g/dL 16.113.0 09.612.6 04.513.4  Hematocrit 36.0 - 46.0 % 38.3 37.5 39.6  Platelets 150 - 400 K/uL 161 156 181    CMP Latest Ref Rng & Units 06/14/2021 06/13/2021 06/12/2021  Glucose 70 - 99 mg/dL 409(W100(H) 97 119(J110(H)  BUN 6 - 20 mg/dL <4(N<5(L) <8(G<5(L) 10  Creatinine 0.44 - 1.00 mg/dL 9.560.86 2.130.81 0.860.95  Sodium 135 - 145 mmol/L 139 140 139  Potassium 3.5 - 5.1 mmol/L 3.9 3.7 3.8  Chloride 98 - 111 mmol/L 101 105 108  CO2 22 - 32 mmol/L 27 29 24   Calcium 8.9 - 10.3 mg/dL 9.7 8.9 5.7(Q8.7(L)  Total Protein 6.5 - 8.1 g/dL - - 6.5  Total Bilirubin 0.3 - 1.2 mg/dL - - 0.8   Alkaline Phos 38 - 126 U/L - - 34(L)  AST 15 - 41 U/L - - 18  ALT 0 - 44 U/L - - 10    CT Head Wo Contrast  Result Date: 06/12/2021 CLINICAL DATA:  Trauma. Focal neuro findings. Two syncopal episodes. Hit head on book case. EXAM: CT HEAD WITHOUT CONTRAST TECHNIQUE: Contiguous axial images were obtained from the base of the skull through the vertex without intravenous contrast. COMPARISON:  December 21, 2016 FINDINGS: Brain: No evidence of acute infarction, hemorrhage, hydrocephalus, extra-axial collection or mass lesion/mass effect. Vascular: No hyperdense vessel or unexpected calcification. Skull: Normal. Negative for fracture or focal lesion. Sinuses/Orbits: No acute finding. Other: None. IMPRESSION: No acute intracranial abnormalities identified. Electronically Signed   By: Gerome Samavid  Williams III M.D   On: 06/12/2021 12:24   CT Angio Chest PE W and/or Wo Contrast  Result Date: 06/12/2021 CLINICAL DATA:  Two syncopal episodes, acute abdominal pain with nausea and diarrhea, recent fall EXAM: CT ANGIOGRAPHY CHEST CT ABDOMEN AND PELVIS WITH CONTRAST TECHNIQUE: Multidetector CT imaging of the chest was performed  using the standard protocol during bolus administration of intravenous contrast. Multiplanar CT image reconstructions and MIPs were obtained to evaluate the vascular anatomy. Multidetector CT imaging of the abdomen and pelvis was performed using the standard protocol during bolus administration of intravenous contrast. CONTRAST:  OMNIPAQUE IOHEXOL 350 MG/ML SOLN COMPARISON:  12/01/2016 FINDINGS: CTA CHEST FINDINGS Cardiovascular: Pulmonary arteries are normal in caliber and patent. Negative for significant pulmonary embolus or filling defect by CTA. Thoracic aorta intact. No aneurysm or dissection. Negative for mediastinal hemorrhage or hematoma. Normal heart size. No pericardial effusion. Central venous structures are patent.  No veno-occlusive process. Mediastinum/Nodes: No enlarged  mediastinal, hilar, or axillary lymph nodes. Thyroid gland, trachea, and esophagus demonstrate no significant findings. Lungs/Pleura: Lungs are clear. No pleural effusion or pneumothorax. Musculoskeletal: No chest wall abnormality. No acute or significant osseous findings. Review of the MIP images confirms the above findings. CT ABDOMEN and PELVIS FINDINGS Hepatobiliary: Focal fatty infiltration of liver along falciform ligament. No other significant abnormality. Common bile nondilated. Gallbladder nondistended. Pancreas: Unremarkable. No pancreatic ductal dilatation or surrounding inflammatory changes. Spleen: Normal in size without focal abnormality. Adrenals/Urinary Tract: Adrenal glands are unremarkable. Kidneys are normal, without renal calculi, focal lesion, or hydronephrosis. Bladder is unremarkable. Stomach/Bowel: negative for bowel obstruction, significant dilatation, ileus, or free air. No free fluid, fluid collection, hemorrhage, hematoma, abscess or ascites. Appendix not visualized with certainty. No acute inflammatory process. Vascular/Lymphatic: No significant vascular findings are present. No enlarged abdominal or pelvic lymph nodes. Reproductive: Uterus and bilateral adnexa are unremarkable. Other: No abdominal wall hernia or abnormality. No abdominopelvic ascites. Musculoskeletal: No acute or significant osseous findings. Review of the MIP images confirms the above findings. IMPRESSION: Negative for significant acute pulmonary embolus by CTA. No other acute intrathoracic finding. No acute intra-abdominal or pelvic finding. Electronically Signed   By: Judie Petit.  Shick M.D.   On: 06/12/2021 13:24   MR BRAIN W WO CONTRAST  Result Date: 06/13/2021 CLINICAL DATA:  Initial evaluation for neuro deficit, stroke suspected. EXAM: MRI HEAD WITHOUT AND WITH CONTRAST TECHNIQUE: Multiplanar, multiecho pulse sequences of the brain and surrounding structures were obtained without and with intravenous contrast.  CONTRAST:  63mL GADAVIST GADOBUTROL 1 MMOL/ML IV SOLN COMPARISON:  CT from 06/12/2020. FINDINGS: Brain: Cerebral volume within normal limits for patient age. No focal parenchymal signal abnormality identified. No abnormal foci of restricted diffusion to suggest acute or subacute ischemia. Gray-white matter differentiation well maintained. No encephalomalacia to suggest chronic infarction. No foci of susceptibility artifact to suggest acute or chronic intracranial hemorrhage. No mass lesion, midline shift or mass effect. No hydrocephalus. No extra-axial fluid collection. Pituitary gland and suprasellar region are normal. Midline structures intact and normal. No abnormal enhancement. Vascular: Major intracranial vascular flow voids well maintained and normal in appearance. Skull and upper cervical spine: Craniocervical junction normal. Visualized upper cervical spine within normal limits. Bone marrow signal intensity normal. No scalp soft tissue abnormality. Sinuses/Orbits: Globes and orbital soft tissues within normal limits. Paranasal sinuses are clear. No mastoid effusion. Inner ear structures normal. Other: None. IMPRESSION: Normal brain MRI.  No acute intracranial abnormality. Electronically Signed   By: Rise Mu M.D.   On: 06/13/2021 01:12   CT ABDOMEN PELVIS W CONTRAST  Result Date: 06/12/2021 CLINICAL DATA:  Two syncopal episodes, acute abdominal pain with nausea and diarrhea, recent fall EXAM: CT ANGIOGRAPHY CHEST CT ABDOMEN AND PELVIS WITH CONTRAST TECHNIQUE: Multidetector CT imaging of the chest was performed using the standard protocol during bolus administration of intravenous  contrast. Multiplanar CT image reconstructions and MIPs were obtained to evaluate the vascular anatomy. Multidetector CT imaging of the abdomen and pelvis was performed using the standard protocol during bolus administration of intravenous contrast. CONTRAST:  OMNIPAQUE IOHEXOL 350 MG/ML SOLN COMPARISON:   12/01/2016 FINDINGS: CTA CHEST FINDINGS Cardiovascular: Pulmonary arteries are normal in caliber and patent. Negative for significant pulmonary embolus or filling defect by CTA. Thoracic aorta intact. No aneurysm or dissection. Negative for mediastinal hemorrhage or hematoma. Normal heart size. No pericardial effusion. Central venous structures are patent.  No veno-occlusive process. Mediastinum/Nodes: No enlarged mediastinal, hilar, or axillary lymph nodes. Thyroid gland, trachea, and esophagus demonstrate no significant findings. Lungs/Pleura: Lungs are clear. No pleural effusion or pneumothorax. Musculoskeletal: No chest wall abnormality. No acute or significant osseous findings. Review of the MIP images confirms the above findings. CT ABDOMEN and PELVIS FINDINGS Hepatobiliary: Focal fatty infiltration of liver along falciform ligament. No other significant abnormality. Common bile nondilated. Gallbladder nondistended. Pancreas: Unremarkable. No pancreatic ductal dilatation or surrounding inflammatory changes. Spleen: Normal in size without focal abnormality. Adrenals/Urinary Tract: Adrenal glands are unremarkable. Kidneys are normal, without renal calculi, focal lesion, or hydronephrosis. Bladder is unremarkable. Stomach/Bowel: negative for bowel obstruction, significant dilatation, ileus, or free air. No free fluid, fluid collection, hemorrhage, hematoma, abscess or ascites. Appendix not visualized with certainty. No acute inflammatory process. Vascular/Lymphatic: No significant vascular findings are present. No enlarged abdominal or pelvic lymph nodes. Reproductive: Uterus and bilateral adnexa are unremarkable. Other: No abdominal wall hernia or abnormality. No abdominopelvic ascites. Musculoskeletal: No acute or significant osseous findings. Review of the MIP images confirms the above findings. IMPRESSION: Negative for significant acute pulmonary embolus by CTA. No other acute intrathoracic finding. No acute  intra-abdominal or pelvic finding. Electronically Signed   By: Judie Petit.  Shick M.D.   On: 06/12/2021 13:24   CT T-SPINE NO CHARGE  Result Date: 06/12/2021 CLINICAL DATA:  Thoracic spine and low back pain after injuries suffered in multiple syncopal episodes today. Initial encounter. EXAM: CT Thoracic and Lumbar spine with contrast TECHNIQUE: Multiplanar CT images of the thoracic and lumbar spine were reconstructed from contemporary CT of the Chest, Abdomen, and Pelvis CONTRAST:  None or No additional. COMPARISON:  None. FINDINGS: CT THORACIC SPINE FINDINGS Alignment: Normal. Vertebrae: No fracture or focal lesion. Paraspinal and other soft tissues: See report of dedicated chest CT today. Disc levels: Intervertebral disc space height is maintained throughout. CT LUMBAR SPINE FINDINGS Segmentation: Standard. Alignment: Normal. Vertebrae: No fracture or focal lesion. Paraspinal and other soft tissues: See report of dedicated abdomen and pelvis CT today. Disc levels: Intervertebral disc space height is maintained. IMPRESSION: Normal CT scans of the thoracic and lumbar spine. Electronically Signed   By: Drusilla Kanner M.D.   On: 06/12/2021 12:42   CT L-SPINE NO CHARGE  Result Date: 06/12/2021 CLINICAL DATA:  Thoracic spine and low back pain after injuries suffered in multiple syncopal episodes today. Initial encounter. EXAM: CT Thoracic and Lumbar spine with contrast TECHNIQUE: Multiplanar CT images of the thoracic and lumbar spine were reconstructed from contemporary CT of the Chest, Abdomen, and Pelvis CONTRAST:  None or No additional. COMPARISON:  None. FINDINGS: CT THORACIC SPINE FINDINGS Alignment: Normal. Vertebrae: No fracture or focal lesion. Paraspinal and other soft tissues: See report of dedicated chest CT today. Disc levels: Intervertebral disc space height is maintained throughout. CT LUMBAR SPINE FINDINGS Segmentation: Standard. Alignment: Normal. Vertebrae: No fracture or focal lesion. Paraspinal and  other soft tissues: See report of  dedicated abdomen and pelvis CT today. Disc levels: Intervertebral disc space height is maintained. IMPRESSION: Normal CT scans of the thoracic and lumbar spine. Electronically Signed   By: Drusilla Kanner M.D.   On: 06/12/2021 12:42   EEG adult  Result Date: 06/13/2021 Charlsie Quest, MD     06/13/2021 12:34 PM Patient Name: Abigail Harding MRN: [id number] Epilepsy Attending: Charlsie Quest Referring Physician/Provider: Dr Eliezer Bottom Date: 06/13/2021 Duration: 29.30 mins Patient history: 30 year old female with recurrent syncope.  EEG to evaluate for seizures. Level of alertness: Awake AEDs during EEG study: None Technical aspects: This EEG study was done with scalp electrodes positioned according to the 10-20 International system of electrode placement. Electrical activity was acquired at a sampling rate of  and reviewed with a high frequency filter of  and a low frequency filter of . EEG data were recorded continuously and digitally stored. Description: The posterior dominant rhythm consists of 9-10 Hz activity of moderate voltage (25-35 uV) seen predominantly in posterior head regions, symmetric and reactive to eye opening and eye closing. Event button was pressed at 0927. Patient was noted to vomiting and suddenly fell back, bed.  Concomitant EEG before, during and after the event did not show any EEG changes suggest seizure. Event button was pressed at 0934. Patient was laying in bed, not responding. Concomitant EEG before, during and after the event showed normal posterior dominant rhythm. Patient was also noted to have generalized twitching during the EEG. Concomitant EEG before, during and after the event did not show any EEG changes suggest seizure. Hyperventilation and photic stimulation were not performed.   IMPRESSION: This study is within normal limits. No seizures or epileptiform discharges were seen throughout the recording. Multiple events were  recorded as described above without concomitant EEG change.  These were nonepileptic events. Charlsie Quest   ECHOCARDIOGRAM COMPLETE  Result Date: 06/12/2021    ECHOCARDIOGRAM REPORT   Patient Name:   MACAILA TAHIR Date of Exam: 06/12/2021 Medical Rec #:  [id number]        Height:       64.0 in Accession #:    [id number]       Weight:       163.4 lb Date of Birth:  15-Mar-1991         BSA:          1.795 m Patient Age:    30 years         BP:           111/55 mmHg Patient Gender: F                HR:           60 bpm. Exam Location:  Inpatient Procedure: 2D Echo STAT ECHO Indications:    syncope  History:        Patient has no prior history of Echocardiogram examinations.  Sonographer:    Delcie Roch Referring Phys: [id number] DANIELLE RAY IMPRESSIONS  1. Left ventricular ejection fraction, by estimation, is 55 to 60%. The left ventricle has normal function. The left ventricle has no regional wall motion abnormalities. Left ventricular diastolic parameters were normal.  2. Right ventricular systolic function is normal. The right ventricular size is normal. There is normal pulmonary artery systolic pressure. The estimated right ventricular systolic pressure is 20.0 mmHg.  3. The mitral valve is grossly normal. Trivial mitral valve regurgitation. No evidence of mitral stenosis.  4. The aortic valve is tricuspid.  Aortic valve regurgitation is not visualized. No aortic stenosis is present.  5. The inferior vena cava is normal in size with greater than 50% respiratory variability, suggesting right atrial pressure of 3 mmHg. Comparison(s): No prior Echocardiogram. FINDINGS  Left Ventricle: Left ventricular ejection fraction, by estimation, is 55 to 60%. The left ventricle has normal function. The left ventricle has no regional wall motion abnormalities. The left ventricular internal cavity size was normal in size. There is  no left ventricular hypertrophy. Left ventricular diastolic parameters were normal. Right  Ventricle: The right ventricular size is normal. No increase in right ventricular wall thickness. Right ventricular systolic function is normal. There is normal pulmonary artery systolic pressure. The tricuspid regurgitant velocity is 2.06 m/s, and  with an assumed right atrial pressure of 3 mmHg, the estimated right ventricular systolic pressure is 20.0 mmHg. Left Atrium: Left atrial size was normal in size. Right Atrium: Right atrial size was normal in size. Pericardium: There is no evidence of pericardial effusion. Mitral Valve: The mitral valve is grossly normal. Trivial mitral valve regurgitation. No evidence of mitral valve stenosis. Tricuspid Valve: The tricuspid valve is normal in structure. Tricuspid valve regurgitation is trivial. No evidence of tricuspid stenosis. Aortic Valve: The aortic valve is tricuspid. Aortic valve regurgitation is not visualized. No aortic stenosis is present. Pulmonic Valve: The pulmonic valve was grossly normal. Pulmonic valve regurgitation is not visualized. No evidence of pulmonic stenosis. Aorta: The aortic root and ascending aorta are structurally normal, with no evidence of dilitation. Venous: The inferior vena cava is normal in size with greater than 50% respiratory variability, suggesting right atrial pressure of 3 mmHg. IAS/Shunts: The atrial septum is grossly normal.  LEFT VENTRICLE PLAX 2D LVIDd:         4.20 cm  Diastology LVIDs:         2.90 cm  LV e' medial:    15.10 cm/s LV PW:         0.80 cm  LV E/e' medial:  5.6 LV IVS:        0.70 cm  LV e' lateral:   26.85 cm/s LVOT diam:     1.60 cm  LV E/e' lateral: 3.2 LV SV:         38 LV SV Index:   21 LVOT Area:     2.01 cm  RIGHT VENTRICLE             IVC RV S prime:     13.20 cm/s  IVC diam: 1.90 cm TAPSE (M-mode): 2.6 cm LEFT ATRIUM             Index       RIGHT ATRIUM          Index LA diam:        2.80 cm 1.56 cm/m  RA Area:     9.91 cm LA Vol (A2C):   22.5 ml 12.53 ml/m RA Volume:   21.00 ml 11.70 ml/m LA Vol  (A4C):   17.4 ml 9.69 ml/m LA Biplane Vol: 20.4 ml 11.36 ml/m  AORTIC VALVE LVOT Vmax:   90.80 cm/s LVOT Vmean:  56.600 cm/s LVOT VTI:    0.191 m  AORTA Ao Root diam: 2.20 cm Ao Asc diam:  2.20 cm MITRAL VALVE               TRICUSPID VALVE MV Area (PHT): 3.42 cm    TR Peak grad:   17.0 mmHg MV Decel Time: 222 msec  TR Vmax:        206.00 cm/s MV E velocity: 85.30 cm/s MV A velocity: 67.70 cm/s  SHUNTS MV E/A ratio:  1.26        Systemic VTI:  0.19 m                            Systemic Diam: 1.60 cm Riley Lam MD Electronically signed by Riley Lam MD Signature Date/Time: 06/12/2021/5:48:40 PM    Final      Signed: Carmel Sacramento, MD Internal Medicine Resident, PGY-1 [facility]  Pager: [phone] 3:31 PM, 06/14/2021

## 2021-06-14 NOTE — Consult Note (Addendum)
[facility] Face-to-Face Psychiatry Consult   Reason for Consult:  Anxiety with Non-epileptogenic seizure activity Referring Physician:  Eliezer Bottom, MD Patient Identification: Abigail Harding MRN:  [id number] Principal Diagnosis: Transient loss of consciousness Diagnosis:  Principal Problem:   Transient loss of consciousness Active Problems:   Chronic pelvic pain in female   Generalized anxiety disorder   Nonepileptic episode (HCC)   Nausea with vomiting   Syncope   Total Time spent with patient: 30 minutes  Subjective:   Abigail Harding is a 30 y.o. female patient admitted with abdominal pain 2/2 endometriosis. 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 She says that the issue of her episodes of becoming unresponsive occurred sparingly prior to giving birth but started to become more frequent since. She says that right before the episodes occur, her peripheral vision becomes dark, she sees floaters, her breathing becomes labored, and her heart races. She denies having migraines after the episodes, as occurred in 2019. She sometimes goes months without an episode, but notices that they are more frequent during times of increased stress and pain. She began to see an endometriosis specialist, who 2 weeks ago, started her on Duloxetine 30 mg daily for her anxiety and pain. She would like to continue this medication at this time. She says of her anxiety, it is high when she wakes up, 8-8.5/10, but decreases to a 6/10 by noon, and never falls below 3/10. She describes her mood as "tired and sluggish." Her appetite has been poor for the past 2 months; daily marijuana use has been the only thing to increase her appetite and aid with nausea and anxiety. She denies SI/HI/AVH.   She saw a therapist once but discontinued when she was told "in order to continue to be seen, she needed to start  medication." She is open to resuming therapy with a different therapist.    HPI:  Abigail Harding is a 29 y.o. female with a past medical history of endometriosis, PID, PCOS, s/p microdiscectomy of L4-5 (6/'22),  and multiple episodes of unresponsiveness, and a past psychiatric history of GAD, ADHD, OCD, and trichotillomania.  Past Psychiatric History: GAD, ADHD, OCD, Adjustment disorder with mixed anxiety/depression, and trichotillomania; no prior inpatient psychiatric admissions. Outpatient: Saw Dr. Caryn Section, MD in 2018, [place], [place]; also recommended for therapy but unsure if she went to the therapist in addition to Dr. Maryruth Bun.  Risk to Self:  No Risk to Others:  No Prior Inpatient Therapy:  No Prior Outpatient Therapy:  Yes, once in 2019.  Past Medical History:  Past Medical History:  Diagnosis Date   ADHD (attention deficit hyperactivity disorder)    Anemia    Anxiety    Asthma    Back pain    Endometriosis    Headache    History of fainting    History of kidney stones    In the past   Ovarian cyst    PID (pelvic inflammatory disease)     Past Surgical History:  Procedure Laterality Date   DILATION AND EVACUATION N/A 02/19/2017   Procedure: DILATATION AND EVACUATION;  Surgeon: Herold Harms, MD;  Location: [facility] ORS;  Service: Gynecology;  Laterality: N/A;   LAPAROSCOPY     LAPAROSCOPY N/A 12/25/2016   Procedure: LAPAROSCOPY DIAGNOSTIC WITH BIOPSIES;  Surgeon: Herold Harms, MD;  Location: [facility] ORS;  Service: Gynecology;  Laterality: N/A;   TONSILLECTOMY  Family History:  Family History  Problem Relation Age of Onset   Diabetes Mother    Diabetes Maternal Grandmother    Heart disease Maternal Grandmother        GRT ALSO   Breast cancer Paternal Grandmother        GRT   Prostate cancer Maternal Uncle    Ovarian cancer Neg Hx    Colon cancer Neg Hx    Family Psychiatric  History: Alcohol use disorder- Father Social History:  Social History    Substance and Sexual Activity  Alcohol Use Yes   Comment: RARE     Social History   Substance and Sexual Activity  Drug Use No   Types: Marijuana   Comment: DAILY- X 4 YEARS    Social History   Socioeconomic History   Marital status: Single    Spouse name: Not on file   Number of children: Not on file   Years of education: Not on file   Highest education level: Not on file  Occupational History   Not on file  Tobacco Use   Smoking status: Never   Smokeless tobacco: Former  Building services engineer Use: Some days   Substances: Nicotine, Flavoring  Substance and Sexual Activity   Alcohol use: Yes    Comment: RARE   Drug use: No    Types: Marijuana    Comment: DAILY- X 4 YEARS   Sexual activity: Yes    Birth control/protection: None  Other Topics Concern   Not on file  Social History Narrative   Not on file   Social Determinants of Health   Financial Resource Strain: Not on file  Food Insecurity: Not on file  Transportation Needs: Not on file  Physical Activity: Not on file  Stress: Not on file  Social Connections: Not on file   Additional Social History:    Allergies:   Allergies  Allergen Reactions   Hydrocodone Itching   Ketorolac Itching and Nausea And Vomiting   Morphine And Related Itching   Tramadol Nausea And Vomiting and Other (See Comments)    cramping   Betamethasone     unknown    Labs:  Results for orders placed or performed during the hospital encounter of 06/12/21 (from the past 48 hour(s))  GC/Chlamydia probe amp ([place]) not at [facility]     Status: None   Collection Time: 06/12/21  2:03 PM  Result Value Ref Range   Neisseria Gonorrhea Negative    Chlamydia Negative    Comment Normal Reference Ranger Chlamydia - Negative    Comment      Normal Reference Range Neisseria Gonorrhea - Negative  Wet prep, genital     Status: Abnormal   Collection Time: 06/12/21  2:04 PM   Specimen: Cervix  Result Value Ref Range   Yeast Wet Prep  HPF POC NONE SEEN NONE SEEN   Trich, Wet Prep NONE SEEN NONE SEEN   Clue Cells Wet Prep HPF POC NONE SEEN NONE SEEN   WBC, Wet Prep HPF POC MODERATE (A) NONE SEEN   Sperm NONE SEEN     Comment: Performed at [facility], [address], [place], [place] [address]  Resp Panel by RT-PCR (Flu A&B, Covid) Nasopharyngeal Swab     Status: None   Collection Time: 06/12/21  2:48 PM   Specimen: Nasopharyngeal Swab; Nasopharyngeal(NP) swabs in vial transport medium  Result Value Ref Range   SARS Coronavirus 2 by RT PCR NEGATIVE NEGATIVE  Comment: (NOTE) SARS-CoV-2 target nucleic acids are NOT DETECTED.  The SARS-CoV-2 RNA is generally detectable in upper respiratory specimens during the acute phase of infection. The lowest concentration of SARS-CoV-2 viral copies this assay can detect is 138 copies/mL. A negative result does not preclude SARS-Cov-2 infection and should not be used as the sole basis for treatment or other patient management decisions. A negative result may occur with  improper specimen collection/handling, submission of specimen other than nasopharyngeal swab, presence of viral mutation(s) within the areas targeted by this assay, and inadequate number of viral copies(<138 copies/mL). A negative result must be combined with clinical observations, patient history, and epidemiological information. The expected result is Negative.  Fact Sheet for Patients:  BloggerCourse.com  Fact Sheet for Healthcare Providers:  SeriousBroker.it  This test is no t yet approved or cleared by the [place] FDA and  has been authorized for detection and/or diagnosis of SARS-CoV-2 by FDA under an Emergency Use Authorization (EUA). This EUA will remain  in effect (meaning this test can be used) for the duration of the COVID-19 declaration under Section 564(b)(1) of the Act, 21 U.S.C.section 360bbb-3(b)(1), unless the authorization is  terminated  or revoked sooner.       Influenza A by PCR NEGATIVE NEGATIVE   Influenza B by PCR NEGATIVE NEGATIVE    Comment: (NOTE) The Xpert Xpress SARS-CoV-2/FLU/RSV plus assay is intended as an aid in the diagnosis of influenza from Nasopharyngeal swab specimens and should not be used as a sole basis for treatment. Nasal washings and aspirates are unacceptable for Xpert Xpress SARS-CoV-2/FLU/RSV testing.  Fact Sheet for Patients: BloggerCourse.com  Fact Sheet for Healthcare Providers: SeriousBroker.it  This test is not yet approved or cleared by the [place] FDA and has been authorized for detection and/or diagnosis of SARS-CoV-2 by FDA under an Emergency Use Authorization (EUA). This EUA will remain in effect (meaning this test can be used) for the duration of the COVID-19 declaration under Section 564(b)(1) of the Act, 21 U.S.C. section 360bbb-3(b)(1), unless the authorization is terminated or revoked.  Performed at [facility], [address], [place], [place] [address]   TSH     Status: Abnormal   Collection Time: 06/13/21  1:55 AM  Result Value Ref Range   TSH 5.700 (H) 0.350 - 4.500 uIU/mL    Comment: Performed by a 3rd Generation assay with a functional sensitivity of <=0.01 uIU/mL. Performed at [facility], [address], [place], [place] [address]   Basic metabolic panel     Status: Abnormal   Collection Time: 06/13/21  1:55 AM  Result Value Ref Range   Sodium 140 135 - 145 mmol/L   Potassium 3.7 3.5 - 5.1 mmol/L   Chloride 105 98 - 111 mmol/L   CO2 29 22 - 32 mmol/L   Glucose, Bld 97 70 - 99 mg/dL    Comment: Glucose reference range applies only to samples taken after fasting for at least 8 hours.   BUN <5 (L) 6 - 20 mg/dL   Creatinine, Ser 0.98 0.44 - 1.00 mg/dL   Calcium 8.9 8.9 - 11.9 mg/dL   GFR, Estimated >14 >78 mL/min    Comment: (NOTE) Calculated using the CKD-EPI Creatinine  Equation (2021)    Anion gap 6 5 - 15    Comment: Performed at [facility], [address], [place], [place] [address]  CBC     Status: None   Collection Time: 06/13/21  1:55  AM  Result Value Ref Range   WBC 6.2 4.0 - 10.5 K/uL   RBC 4.10 3.87 - 5.11 MIL/uL   Hemoglobin 12.6 12.0 - 15.0 g/dL   HCT 16.137.5 09.636.0 - 04.546.0 %   MCV 91.5 80.0 - 100.0 fL   MCH 30.7 26.0 - 34.0 pg   MCHC 33.6 30.0 - 36.0 g/dL   RDW 40.912.0 81.111.5 - 91.415.5 %   Platelets 156 150 - 400 K/uL   nRBC 0.0 0.0 - 0.2 %    Comment: Performed at [facility], [address] [place], [place] [address]  Protime-INR     Status: None   Collection Time: 06/13/21  1:55 AM  Result Value Ref Range   Prothrombin Time 14.8 11.4 - 15.2 seconds   INR 1.2 0.8 - 1.2    Comment: (NOTE) INR goal varies based on device and disease states. Performed at [facility], [address] [place], [place] [address]   HIV Antibody (routine testing w rflx)     Status: None   Collection Time: 06/13/21  1:55 AM  Result Value Ref Range   HIV Screen 4th Generation wRfx Non Reactive Non Reactive    Comment: Performed at [facility], [address] [place], [place] [address]  T3     Status: None   Collection Time: 06/13/21  1:55 AM  Result Value Ref Range   T3, Total 166 71 - 180 ng/dL    Comment: (NOTE) Performed At: [facility] [address] [place], [place] [id number] Jolene SchimkeNagendra Sanjai MD UX:[id number]:[phone]   T4, free     Status: Abnormal   Collection Time: 06/13/21  1:55 AM  Result Value Ref Range   Free T4 1.50 (H) 0.61 - 1.12 ng/dL    Comment: (NOTE) Biotin ingestion may interfere with free T4 tests. If the results are inconsistent with the TSH level, previous test results, or the clinical presentation, then consider biotin interference. If needed, order repeat testing after stopping biotin. Performed at [facility], [address] [place], [place] [address]   CBC     Status: None   Collection  Time: 06/14/21  2:58 AM  Result Value Ref Range   WBC 6.4 4.0 - 10.5 K/uL   RBC 4.23 3.87 - 5.11 MIL/uL   Hemoglobin 13.0 12.0 - 15.0 g/dL   HCT 40.338.3 47.436.0 - 25.946.0 %   MCV 90.5 80.0 - 100.0 fL   MCH 30.7 26.0 - 34.0 pg   MCHC 33.9 30.0 - 36.0 g/dL   RDW 56.311.9 87.511.5 - 64.315.5 %   Platelets 161 150 - 400 K/uL   nRBC 0.0 0.0 - 0.2 %    Comment: Performed at [facility], [address] [place], [place] [address]  Basic metabolic panel     Status: Abnormal   Collection Time: 06/14/21  2:58 AM  Result Value Ref Range   Sodium 139 135 - 145 mmol/L   Potassium 3.9 3.5 - 5.1 mmol/L   Chloride 101 98 - 111 mmol/L   CO2 27 22 - 32 mmol/L   Glucose, Bld 100 (H) 70 - 99 mg/dL    Comment: Glucose reference range applies only to samples taken after fasting for at least 8 hours.   BUN <5 (L) 6 - 20 mg/dL   Creatinine, Ser 8.840.86 0.44 - 1.00 mg/dL   Calcium 9.7 8.9 - 16.610.3 mg/dL   GFR, Estimated >06>60 >30>60 mL/min    Comment: (  NOTE) Calculated using the CKD-EPI Creatinine Equation (2021)    Anion gap 11 5 - 15    Comment: Performed at [facility], [address], [place], [place] [address]    Current Facility-Administered Medications  Medication Dose Route Frequency Provider Last Rate Last Admin   acetaminophen (TYLENOL) tablet 650 mg  650 mg Oral Q6H PRN Aslam, Leanna Sato, MD       Or   acetaminophen (TYLENOL) suppository 650 mg  650 mg Rectal Q6H PRN Aslam, Sadia, MD       DULoxetine (CYMBALTA) DR capsule 30 mg  30 mg Oral Daily Aslam, Sadia, MD   30 mg at 06/14/21 0952   enoxaparin (LOVENOX) injection 40 mg  40 mg Subcutaneous Q24H Aslam, Sadia, MD   40 mg at 06/13/21 2241   HYDROmorphone (DILAUDID) injection 1 mg  1 mg Intravenous Q6H PRN Carmel Sacramento, MD   1 mg at 06/14/21 3662   promethazine (PHENERGAN) 12.5 mg in sodium chloride 0.9 % 50 mL IVPB  12.5 mg Intravenous Q6H PRN Aslam, Leanna Sato, MD 200 mL/hr at 06/14/21 0953 12.5 mg at 06/14/21 0953   senna-docusate (Senokot-S) tablet 1 tablet  1  tablet Oral QHS PRN Aslam, Leanna Sato, MD       sodium chloride flush (NS) 0.9 % injection 3 mL  3 mL Intravenous Q12H Eliezer Bottom, MD   3 mL at 06/14/21 1006    Musculoskeletal: Strength & Muscle Tone: decreased Gait & Station: unsteady 2/2 pain Patient leans: Front   Psychiatric Specialty Exam:  Presentation  General Appearance: Appropriate for Environment  Eye Contact:Good  Speech:Clear and Coherent  Speech Volume:Normal  Handedness: No data recorded  Mood and Affect  Mood:Anxious; Hopeless  Affect:Congruent   Thought Process  Thought Processes:Coherent; Goal Directed; Linear  Descriptions of Associations:Intact  Orientation:Full (Time, Place and Person)  Thought Content:Logical  History of Schizophrenia/Schizoaffective disorder:No data recorded Duration of Psychotic Symptoms:No data recorded Hallucinations:Hallucinations: None  Ideas of Reference:None  Suicidal Thoughts:Suicidal Thoughts: No  Homicidal Thoughts:Homicidal Thoughts: No   Sensorium  Memory:Immediate Good; Recent Good; Remote Good  Judgment:Good  Insight:Good   Executive Functions  Concentration:Good  Attention Span:Good  Recall:Good  Fund of Knowledge:Good  Language:Good   Psychomotor Activity  Psychomotor Activity:Psychomotor Activity: Other (comment) (Movement limited by pain)   Assets  Assets:Communication Skills; Desire for Improvement; Housing; Social Support   Sleep  Sleep:Sleep: Poor Number of Hours of Sleep: 3   Physical Exam: Physical Exam Vitals reviewed.  Constitutional:      General: She is not in acute distress. HENT:     Head: Normocephalic and atraumatic.  Eyes:     Extraocular Movements: Extraocular movements intact.     Comments: Sparse eyebrows present 2/2 trichotillomania  Cardiovascular:     Rate and Rhythm: Normal rate.  Pulmonary:     Effort: Pulmonary effort is normal.  Musculoskeletal:        General: Normal range of motion.      Cervical back: Normal range of motion.  Skin:    Comments: Well-healed surgical scar in lumbar region of back. "Knot" appreciated at the proximal tip of the scar. Patient states this is from hitting her back on night stand during a syncopal episode.   Neurological:     General: No focal deficit present.     Mental Status: She is alert and oriented to person, place, and time.  Psychiatric:        Thought Content: Thought content normal.  Judgment: Judgment normal.   Review of Systems  Psychiatric/Behavioral:  Positive for substance abuse. Negative for hallucinations, memory loss and suicidal ideas. The patient is nervous/anxious and has insomnia.        Daily marijuana use.  Blood pressure 118/65, pulse 72, temperature 98.3 F (36.8 C), temperature source Oral, resp. rate 15, weight 74.3 kg, SpO2 100 %, unknown if currently breastfeeding. Body mass index is 28.12 kg/m.  Treatment Plan Recommendation: -Start Hydroxyzine 25 mg BID PRN anxiety and itching                                                    -Start Hydroxyzine 50 mg QHS for insomnia                                                               -Advised to increase Duloxetine from 30 mg to 60 mg for anxiety and pain. Informed that standard of care for anxiety and pain is to increase to 60 mg after two weeks, then increase to 90 mg four weeks later. Patient declined at this time, stating that she would like to consult with her endometriosis specialist before adjusting doses.                                                         -Resources for outpatient therapy and psychiatry for medication management to be added to discharge instructions.      Disposition: No evidence of imminent risk to self or others at present.   Patient does not meet criteria for psychiatric inpatient admission.  Patient is determined to be psychiatrically stable at this time. Psychiatry will sign off. Please do not hesitate to call back if questions  arise. Thank you for this consult.    Lamar Sprinkles, MD 06/14/2021 12:02 PM

## 2021-06-14 NOTE — Discharge Instructions (Addendum)
Internal Medicine  Please make an appointment with you PCP within 1 week of discharge. You are being discharged with a new medication, hydroxyzine which is sent to your pharmacy. Continue to take senna-docusate for your bowel concerns as needed. Continue to make new changed in your home to avoid any falls. Follow up with your endometriosis specialist to see if you can increase your duloxetine to 60 mg to better control your anxiety which may be contributing to your episodes.    Psychiatry For [facility] residents who are uninsured or have Medicare/Medicaid: Please come to [facility] during walk in hours for appointment with psychiatrist for further medication management and for therapists for therapy.   Walk in hours are 8-11 AM Monday through Thursday for medication management. Therapy walk in hours are Monday-Wednesday 8 AM-1PM.   It is first come, first -serve; it is best to arrive by 7:00 AM.   On Friday from 1 pm to 4 pm for therapy intake only. Please arrive by 12:00 pm as it is  first come, first -serve.    When you arrive please go upstairs for your appointment. If you are unsure of where to go, inform the front desk that you are here for a walk-in appointment and they will assist you with directions upstairs.  Address:  [address] in [place], [address] Ph: [phone]    Additional Resources: [employer] [facility] include individual counseling, substance abuse intensive outpatient program (several hours a day, several days a week), day treatment Delene Loll, Medicaid, private insurance [id number] [address], [address] [place], [place] [address]  [facility]  Offers substance abuse intensive outpatient program (several hours a day, several days a week), partial hospitalization program [phone] [address] [place], [place] [address]  [phone] [address] [place], [place] [address]  [phone] [address] [place], [place] [address]  [phone] [address] [place], [place] [address]   [facility]	[place] individual counseling, family counseling, group therapy, domestic violence counseling, consumer credit counseling Accepts Medicare, Medicaid, private insurance Offers sliding scale for uninsured [phone] [address] [place], [place] [address]  [phone] [facility], [address] [place], [place] [address]  [facility] Offers individual, family and group counseling 3 locations - [place], [place], and [place]  [phone]  [address] [place], [place] [address]  [address] [place], [place] [address]  [address] [place], [place] [address]   [facility] Offers individual, couples and family counseling Accepts Medicaid, private insurance, and sliding scale for uninsured [phone] [address] [place], [place] [address]

## 2021-06-15 ENCOUNTER — Other Ambulatory Visit: Payer: Self-pay | Admitting: Internal Medicine

## 2021-06-15 ENCOUNTER — Other Ambulatory Visit: Payer: Self-pay | Admitting: Student in an Organized Health Care Education/Training Program

## 2021-06-15 DIAGNOSIS — R55 Syncope and collapse: Secondary | ICD-10-CM | POA: Diagnosis not present

## 2021-06-15 LAB — CBC
HCT: 41.9 % (ref 36.0–46.0)
Hemoglobin: 14.1 g/dL (ref 12.0–15.0)
MCH: 30.5 pg (ref 26.0–34.0)
MCHC: 33.7 g/dL (ref 30.0–36.0)
MCV: 90.7 fL (ref 80.0–100.0)
Platelets: 186 10*3/uL (ref 150–400)
RBC: 4.62 MIL/uL (ref 3.87–5.11)
RDW: 11.9 % (ref 11.5–15.5)
WBC: 5.9 10*3/uL (ref 4.0–10.5)
nRBC: 0 % (ref 0.0–0.2)

## 2021-06-15 LAB — BASIC METABOLIC PANEL
Anion gap: 12 (ref 5–15)
BUN: <5 mg/dL — ABNORMAL LOW (ref 6–20)
CO2: 30 mmol/L (ref 22–32)
Calcium: 10.1 mg/dL (ref 8.9–10.3)
Chloride: 98 mmol/L (ref 98–111)
Creatinine, Ser: 0.85 mg/dL (ref 0.44–1.00)
GFR, Estimated: >60 mL/min (ref >60)
Glucose, Bld: 99 mg/dL (ref 70–99)
Potassium: 4.4 mmol/L (ref 3.5–5.1)
Sodium: 140 mmol/L (ref 135–145)

## 2021-06-15 MED ORDER — DULOXETINE HCL 30 MG PO CPEP: 30.0000 mg | ORAL_CAPSULE | Freq: Every day | ORAL | 0 refills | Status: DC

## 2021-06-15 MED ORDER — SENNOSIDES-DOCUSATE SODIUM 8.6-50 MG PO TABS
2.0000 | ORAL_TABLET | Freq: Every day | ORAL | Status: DC
  Administered 2021-06-15: 2 via ORAL
  Filled 2021-06-15: qty 2

## 2021-06-15 MED ORDER — HYDROXYZINE HCL 50 MG PO TABS: 50.0000 mg | ORAL_TABLET | Freq: Every day | ORAL | 0 refills | Status: DC

## 2021-06-15 MED ORDER — HYDROXYZINE HCL 25 MG PO TABS: 25.0000 mg | ORAL_TABLET | Freq: Two times a day (BID) | ORAL | 0 refills | Status: DC | PRN

## 2021-06-15 MED ORDER — SENNOSIDES-DOCUSATE SODIUM 8.6-50 MG PO TABS: 2.0000 | ORAL_TABLET | Freq: Every day | ORAL | 0 refills | Status: DC

## 2021-06-15 MED ORDER — SENNOSIDES-DOCUSATE SODIUM 8.6-50 MG PO TABS: 2.0000 | ORAL_TABLET | Freq: Every day | ORAL | 0 refills | Status: AC

## 2021-06-15 MED ORDER — PROMETHAZINE HCL 12.5 MG PO TABS: 12.5000 mg | ORAL_TABLET | Freq: Four times a day (QID) | ORAL | 0 refills | Status: DC | PRN

## 2021-06-15 MED ORDER — HYDROXYZINE HCL 50 MG PO TABS
50.0000 mg | ORAL_TABLET | Freq: Every day | ORAL | 0 refills | Status: DC
Start: 2021-06-15 — End: 2021-06-15

## 2021-06-15 NOTE — Progress Notes (Signed)
Patient ate one of the 2 muffins she had. Said she may try and eat the other one later.

## 2021-06-15 NOTE — Progress Notes (Signed)
Patient discharged via wheelchair at this time.

## 2021-06-15 NOTE — Progress Notes (Signed)
Discharge instruction given to the patient and her mother and both stated understanding. Medication to take at home explained and stated understanding. Questions and concern answered.

## 2021-06-15 NOTE — TOC Progression Note (Signed)
Transition of Care [facility]) - Progression Note    Patient Details  Name: Abigail Harding MRN: [id number] Date of Birth: [birth year]  [facility] CM/SW Contact  Lorri Frederick, LCSW Phone Number: 06/15/2021, 11:25 AM  Clinical Narrative:   Pt discharging home, mother will provide transportation.  DME shower chair ordered from [employer].  No other needs identified.      Expected Discharge Plan: Home/Self Care Barriers to Discharge: No Barriers Identified  Expected Discharge Plan and Services Expected Discharge Plan: Home/Self Care     Post Acute Care Choice: Durable Medical Equipment (shower chair) Living arrangements for the past 2 months: Single Family Home Expected Discharge Date: 06/15/21               DME Arranged: Shower stool DME Agency: [employer] Date DME Agency Contacted: 06/15/21 Time DME Agency Contacted: 1125 Representative spoke with at DME Agency: Velna Hatchet HH Arranged: NA HH Agency: NA         Social Determinants of Health (SDOH) Interventions    Readmission Risk Interventions No flowsheet data found.

## 2021-06-15 NOTE — Progress Notes (Signed)
NT came to this nurse saying that the patient passed out for about a minute. MD made aware. Per MD patient stable for discharge. VS checked and within normal limit. IV and tele monitor discontinued.

## 2021-06-16 ENCOUNTER — Emergency Department
Admission: EM | Admit: 2021-06-16 | Discharge: 2021-06-16 | Disposition: A | Discharge status: Home / Self Care | Payer: Medicaid Other | Attending: Emergency Medicine | Admitting: Emergency Medicine

## 2021-06-16 ENCOUNTER — Other Ambulatory Visit: Payer: Self-pay

## 2021-06-16 DIAGNOSIS — R569 Unspecified convulsions: Secondary | ICD-10-CM | POA: Insufficient documentation

## 2021-06-16 DIAGNOSIS — R112 Nausea with vomiting, unspecified: Secondary | ICD-10-CM | POA: Insufficient documentation

## 2021-06-16 DIAGNOSIS — Z5321 Procedure and treatment not carried out due to patient leaving prior to being seen by health care provider: Secondary | ICD-10-CM | POA: Insufficient documentation

## 2021-06-16 DIAGNOSIS — R531 Weakness: Secondary | ICD-10-CM | POA: Insufficient documentation

## 2021-06-16 LAB — BASIC METABOLIC PANEL
Anion gap: 13 (ref 5–15)
BUN: 7 mg/dL (ref 6–20)
CO2: 24 mmol/L (ref 22–32)
Calcium: 10.5 mg/dL — ABNORMAL HIGH (ref 8.9–10.3)
Chloride: 101 mmol/L (ref 98–111)
Creatinine, Ser: 0.93 mg/dL (ref 0.44–1.00)
GFR, Estimated: >60 mL/min (ref >60)
Glucose, Bld: 99 mg/dL (ref 70–99)
Potassium: 4.6 mmol/L (ref 3.5–5.1)
Sodium: 138 mmol/L (ref 135–145)

## 2021-06-16 LAB — CBC
HCT: 46.5 % — ABNORMAL HIGH (ref 36.0–46.0)
Hemoglobin: 15.8 g/dL — ABNORMAL HIGH (ref 12.0–15.0)
MCH: 31.7 pg (ref 26.0–34.0)
MCHC: 34 g/dL (ref 30.0–36.0)
MCV: 93.4 fL (ref 80.0–100.0)
Platelets: 191 10*3/uL (ref 150–400)
RBC: 4.98 MIL/uL (ref 3.87–5.11)
RDW: 11.9 % (ref 11.5–15.5)
WBC: 9.7 10*3/uL (ref 4.0–10.5)
nRBC: 0 % (ref 0.0–0.2)

## 2021-06-16 LAB — POC URINE PREG, ED: Preg Test, Ur: NEGATIVE

## 2021-06-16 NOTE — ED Triage Notes (Signed)
Pt here with multiple seizures per the pt. Pt's mother states that this happened when she had her first child but stopped for a few years but has returned worse. Pt reports 5 seizures today. Pt does not take any medication for seizures but was going to the neurologist today for an eval.

## 2021-06-16 NOTE — ED Notes (Signed)
Pt LWBS after triage.  Pt's IV removed.

## 2021-06-22 ENCOUNTER — Other Ambulatory Visit: Payer: Self-pay | Admitting: Student in an Organized Health Care Education/Training Program

## 2021-07-02 ENCOUNTER — Emergency Department (HOSPITAL_COMMUNITY): Payer: Medicaid Other

## 2021-07-02 ENCOUNTER — Emergency Department (HOSPITAL_COMMUNITY)
Admission: EM | Admit: 2021-07-02 | Discharge: 2021-07-02 | Disposition: A | Discharge status: Home / Self Care | Payer: Medicaid Other | Attending: Emergency Medicine | Admitting: Emergency Medicine

## 2021-07-02 ENCOUNTER — Other Ambulatory Visit: Payer: Self-pay

## 2021-07-02 ENCOUNTER — Encounter (HOSPITAL_COMMUNITY): Payer: Self-pay

## 2021-07-02 DIAGNOSIS — R197 Diarrhea, unspecified: Secondary | ICD-10-CM | POA: Insufficient documentation

## 2021-07-02 DIAGNOSIS — R1031 Right lower quadrant pain: Secondary | ICD-10-CM | POA: Diagnosis not present

## 2021-07-02 DIAGNOSIS — R102 Pelvic and perineal pain: Secondary | ICD-10-CM | POA: Diagnosis not present

## 2021-07-02 DIAGNOSIS — J45909 Unspecified asthma, uncomplicated: Secondary | ICD-10-CM | POA: Insufficient documentation

## 2021-07-02 DIAGNOSIS — R112 Nausea with vomiting, unspecified: Secondary | ICD-10-CM

## 2021-07-02 DIAGNOSIS — R109 Unspecified abdominal pain: Secondary | ICD-10-CM

## 2021-07-02 DIAGNOSIS — Z87891 Personal history of nicotine dependence: Secondary | ICD-10-CM | POA: Diagnosis not present

## 2021-07-02 HISTORY — DX: Unspecified convulsions: R56.9

## 2021-07-02 LAB — COMPREHENSIVE METABOLIC PANEL
ALT: <5 U/L (ref 0–44)
AST: 28 U/L (ref 15–41)
Albumin: 4.4 g/dL (ref 3.5–5.0)
Alkaline Phosphatase: 33 U/L — ABNORMAL LOW (ref 38–126)
Anion gap: 11 (ref 5–15)
BUN: 8 mg/dL (ref 6–20)
CO2: 22 mmol/L (ref 22–32)
Calcium: 9.5 mg/dL (ref 8.9–10.3)
Chloride: 107 mmol/L (ref 98–111)
Creatinine, Ser: 0.8 mg/dL (ref 0.44–1.00)
GFR, Estimated: >60 mL/min (ref >60)
Glucose, Bld: 99 mg/dL (ref 70–99)
Potassium: 5.6 mmol/L — ABNORMAL HIGH (ref 3.5–5.1)
Sodium: 140 mmol/L (ref 135–145)
Total Bilirubin: 0.6 mg/dL (ref 0.3–1.2)
Total Protein: 6.8 g/dL (ref 6.5–8.1)

## 2021-07-02 LAB — CBC WITH DIFFERENTIAL/PLATELET
Abs Immature Granulocytes: 0.02 10*3/uL (ref 0.00–0.07)
Basophils Absolute: 0 10*3/uL (ref 0.0–0.1)
Basophils Relative: 0 %
Eosinophils Absolute: 0.1 10*3/uL (ref 0.0–0.5)
Eosinophils Relative: 1 %
HCT: 41.2 % (ref 36.0–46.0)
Hemoglobin: 14 g/dL (ref 12.0–15.0)
Immature Granulocytes: 0 %
Lymphocytes Relative: 21 %
Lymphs Abs: 1.6 10*3/uL (ref 0.7–4.0)
MCH: 30.8 pg (ref 26.0–34.0)
MCHC: 34 g/dL (ref 30.0–36.0)
MCV: 90.7 fL (ref 80.0–100.0)
Monocytes Absolute: 0.6 10*3/uL (ref 0.1–1.0)
Monocytes Relative: 7 %
Neutro Abs: 5.4 10*3/uL (ref 1.7–7.7)
Neutrophils Relative %: 71 %
Platelets: 266 10*3/uL (ref 150–400)
RBC: 4.54 MIL/uL (ref 3.87–5.11)
RDW: 12.2 % (ref 11.5–15.5)
WBC: 7.7 10*3/uL (ref 4.0–10.5)
nRBC: 0 % (ref 0.0–0.2)

## 2021-07-02 LAB — WET PREP, GENITAL
Clue Cells Wet Prep HPF POC: NONE SEEN
Sperm: NONE SEEN
Trich, Wet Prep: NONE SEEN
Yeast Wet Prep HPF POC: NONE SEEN

## 2021-07-02 LAB — URINALYSIS, ROUTINE W REFLEX MICROSCOPIC
Bacteria, UA: NONE SEEN
Bilirubin Urine: NEGATIVE
Glucose, UA: NEGATIVE mg/dL
Hgb urine dipstick: NEGATIVE
Ketones, ur: NEGATIVE mg/dL
Nitrite: NEGATIVE
Protein, ur: NEGATIVE mg/dL
Specific Gravity, Urine: 1.016 (ref 1.005–1.030)
pH: 8 (ref 5.0–8.0)

## 2021-07-02 LAB — I-STAT BETA HCG BLOOD, ED (MC, WL, AP ONLY): I-stat hCG, quantitative: <5 m[IU]/mL (ref <5)

## 2021-07-02 LAB — CBG MONITORING, ED: Glucose-Capillary: 122 mg/dL — ABNORMAL HIGH (ref 70–99)

## 2021-07-02 LAB — RPR: RPR Ser Ql: NONREACTIVE

## 2021-07-02 IMAGING — US US PELVIS COMPLETE
1 series · 13 of 25 positions shown · non-contrast
Comparison: Comparison made with [DATE] CT evaluation.

CLINICAL DATA: Pelvic pain for 1 day.

EXAM:
TRANSABDOMINAL ULTRASOUND OF PELVIS
DOPPLER ULTRASOUND OF OVARIES
TECHNIQUE: Transabdominal ultrasound examination of the pelvis was performed
including evaluation of the uterus, ovaries, adnexal regions, and
pelvic cul-de-sac.
Color and duplex Doppler ultrasound was utilized to evaluate blood
flow to the ovaries.

[Series 1: us pelvic complete w transvaginal and torsion righ · 13 of 40 slices shown]
[im 1/40]
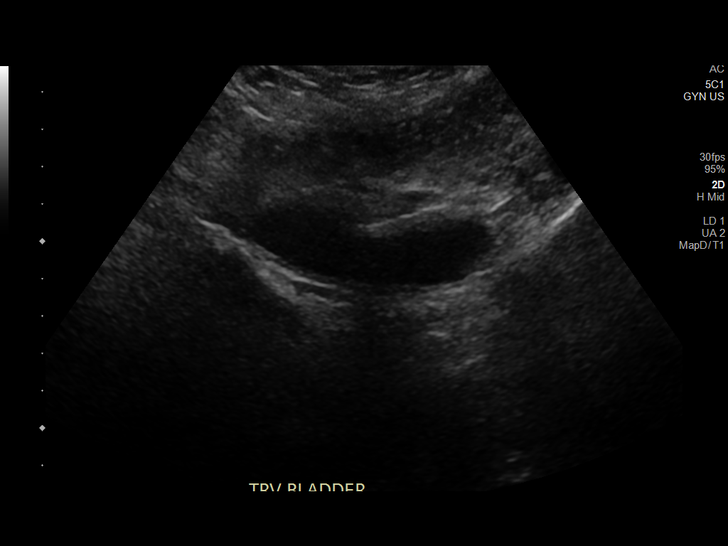
[im 4/40]
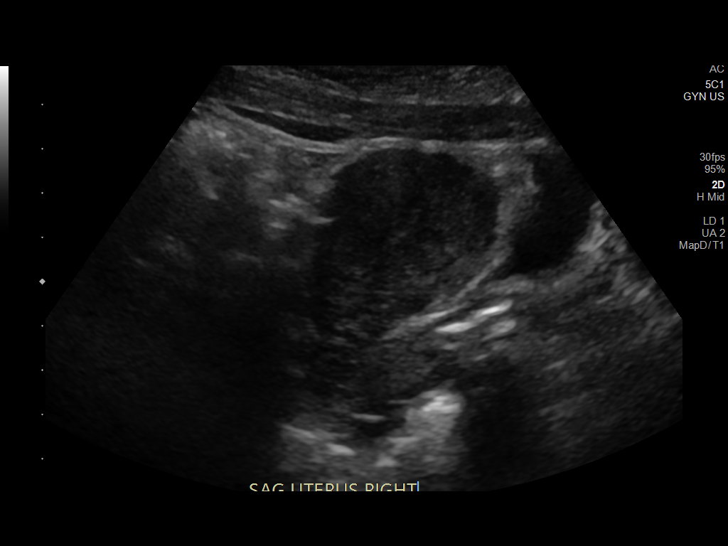
[im 7/40]
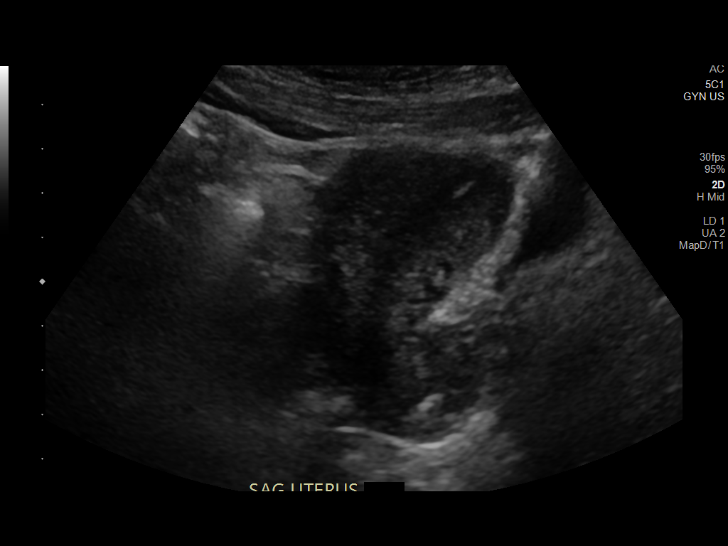
[im 10/40]
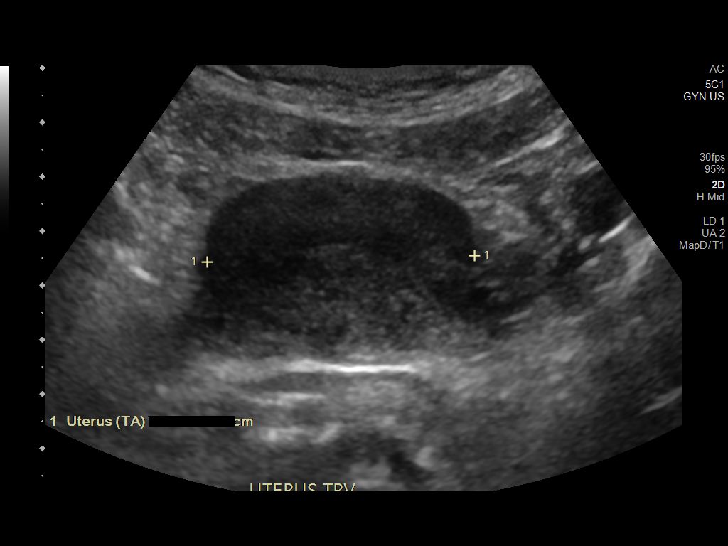
[im 14/40]
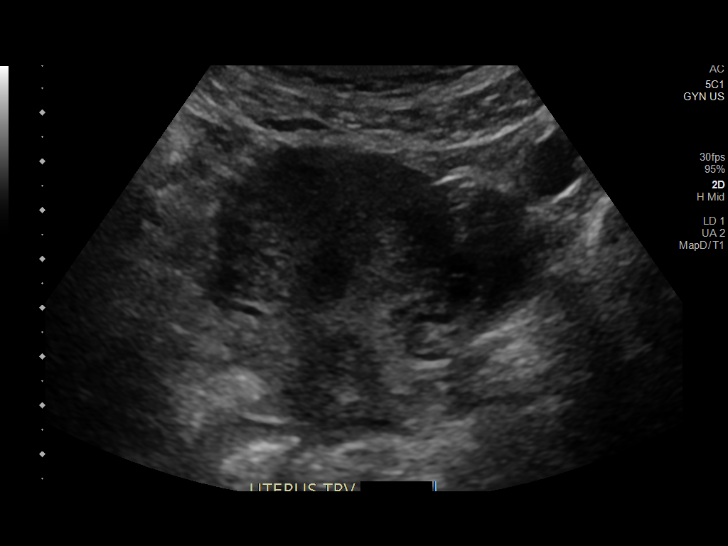
[im 17/40]
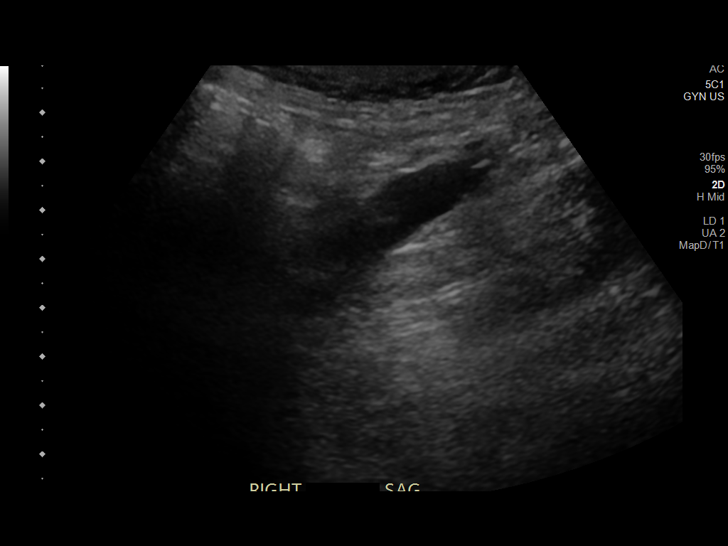
[im 20/40]
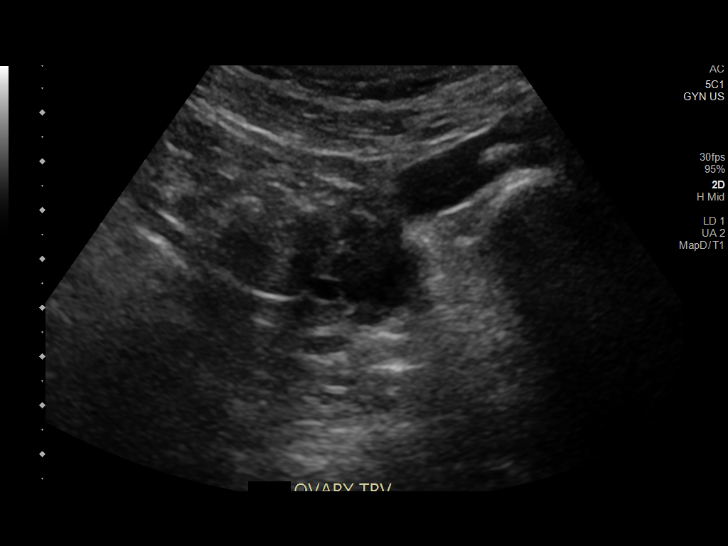
[im 23/40]
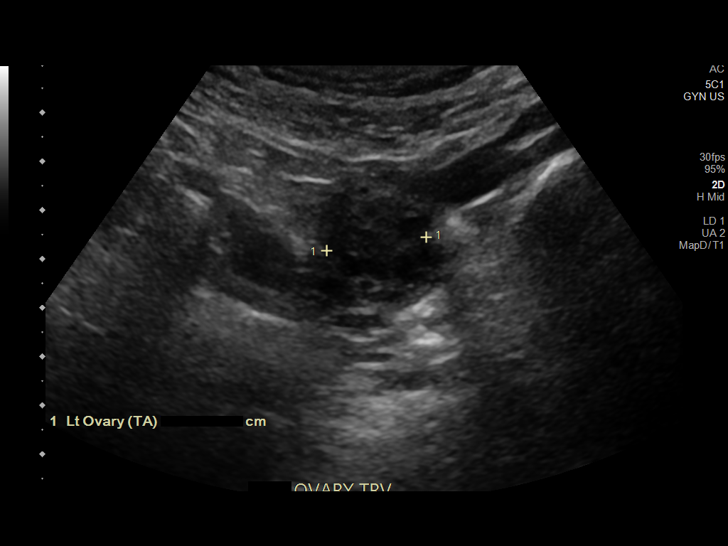
[im 27/40]
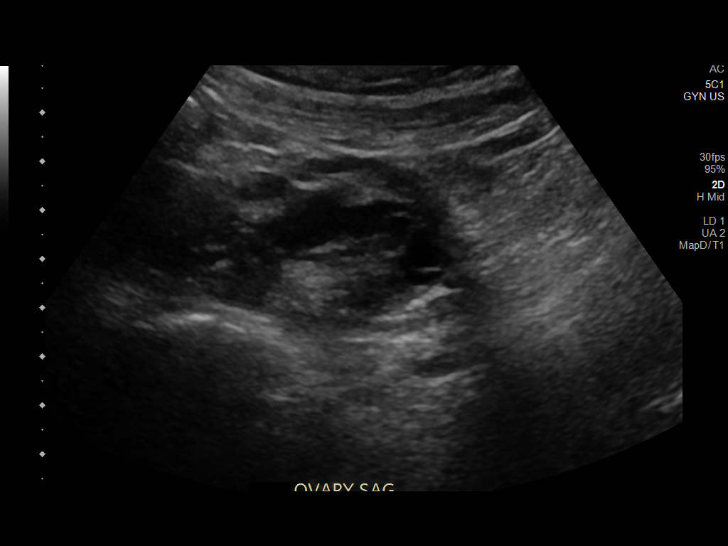
[im 30/40]
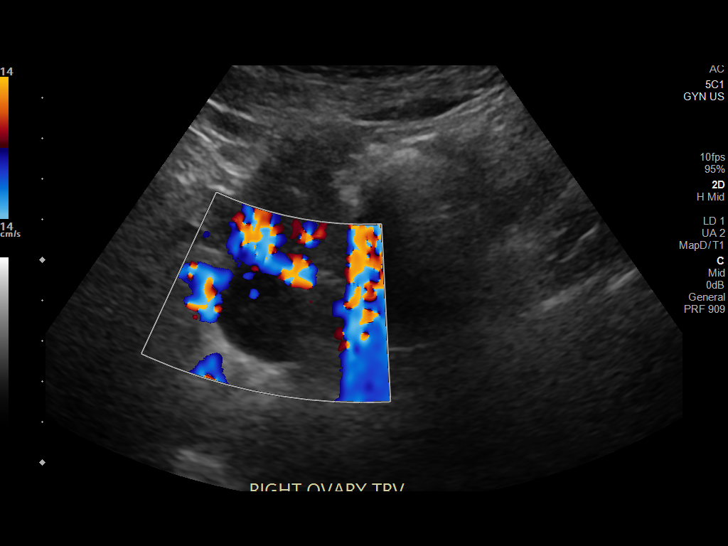
[im 33/40]
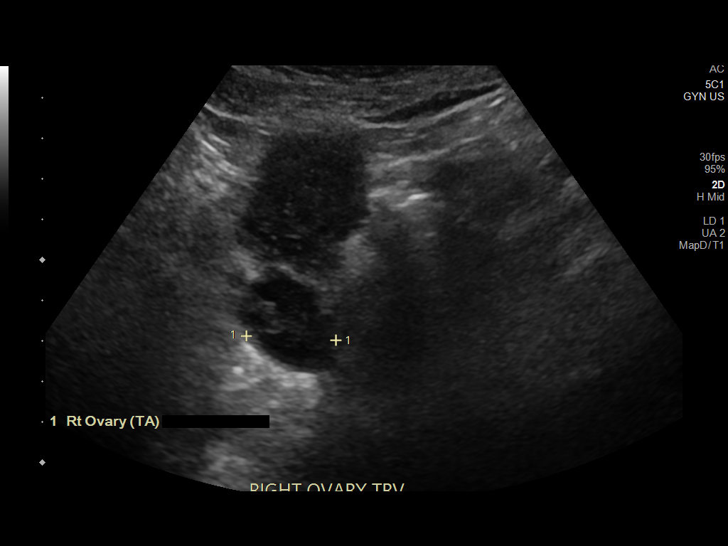
[im 36/40]
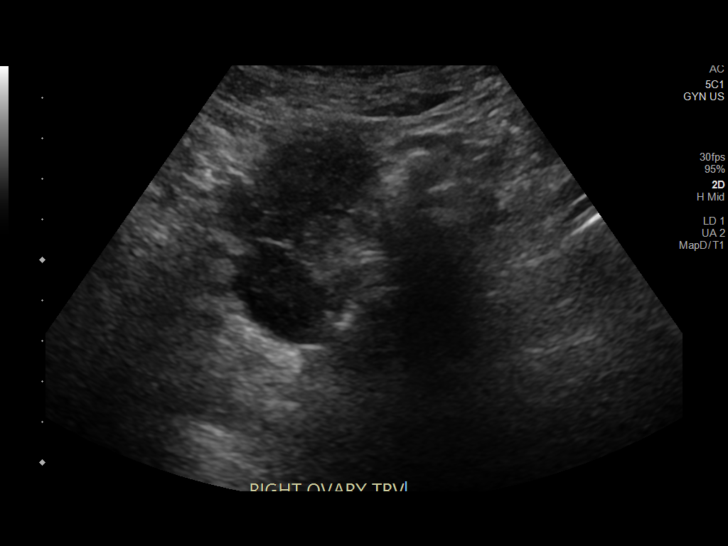
[im 40/40]
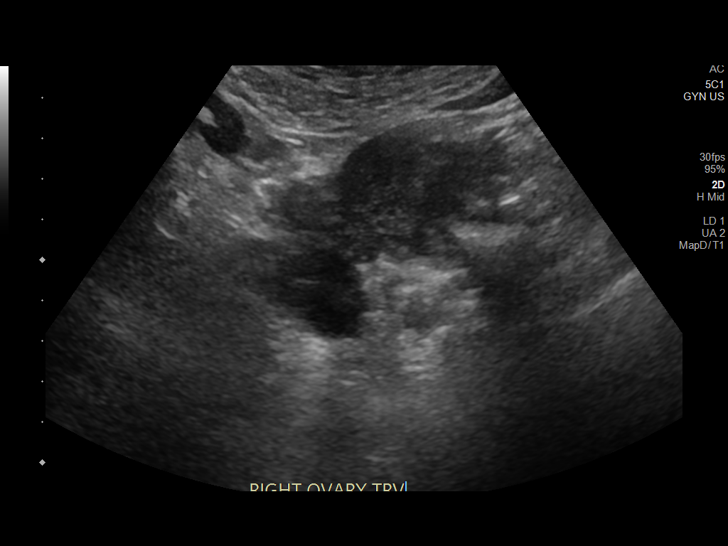

[13 of 25 positions shown; findings below may reference images not displayed]

FINDINGS: Uterus

Measurements: 4.6 x 4.0 x 5.0 cm = volume: 47 mL. No fibroids or
other mass visualized.

Endometrium

Thickness: 5.4 mm.  No focal abnormality visualized.

Right ovary

Measurements: 2.6 x 2.0 x 2.2 cm = volume: 5.9 mL. Normal
appearance/no adnexal mass.

Left ovary

Measurements: 3.2 x 2.1 x 2.1 cm = volume: 7.3 mL. Normal
appearance/no adnexal mass.

Pulsed Doppler evaluation demonstrates normal low resistance flow is
demonstrated to the LEFT ovary.

RIGHT ovary also with flow pattern that is low resistance though
with more limited assessment due to ovarian position. Apparently the
patient had a "seizure" during the examination endovaginal scanning
not performed due to reported discomfort and increased seizure
activity with "penetration".

Other: No fluid adjacent to the RIGHT adnexa. There was pain in the
RIGHT lower quadrant by report during the examination.
IMPRESSION: Mildly limited exam due to limitations outlined above with lack of
endovaginal imaging. Transabdominal imaging without ovarian
enlargement or fluid in the RIGHT adnexa and with normal low
resistance flow to the LEFT ovary, limited assessment of flow
pattern however on the RIGHT. If there is continued pain or concern
for torsion repeat evaluation could be considered.

## 2021-07-02 MED ORDER — METOCLOPRAMIDE HCL 5 MG/ML IJ SOLN
10.0000 mg | Freq: Once | INTRAMUSCULAR | Status: AC
  Administered 2021-07-02: 10 mg via INTRAVENOUS
  Filled 2021-07-02: qty 2

## 2021-07-02 MED ORDER — LORAZEPAM 2 MG/ML IJ SOLN
0.5000 mg | Freq: Once | INTRAMUSCULAR | Status: AC
  Administered 2021-07-02: 0.5 mg via INTRAVENOUS
  Filled 2021-07-02: qty 1

## 2021-07-02 MED ORDER — METOCLOPRAMIDE HCL 10 MG PO TABS: 10.0000 mg | ORAL_TABLET | Freq: Three times a day (TID) | ORAL | 0 refills | Status: DC | PRN

## 2021-07-02 MED ORDER — KETOROLAC TROMETHAMINE 15 MG/ML IJ SOLN
15.0000 mg | Freq: Once | INTRAMUSCULAR | Status: AC
  Administered 2021-07-02: 15 mg via INTRAVENOUS
  Filled 2021-07-02: qty 1

## 2021-07-02 MED ORDER — SODIUM CHLORIDE 0.9 % IV BOLUS
500.0000 mL | Freq: Once | INTRAVENOUS | Status: AC
  Administered 2021-07-02: 500 mL via INTRAVENOUS

## 2021-07-02 MED ORDER — DIPHENHYDRAMINE HCL 50 MG/ML IJ SOLN
25.0000 mg | Freq: Once | INTRAMUSCULAR | Status: AC
  Administered 2021-07-02: 25 mg via INTRAVENOUS
  Filled 2021-07-02: qty 1

## 2021-07-02 NOTE — ED Triage Notes (Signed)
Pt bib [facility] from home with complaints of n/v/d and RLQ pain that started this am around 0600. Pt also has episodes on unresponsiveness and says that she hasn't had a seizure in 6 days but started having them again this morning.  EMS vitals: 116/70, 90 HR, 100% RA 4mg  zofran given en route

## 2021-07-02 NOTE — ED Provider Notes (Signed)
[facility] Provider Note   CSN: [id number] Arrival date & time: 07/02/21  0840     History Chief Complaint  Patient presents with   Abdominal Pain    Abigail Harding is a 30 y.o. female presenting for evaluation of nausea, vomiting, abdominal pain.  Patient states she woke up with severe right lower quadrant abdominal pain.  Associated nausea and vomiting.  She has also had diarrhea.  She states she was feeling well yesterday.  Pain has been constant, nothing makes it better or worse.  She is now just dry heaving.  She denies fevers, chills, chest pain, shortness of breath, cough, urinary symptoms.  Patient with a history of endometriosis, ovarian cysts.  She is on OCPs to suppress periods, has not had one in several months.  Previous history of appendectomy, drainage of a ruptured cyst, and several laparoscopic evaluations for endometriosis and a D&C.   Additional history obtained from chart review.  History of anxiety, anemia, asthma, endometriosis, ovarian cysts, PID, nonepileptic seizures/psychogenic seizures, chronic pelvic pain  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   Nausea with vomiting 06/13/2021   Syncope 06/13/2021   Health care maintenance 04/12/2018   Endometriosis determined by laparoscopy 01/02/2017   Transient loss of consciousness 12/31/2016   Headache 12/21/2016   Right lower quadrant abdominal pain 10/31/2016   Family history of endometriosis in first degree relative 10/31/2016   Round ligament pain 10/31/2016   Dysmenorrhea 10/31/2016   Pelvic pain  10/31/2016   Chronic pelvic pain in female 03/16/2016   Generalized anxiety disorder 03/02/2016   Oligo-ovulation 11/17/2015    Past Surgical History:  Procedure Laterality Date   DILATION AND EVACUATION N/A 02/19/2017   Procedure: DILATATION AND EVACUATION;  Surgeon: Herold HarmsMartin A Defrancesco, MD;  Location: [facility] ORS;  Service: Gynecology;  Laterality: N/A;   LAPAROSCOPY     LAPAROSCOPY N/A 12/25/2016   Procedure: LAPAROSCOPY DIAGNOSTIC WITH BIOPSIES;  Surgeon: Herold HarmsMartin A Defrancesco, MD;  Location: [facility] ORS;  Service: Gynecology;  Laterality: N/A;   TONSILLECTOMY       OB History     Gravida  2   Para  0   Term  0   Preterm  0   AB  1   Living  0      SAB  1   IAB  0   Ectopic  0   Multiple  0   Live Births  0           Family History  Problem Relation Age of Onset   Diabetes Mother    Diabetes Maternal Grandmother    Heart disease Maternal Grandmother        GRT ALSO   Breast cancer Paternal Grandmother        GRT   Prostate cancer Maternal Uncle    Ovarian cancer Neg Hx    Colon cancer Neg Hx     Social History   Tobacco Use  Smoking status: Never   Smokeless tobacco: Former  Building services engineer Use: Some days   Substances: Nicotine, Flavoring  Substance Use Topics   Alcohol use: Yes    Comment: RARE   Drug use: No    Types: Marijuana    Comment: DAILY- X 4 YEARS    Home Medications Prior to Admission medications   Medication Sig Start Date End Date Taking? Authorizing Provider  albuterol (VENTOLIN HFA) 108 (90 Base) MCG/ACT inhaler Inhale 1-2 puffs into the lungs as needed for shortness of breath. 05/15/21  Yes [provider]  celecoxib (CELEBREX) 200 MG capsule Take 200 mg by mouth as needed for moderate pain. 03/16/21  Yes [provider]  clonazePAM (KLONOPIN) 0.5 MG tablet Take 0.5 mg by mouth as needed for anxiety. 06/01/21  Yes [provider]  cyclobenzaprine (FLEXERIL) 5 MG tablet Take 5 mg by mouth 3 (three)  times daily as needed for muscle spasms. 03/16/21  Yes [provider]  Drospirenone (SLYND PO) Take 1 tablet by mouth daily. Pt skips placebo pills and continues to next pack in order to skip period   Yes [provider]  DULoxetine HCl 40 MG CPEP Take 1 capsule by mouth daily. 06/28/21  Yes [provider]  fluticasone (FLONASE) 50 MCG/ACT nasal spray Place 1 spray into the nose daily as needed.   Yes [provider]  metoCLOPramide (REGLAN) 10 MG tablet Take 1 tablet (10 mg total) by mouth every 8 (eight) hours as needed for nausea. 07/02/21  Yes Kyria Bumgardner, PA-C  promethazine (PHENERGAN) 12.5 MG tablet Take 1 tablet (12.5 mg total) by mouth every 6 (six) hours as needed for nausea or vomiting. 06/15/21  Yes Verdene Lennert, MD  tiZANidine (ZANAFLEX) 4 MG tablet Take 2 mg by mouth every 8 (eight) hours as needed. 04/21/21  Yes [provider]  DULoxetine (CYMBALTA) 30 MG capsule Take 1 capsule (30 mg total) by mouth daily. Patient not taking: No sig reported 06/16/21 07/16/21  Carmel Sacramento, MD  hydrOXYzine (ATARAX/VISTARIL) 25 MG tablet Take 1 tablet (25 mg total) by mouth 2 (two) times daily as needed for itching or anxiety. Patient not taking: No sig reported 06/15/21   Tyson Alias, MD  hydrOXYzine (ATARAX/VISTARIL) 50 MG tablet Take 1 tablet (50 mg total) by mouth at bedtime. Patient not taking: No sig reported 06/15/21   Tyson Alias, MD  Prenat w/o A Vit-FeFum-FePo-FA (CONCEPT OB) 130-92.4-1 MG CAPS Take 1 capsule by mouth daily. Patient not taking: Reported on 07/02/2021 10/15/17   Hildred Laser, MD  senna-docusate (SENOKOT-S) 8.6-50 MG tablet Take 2 tablets by mouth daily. Patient not taking: No sig reported 06/15/21 07/15/21  Tyson Alias, MD    Allergies    Hydrocodone, Ketorolac, Morphine and related, Tramadol, Betamethasone, and Hydroxyzine  Review of Systems   Review of Systems  Gastrointestinal:  Positive for  abdominal pain, diarrhea, nausea and vomiting.  All other systems reviewed and are negative.  Physical Exam Updated Vital Signs BP (!) 108/59   Pulse 79   Temp 98.9 F (37.2 C) (Oral)   Resp 14   Ht 5\' 4"  (1.626 m)   Wt 54.4 kg   SpO2 100%   BMI 20.60 kg/m   Physical Exam Vitals and nursing note reviewed. Exam conducted with a chaperone present.  Constitutional:      General: She is not in acute distress.    Appearance: Normal appearance.     Comments: Nontoxic  HENT:     Head: Normocephalic and atraumatic.  Eyes:     Conjunctiva/sclera: Conjunctivae normal.     Pupils: Pupils are equal, round, and reactive to light.  Cardiovascular:     Rate and Rhythm: Normal rate and regular rhythm.     Pulses: Normal pulses.  Pulmonary:     Effort: Pulmonary effort is normal. No respiratory distress.     Breath sounds: Normal breath sounds. No wheezing.     Comments: Speaking in full sentences.  Clear lung sounds in all fields. Abdominal:     General: There is no distension.     Palpations: Abdomen is soft. There is no mass.     Tenderness: There is abdominal tenderness. There is no guarding or rebound.     Comments: Tenderness palpation of right lower quadrant abdomen.  No rigidity, guarding, distention.  Negative rebound.  No peritonitis.  Genitourinary:    Cervix: Discharge present. No cervical motion tenderness.     Adnexa:        Right: Tenderness present.      Comments: Significant tenderness with the entire exam.  Yellow discharge noted come from the cervix.  Increased right adnexal tenderness.  No CMT. Musculoskeletal:        General: Normal range of motion.     Cervical back: Normal range of motion and neck supple.  Skin:    General: Skin is warm and dry.     Capillary Refill: Capillary refill takes less than 2 seconds.  Neurological:     Mental Status: She is alert and oriented to person, place, and time.  Psychiatric:        Mood and Affect: Mood and affect normal.         Speech: Speech normal.        Behavior: Behavior normal.    ED Results / Procedures / Treatments   Labs (all labs ordered are listed, but only abnormal results are displayed) Labs Reviewed  WET PREP, GENITAL - Abnormal; Notable for the following components:      Result Value   WBC, Wet Prep HPF POC MANY (*)    All other components within normal limits  COMPREHENSIVE METABOLIC PANEL - Abnormal; Notable for the following components:   Potassium 5.6 (*)    Alkaline Phosphatase 33 (*)    All other components within normal limits  URINALYSIS, ROUTINE W REFLEX MICROSCOPIC - Abnormal; Notable for the following components:   APPearance TURBID (*)    Leukocytes,Ua SMALL (*)    All other components within normal limits  CBG MONITORING, ED - Abnormal; Notable for the following components:   Glucose-Capillary 122 (*)    All other components within normal limits  CBC WITH DIFFERENTIAL/PLATELET  RPR  I-STAT BETA HCG BLOOD, ED ([facility], [facility], [facility] ONLY)  GC/CHLAMYDIA PROBE AMP ([place]) NOT AT [facility]    EKG EKG Interpretation  Date/Time:  Saturday July 02 2021 08:48:30 EDT Ventricular Rate:  86 PR Interval:  144 QRS Duration: 96 QT Interval:  378 QTC Calculation: 453 R Axis:   64 Text Interpretation: Sinus rhythm Confirmed by Cathren Laine ([id number]) on 07/02/2021 11:27:48 AM  Radiology US PELVIS (TRANSABDOMINAL ONLY)  Result Date: 07/02/2021 CLINICAL DATA:  Pelvic pain for 1 day. EXAM: TRANSABDOMINAL ULTRASOUND OF PELVIS DOPPLER ULTRASOUND OF OVARIES TECHNIQUE: Transabdominal ultrasound examination of the pelvis was performed including evaluation of the uterus, ovaries, adnexal regions, and pelvic cul-de-sac. Color and duplex Doppler ultrasound was utilized to evaluate blood flow to the  ovaries. COMPARISON:  Comparison made with June 12, 2021 CT evaluation. FINDINGS: Uterus Measurements: 4.6 x 4.0 x 5.0 cm = volume: 47 mL. No fibroids or other mass visualized. Endometrium Thickness: 5.4  mm.  No focal abnormality visualized. Right ovary Measurements: 2.6 x 2.0 x 2.2 cm = volume: 5.9 mL. Normal appearance/no adnexal mass. Left ovary Measurements: 3.2 x 2.1 x 2.1 cm = volume: 7.3 mL. Normal appearance/no adnexal mass. Pulsed Doppler evaluation demonstrates normal low resistance flow is demonstrated to the LEFT ovary. RIGHT ovary also with flow pattern that is low resistance though with more limited assessment due to ovarian position. Apparently the patient had a "seizure" during the examination endovaginal scanning not performed due to reported discomfort and increased seizure activity with "penetration". Other: No fluid adjacent to the RIGHT adnexa. There was pain in the RIGHT lower quadrant by report during the examination. IMPRESSION: Mildly limited exam due to limitations outlined above with lack of endovaginal imaging. Transabdominal imaging without ovarian enlargement or fluid in the RIGHT adnexa and with normal low resistance flow to the LEFT ovary, limited assessment of flow pattern however on the RIGHT. If there is continued pain or concern for torsion repeat evaluation could be considered. Electronically Signed   By: Donzetta Kohut M.D.   On: 07/02/2021 10:48   US PELVIC DOPPLER (TORSION R/O OR MASS ARTERIAL FLOW)  Result Date: 07/02/2021 CLINICAL DATA:  Pelvic pain for 1 day. EXAM: TRANSABDOMINAL ULTRASOUND OF PELVIS DOPPLER ULTRASOUND OF OVARIES TECHNIQUE: Transabdominal ultrasound examination of the pelvis was performed including evaluation of the uterus, ovaries, adnexal regions, and pelvic cul-de-sac. Color and duplex Doppler ultrasound was utilized to evaluate blood flow to the ovaries. COMPARISON:  Comparison made with June 12, 2021 CT evaluation. FINDINGS: Uterus Measurements: 4.6 x 4.0 x 5.0 cm = volume: 47 mL. No fibroids or other mass visualized. Endometrium Thickness: 5.4 mm.  No focal abnormality visualized. Right ovary Measurements: 2.6 x 2.0 x 2.2 cm = volume: 5.9 mL.  Normal appearance/no adnexal mass. Left ovary Measurements: 3.2 x 2.1 x 2.1 cm = volume: 7.3 mL. Normal appearance/no adnexal mass. Pulsed Doppler evaluation demonstrates normal low resistance flow is demonstrated to the LEFT ovary. RIGHT ovary also with flow pattern that is low resistance though with more limited assessment due to ovarian position. Apparently the patient had a "seizure" during the examination endovaginal scanning not performed due to reported discomfort and increased seizure activity with "penetration". Other: No fluid adjacent to the RIGHT adnexa. There was pain in the RIGHT lower quadrant by report during the examination. IMPRESSION: Mildly limited exam due to limitations outlined above with lack of endovaginal imaging. Transabdominal imaging without ovarian enlargement or fluid in the RIGHT adnexa and with normal low resistance flow to the LEFT ovary, limited assessment of flow pattern however on the RIGHT. If there is continued pain or concern for torsion repeat evaluation could be considered. Electronically Signed   By: Donzetta Kohut M.D.   On: 07/02/2021 10:48    Procedures Procedures   Medications Ordered in ED Medications  metoCLOPramide (REGLAN) injection 10 mg (10 mg Intravenous Given 07/02/21 0913)  diphenhydrAMINE (BENADRYL) injection 25 mg (25 mg Intravenous Given 07/02/21 0913)  sodium chloride 0.9 % bolus 500 mL (0 mLs Intravenous Stopped 07/02/21 1010)  LORazepam (ATIVAN) injection 0.5 mg (0.5 mg Intravenous Given 07/02/21 1013)  ketorolac (TORADOL) 15 MG/ML injection 15 mg (15 mg Intravenous Given 07/02/21 1212)  diphenhydrAMINE (BENADRYL) injection 25 mg (25 mg Intravenous Given 07/02/21 1213)  ED Course  I have reviewed the triage vital signs and the nursing notes.  Pertinent labs & imaging results that were available during my care of the patient were reviewed by me and considered in my medical decision making (see chart for details).    MDM  Rules/Calculators/A&P                           Patient presenting for evaluation of right lower abdominal pain, nausea, vomiting, diarrhea.  On exam, patient is nontoxic.  She reports sudden onset pain, and it is in the lower abdomen.  Consider ovarian torsion.  Also consider ovarian cyst, kidney stone, UTI, viral GI illness, PID/TOA.  Consider acute on chronic pelvic pain, endometriosis related pain.  Will obtain labs, urine, perform pelvic exam, and obtain pelvic ultrasound.  Will treat symptomatically.  Pelvic exam shows exquisite has tenderness of the entire exam, increased pain of the right adnexa.  Labs interpreted by me, overall reassuring.  No leukocytosis.  Urine shows small leukocytes, but no bacteria or white cells and there are 6-10 squamous cells.  As such, doubt UTI especially without urinary symptoms.  Wet prep shows white cells but no other positives.  This could be due to an STD, gonorrhea and Chlamydia are pending.   Pelvic US shows no torsion or obvious fluid collection.  On reevaluation, patient appears much more comfortable.  No further nausea or vomiting.  Tolerating p.o. without difficulty.  Discussed likely exacerbation of her chronic pelvic pain.  Discussed she could be having endometrial pain.  Although this could also be exacerbated by a viral GI illness resulting in nausea, vomiting, diarrhea.  Discussed continued symptomatic management, follow-up with PCP.  At this time, patient appears safe for discharge.  Return precautions given.  Patient states she understands and agrees to plan.  Final Clinical Impression(s) / ED Diagnoses Final diagnoses:  Pelvic pain  Right sided abdominal pain  Nausea vomiting and diarrhea    Rx / DC Orders ED Discharge Orders          Ordered    metoCLOPramide (REGLAN) 10 MG tablet  Every 8 hours PRN        07/02/21 1241             Alveria Apley, PA-C 07/02/21 1246    Jacalyn Lefevre, MD 07/03/21 [phone]

## 2021-07-02 NOTE — ED Notes (Signed)
Pt transported to CT via stretcher at this time.  

## 2021-07-02 NOTE — Discharge Instructions (Addendum)
Continue taking home medications as prescribed. Use Reglan as needed for nausea or vomiting. Follow-up with your primary care doctor and/or your OB/GYN as needed for further evaluation of your symptoms. Return to the emergency room with any new, worsening, concerning symptoms

## 2021-07-04 LAB — GC/CHLAMYDIA PROBE AMP (~~LOC~~) NOT AT ARMC
Chlamydia: POSITIVE — AB
Comment: NEGATIVE
Comment: NORMAL
Neisseria Gonorrhea: NEGATIVE

## 2021-07-05 ENCOUNTER — Other Ambulatory Visit: Payer: Self-pay

## 2021-07-05 ENCOUNTER — Emergency Department (HOSPITAL_COMMUNITY)
Admission: EM | Admit: 2021-07-05 | Discharge: 2021-07-05 | Disposition: A | Discharge status: Home / Self Care | Payer: Medicaid Other | Attending: Student | Admitting: Student

## 2021-07-05 ENCOUNTER — Other Ambulatory Visit: Payer: Self-pay | Admitting: Student in an Organized Health Care Education/Training Program

## 2021-07-05 ENCOUNTER — Encounter (HOSPITAL_COMMUNITY): Payer: Self-pay

## 2021-07-05 DIAGNOSIS — R112 Nausea with vomiting, unspecified: Secondary | ICD-10-CM | POA: Diagnosis not present

## 2021-07-05 DIAGNOSIS — R1030 Lower abdominal pain, unspecified: Secondary | ICD-10-CM | POA: Insufficient documentation

## 2021-07-05 DIAGNOSIS — Z5321 Procedure and treatment not carried out due to patient leaving prior to being seen by health care provider: Secondary | ICD-10-CM | POA: Insufficient documentation

## 2021-07-05 LAB — COMPREHENSIVE METABOLIC PANEL
ALT: 10 U/L (ref 0–44)
AST: 18 U/L (ref 15–41)
Albumin: 5.1 g/dL — ABNORMAL HIGH (ref 3.5–5.0)
Alkaline Phosphatase: 37 U/L — ABNORMAL LOW (ref 38–126)
Anion gap: 8 (ref 5–15)
BUN: 9 mg/dL (ref 6–20)
CO2: 27 mmol/L (ref 22–32)
Calcium: 10.2 mg/dL (ref 8.9–10.3)
Chloride: 104 mmol/L (ref 98–111)
Creatinine, Ser: 0.72 mg/dL (ref 0.44–1.00)
GFR, Estimated: >60 mL/min (ref >60)
Glucose, Bld: 80 mg/dL (ref 70–99)
Potassium: 3.7 mmol/L (ref 3.5–5.1)
Sodium: 139 mmol/L (ref 135–145)
Total Bilirubin: 0.8 mg/dL (ref 0.3–1.2)
Total Protein: 8 g/dL (ref 6.5–8.1)

## 2021-07-05 LAB — CBC WITH DIFFERENTIAL/PLATELET
Abs Immature Granulocytes: 0.01 10*3/uL (ref 0.00–0.07)
Basophils Absolute: 0 10*3/uL (ref 0.0–0.1)
Basophils Relative: 0 %
Eosinophils Absolute: 0.1 10*3/uL (ref 0.0–0.5)
Eosinophils Relative: 1 %
HCT: 43 % (ref 36.0–46.0)
Hemoglobin: 14.2 g/dL (ref 12.0–15.0)
Immature Granulocytes: 0 %
Lymphocytes Relative: 37 %
Lymphs Abs: 2.9 10*3/uL (ref 0.7–4.0)
MCH: 30.7 pg (ref 26.0–34.0)
MCHC: 33 g/dL (ref 30.0–36.0)
MCV: 92.9 fL (ref 80.0–100.0)
Monocytes Absolute: 0.6 10*3/uL (ref 0.1–1.0)
Monocytes Relative: 8 %
Neutro Abs: 4.2 10*3/uL (ref 1.7–7.7)
Neutrophils Relative %: 54 %
Platelets: 232 10*3/uL (ref 150–400)
RBC: 4.63 MIL/uL (ref 3.87–5.11)
RDW: 12.1 % (ref 11.5–15.5)
WBC: 7.8 10*3/uL (ref 4.0–10.5)
nRBC: 0 % (ref 0.0–0.2)

## 2021-07-05 LAB — I-STAT BETA HCG BLOOD, ED (MC, WL, AP ONLY): I-stat hCG, quantitative: <5 m[IU]/mL (ref <5)

## 2021-07-05 LAB — LIPASE, BLOOD: Lipase: 41 U/L (ref 11–51)

## 2021-07-05 NOTE — ED Provider Notes (Signed)
Emergency Medicine Provider Triage Evaluation Note  Abigail Harding , a 30 y.o. female  was evaluated in triage.  Pt complains of some lower abdominal pain and cramping as well as persistent nausea and vomiting.  She was seen on 8/20 for similar symptoms in the ED, vomiting was controlled with IV medications, had reassuring pelvic ultrasound at that time and STD testing was sent.  Patient reports she was called and told that she tested positive for chlamydia.  Due to her persistent and worsening symptoms today her doctor was concerned for possible PID and recommended she return to the ED. Marland Kitchen  Review of Systems  Positive: Abdominal pain, vomiting Negative: Fever, diarrhea, dysuria  Physical Exam  BP 112/73 (BP Location: Left Arm)   Pulse 91   Temp 99.5 F (37.5 C) (Oral)   Resp 14   SpO2 99%  Gen:   Awake, no distress   Resp:  Normal effort  MSK:   Moves extremities without difficulty  Other:  Mild tenderness across the lower abdomen without guarding or peritoneal signs  Medical Decision Making  Medically screening exam initiated at 6:41 PM.  Appropriate orders placed.  DAMEKA YOUNKER was informed that the remainder of the evaluation will be completed by another provider, this initial triage assessment does not replace that evaluation, and the importance of remaining in the ED until their evaluation is complete.     Dartha Lodge, PA-C 07/05/21 1858    Pollyann Savoy, MD 07/05/21 [phone]

## 2021-07-05 NOTE — ED Triage Notes (Signed)
Patient c/o lower abdominal pain sharp and cramping in nature. Patient went to her PCP today and states that she has chlamydia and her physician was concerned for PID. Patient also reports that she has been having frequent N/V

## 2021-10-22 ENCOUNTER — Emergency Department (HOSPITAL_COMMUNITY)
Admission: EM | Admit: 2021-10-22 | Discharge: 2021-10-22 | Disposition: A | Discharge status: Home / Self Care | Payer: Medicaid Other | Attending: Emergency Medicine | Admitting: Emergency Medicine

## 2021-10-22 DIAGNOSIS — D72829 Elevated white blood cell count, unspecified: Secondary | ICD-10-CM | POA: Insufficient documentation

## 2021-10-22 DIAGNOSIS — R569 Unspecified convulsions: Secondary | ICD-10-CM | POA: Diagnosis not present

## 2021-10-22 DIAGNOSIS — Z7951 Long term (current) use of inhaled steroids: Secondary | ICD-10-CM | POA: Insufficient documentation

## 2021-10-22 DIAGNOSIS — R110 Nausea: Secondary | ICD-10-CM | POA: Insufficient documentation

## 2021-10-22 DIAGNOSIS — J45909 Unspecified asthma, uncomplicated: Secondary | ICD-10-CM | POA: Insufficient documentation

## 2021-10-22 DIAGNOSIS — F445 Conversion disorder with seizures or convulsions: Secondary | ICD-10-CM

## 2021-10-22 LAB — CBC WITH DIFFERENTIAL/PLATELET
Abs Immature Granulocytes: 0.04 10*3/uL (ref 0.00–0.07)
Basophils Absolute: 0 10*3/uL (ref 0.0–0.1)
Basophils Relative: 0 %
Eosinophils Absolute: 0 10*3/uL (ref 0.0–0.5)
Eosinophils Relative: 0 %
HCT: 38.5 % (ref 36.0–46.0)
Hemoglobin: 12.7 g/dL (ref 12.0–15.0)
Immature Granulocytes: 0 %
Lymphocytes Relative: 9 %
Lymphs Abs: 1 10*3/uL (ref 0.7–4.0)
MCH: 30.4 pg (ref 26.0–34.0)
MCHC: 33 g/dL (ref 30.0–36.0)
MCV: 92.1 fL (ref 80.0–100.0)
Monocytes Absolute: 0.9 10*3/uL (ref 0.1–1.0)
Monocytes Relative: 8 %
Neutro Abs: 9.6 10*3/uL — ABNORMAL HIGH (ref 1.7–7.7)
Neutrophils Relative %: 83 %
Platelets: 234 10*3/uL (ref 150–400)
RBC: 4.18 MIL/uL (ref 3.87–5.11)
RDW: 11.9 % (ref 11.5–15.5)
WBC: 11.7 10*3/uL — ABNORMAL HIGH (ref 4.0–10.5)
nRBC: 0 % (ref 0.0–0.2)

## 2021-10-22 LAB — COMPREHENSIVE METABOLIC PANEL
ALT: 11 U/L (ref 0–44)
AST: 23 U/L (ref 15–41)
Albumin: 4.2 g/dL (ref 3.5–5.0)
Alkaline Phosphatase: 39 U/L (ref 38–126)
Anion gap: 12 (ref 5–15)
BUN: 14 mg/dL (ref 6–20)
CO2: 22 mmol/L (ref 22–32)
Calcium: 8.9 mg/dL (ref 8.9–10.3)
Chloride: 105 mmol/L (ref 98–111)
Creatinine, Ser: 0.91 mg/dL (ref 0.44–1.00)
GFR, Estimated: >60 mL/min (ref >60)
Glucose, Bld: 80 mg/dL (ref 70–99)
Potassium: 4.1 mmol/L (ref 3.5–5.1)
Sodium: 139 mmol/L (ref 135–145)
Total Bilirubin: 0.9 mg/dL (ref 0.3–1.2)
Total Protein: 6.8 g/dL (ref 6.5–8.1)

## 2021-10-22 LAB — MAGNESIUM: Magnesium: 2 mg/dL (ref 1.7–2.4)

## 2021-10-22 LAB — URINALYSIS, ROUTINE W REFLEX MICROSCOPIC
Bilirubin Urine: NEGATIVE
Glucose, UA: NEGATIVE mg/dL
Hgb urine dipstick: NEGATIVE
Ketones, ur: 80 mg/dL — AB
Nitrite: NEGATIVE
Protein, ur: NEGATIVE mg/dL
Specific Gravity, Urine: 1.01 (ref 1.005–1.030)
pH: 6 (ref 5.0–8.0)

## 2021-10-22 LAB — CBG MONITORING, ED: Glucose-Capillary: 77 mg/dL (ref 70–99)

## 2021-10-22 LAB — RAPID URINE DRUG SCREEN, HOSP PERFORMED
Amphetamines: NOT DETECTED
Barbiturates: NOT DETECTED
Benzodiazepines: NOT DETECTED
Cocaine: NOT DETECTED
Opiates: NOT DETECTED
Tetrahydrocannabinol: POSITIVE — AB

## 2021-10-22 LAB — ETHANOL: Alcohol, Ethyl (B): <10 mg/dL (ref <10)

## 2021-10-22 LAB — I-STAT BETA HCG BLOOD, ED (MC, WL, AP ONLY): I-stat hCG, quantitative: <5 m[IU]/mL (ref <5)

## 2021-10-22 MED ORDER — LACTATED RINGERS IV BOLUS
1000.0000 mL | Freq: Once | INTRAVENOUS | Status: AC
  Administered 2021-10-22: 1000 mL via INTRAVENOUS

## 2021-10-22 MED ORDER — LORAZEPAM 2 MG/ML IJ SOLN: 4.0000 mg | INTRAMUSCULAR | Status: DC | PRN

## 2021-10-22 MED ORDER — SODIUM CHLORIDE 0.9 % IV SOLN
12.5000 mg | Freq: Four times a day (QID) | INTRAVENOUS | Status: DC | PRN
  Administered 2021-10-22: 12.5 mg via INTRAVENOUS
  Filled 2021-10-22: qty 12.5
  Filled 2021-10-22: qty 0.5

## 2021-10-22 MED ORDER — LAMOTRIGINE 25 MG PO TABS
75.0000 mg | ORAL_TABLET | Freq: Once | ORAL | Status: AC
  Administered 2021-10-22: 75 mg via ORAL
  Filled 2021-10-22: qty 3

## 2021-10-22 NOTE — Discharge Instructions (Signed)
You were seen in the ER today for your seizure-like activity.  Your blood work was very reassuring as was your physical exam.  You were evaluated by the neurologist. Please proceed with your outpatient EEG tomorrow as previously scheduled and follow-up closely with your outpatient neurologist.  Please continue to take your prescribed medications and return to the ER with any new severe symptoms.

## 2021-10-22 NOTE — ED Provider Notes (Signed)
[facility] EMERGENCY DEPARTMENT Provider Note   CSN: [id number] Arrival date & time: 10/22/21  1511     History Chief Complaint  Patient presents with   Seizures   Fifth Disease    Abigail Harding is a 30 y.o. 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 Malvin Johns at [place] [facility] who presents today with report of approximately 30 seizure-like episodes starting at 230 this morning and continuing throughout the late morning. According to the patient she was drinking alcohol last night with her friends when she experienced her first seizure-like episode.  This was described by the patient's mother as straightening and stiffening of her arms, shaking of her whole body and extension of the patient's neck.  The patient states that typically she can remember her episodes but cannot remember the episodes throughout the evening yesterday.  The patient's mother the bedside says the EMS was initially called to the bar where the patient had her first episode but the patient refused transport to the hospital.  According to the patient's mother she then had approximately 30 episodes of activity very similar to her initial episode throughout the morning.  She states that they would last 10 seconds to 60 seconds with approximately 5 minutes in between and then the patient would be oriented again for immediately following the episode.  Of note the patient is on daily Phenergan for chronic nausea reportedly secondary to her endometriosis.  She is scheduled for outpatient EEG tomorrow with provider from [facility] in [place].  I personally reviewed this patient's medical records.  She is also has history of migraines, anxiety, PCOS and endometriosis well the left pelvic floor dysfunction with history of PID.  Topiramate, Keppra, zonisamide and Depakote were  avoided by neurology due to patient's desire for future fertility.  EEG performed normal in August 2022 in the inpatient setting during which evaluation she had multiple of her seizure-like episodes without any seizure or epileptiform changes on her EEG throughout that time.  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   Nausea with vomiting 06/13/2021   Syncope 06/13/2021   Health care maintenance 04/12/2018   Endometriosis determined by laparoscopy 01/02/2017   Transient loss of consciousness 12/31/2016   Headache 12/21/2016   Right lower quadrant abdominal pain 10/31/2016   Family history of endometriosis in first degree relative 10/31/2016   Round ligament pain 10/31/2016   Dysmenorrhea 10/31/2016   Pelvic pain 10/31/2016   Chronic pelvic pain in female 03/16/2016   Generalized anxiety disorder 03/02/2016   Oligo-ovulation 11/17/2015    Past Surgical History:  Procedure Laterality Date   DILATION AND EVACUATION N/A 02/19/2017   Procedure: DILATATION AND EVACUATION;  Surgeon: Herold Harms, MD;  Location: [facility] ORS;  Service: Gynecology;  Laterality: N/A;   LAPAROSCOPY     LAPAROSCOPY N/A 12/25/2016   Procedure: LAPAROSCOPY DIAGNOSTIC WITH BIOPSIES;  Surgeon: Daphine Deutscher  A Defrancesco, MD;  Location: [facility] ORS;  Service: Gynecology;  Laterality: N/A;   TONSILLECTOMY       OB History     Gravida  2   Para  0   Term  0   Preterm  0   AB  1   Living  0      SAB  1   IAB  0   Ectopic  0   Multiple  0   Live Births  0           Family History  Problem Relation Age of Onset   Diabetes  Mother    Diabetes Maternal Grandmother    Heart disease Maternal Grandmother        GRT ALSO   Breast cancer Paternal Grandmother        GRT   Prostate cancer Maternal Uncle    Ovarian cancer Neg Hx    Colon cancer Neg Hx     Social History   Tobacco Use   Smoking status: Never   Smokeless tobacco: Never  Vaping Use   Vaping Use: Every day   Substances: Nicotine, Flavoring  Substance Use Topics   Alcohol use: Yes   Drug use: Yes    Types: Marijuana    Comment: DAILY- X 4 YEARS    Home Medications Prior to Admission medications   Medication Sig Start Date End Date Taking? Authorizing Provider  albuterol (VENTOLIN HFA) 108 (90 Base) MCG/ACT inhaler Inhale 1-2 puffs into the lungs as needed for shortness of breath. 05/15/21   [provider]  celecoxib (CELEBREX) 200 MG capsule Take 200 mg by mouth as needed for moderate pain. 03/16/21   [provider]  clonazePAM (KLONOPIN) 0.5 MG tablet Take 0.5 mg by mouth as needed for anxiety. 06/01/21   [provider]  cyclobenzaprine (FLEXERIL) 5 MG tablet Take 5 mg by mouth 3 (three) times daily as needed for muscle spasms. 03/16/21   [provider]  Drospirenone (SLYND PO) Take 1 tablet by mouth daily. Pt skips placebo pills and continues to next pack in order to skip period    [provider]  DULoxetine (CYMBALTA) 30 MG capsule Take 1 capsule (30 mg total) by mouth daily. Patient not taking: No sig reported 06/16/21 07/16/21  Carmel Sacramento, MD  DULoxetine HCl 40 MG CPEP Take 1 capsule by mouth daily. 06/28/21   [provider]  fluticasone (FLONASE) 50 MCG/ACT nasal spray Place 1 spray into the nose daily as needed.    [provider]  hydrOXYzine (ATARAX/VISTARIL) 25 MG tablet Take 1 tablet (25 mg total) by mouth 2 (two) times daily as needed for itching or anxiety. Patient not taking: No sig reported 06/15/21   Tyson Alias, MD  hydrOXYzine (ATARAX/VISTARIL) 50 MG tablet  Take 1 tablet (50 mg total) by mouth at bedtime. Patient not taking: No sig reported 06/15/21   Tyson Alias, MD  metoCLOPramide (REGLAN) 10 MG tablet Take 1 tablet (10 mg total) by mouth every 8 (eight) hours as needed for nausea. 07/02/21   Caccavale, Sophia, PA-C  Prenat w/o A Vit-FeFum-FePo-FA (CONCEPT OB) 130-92.4-1 MG CAPS Take 1 capsule by mouth daily. Patient not taking: Reported on 07/02/2021 10/15/17   Hildred Laser, MD  promethazine (PHENERGAN) 12.5 MG tablet Take 1 tablet (12.5 mg total) by mouth every 6 (six) hours as needed for nausea or vomiting. 06/15/21   Verdene Lennert, MD  tiZANidine (ZANAFLEX) 4 MG tablet Take 2 mg  by mouth every 8 (eight) hours as needed. 04/21/21   [provider]    Allergies    Hydrocodone, Ketorolac, Morphine and related, Tramadol, Betamethasone, and Hydroxyzine  Review of Systems   Review of Systems  Constitutional: Negative.   HENT: Negative.    Eyes: Negative.   Respiratory: Negative.    Cardiovascular: Negative.   Gastrointestinal:  Positive for nausea. Negative for abdominal distention, abdominal pain, anal bleeding, blood in stool, constipation, diarrhea and vomiting.  Genitourinary: Negative.   Musculoskeletal: Negative.   Skin: Negative.   Neurological:  Positive for seizures. Negative for tremors, syncope and weakness.  Hematological: Negative.   Psychiatric/Behavioral: Negative.     Physical Exam Updated Vital Signs BP (!) 97/40   Pulse 67   Temp 99 F (37.2 C) (Oral)   Resp (!) 21   SpO2 99%   Physical Exam Vitals and nursing note reviewed.  Constitutional:      Appearance: She is not ill-appearing or toxic-appearing.  HENT:     Head: Normocephalic and atraumatic.     Nose: Nose normal. No congestion.     Mouth/Throat:     Mouth: Mucous membranes are moist.     Pharynx: Oropharynx is clear. Uvula midline. No oropharyngeal exudate or posterior oropharyngeal erythema.     Tonsils: No tonsillar exudate.  Eyes:      General: Lids are normal. Vision grossly intact.        Right eye: No discharge.        Left eye: No discharge.     Extraocular Movements: Extraocular movements intact.     Conjunctiva/sclera: Conjunctivae normal.     Pupils: Pupils are equal, round, and reactive to light.  Neck:     Trachea: Trachea and phonation normal.  Cardiovascular:     Rate and Rhythm: Normal rate and regular rhythm.     Pulses: Normal pulses.     Heart sounds: Normal heart sounds. No murmur heard. Pulmonary:     Effort: Pulmonary effort is normal. No respiratory distress.     Breath sounds: Normal breath sounds. No wheezing or rales.  Chest:     Chest wall: No mass, lacerations, deformity, swelling, tenderness, crepitus or edema.  Abdominal:     General: Bowel sounds are normal. There is no distension.     Palpations: Abdomen is soft.     Tenderness: There is no abdominal tenderness. There is no right CVA tenderness, left CVA tenderness, guarding or rebound.  Musculoskeletal:        General: No deformity.     Cervical back: Normal range of motion and neck supple. No tenderness.     Right lower leg: No edema.     Left lower leg: No edema.  Lymphadenopathy:     Cervical: No cervical adenopathy.  Skin:    General: Skin is warm and dry.     Capillary Refill: Capillary refill takes less than 2 seconds.     Findings: No rash.  Neurological:     General: No focal deficit present.     Mental Status: She is alert and oriented to person, place, and time. Mental status is at baseline.     GCS: GCS eye subscore is 4. GCS verbal subscore is 5. GCS motor subscore is 6.     Cranial Nerves: Cranial nerves 2-12 are intact.     Sensory: Sensation is intact.     Motor: Motor function is intact. No seizure activity.     Coordination: Coordination is  intact.     Gait: Gait is intact. Gait normal.  Psychiatric:        Mood and Affect: Mood normal.    ED Results / Procedures / Treatments   Labs (all labs ordered  are listed, but only abnormal results are displayed) Labs Reviewed  CBC WITH DIFFERENTIAL/PLATELET - Abnormal; Notable for the following components:      Result Value   WBC 11.7 (*)    Neutro Abs 9.6 (*)    All other components within normal limits  URINALYSIS, ROUTINE W REFLEX MICROSCOPIC - Abnormal; Notable for the following components:   APPearance HAZY (*)    Ketones, ur 80 (*)    Leukocytes,Ua TRACE (*)    Bacteria, UA MANY (*)    All other components within normal limits  RAPID URINE DRUG SCREEN, HOSP PERFORMED - Abnormal; Notable for the following components:   Tetrahydrocannabinol POSITIVE (*)    All other components within normal limits  COMPREHENSIVE METABOLIC PANEL  MAGNESIUM  ETHANOL  CBG MONITORING, ED  I-STAT BETA HCG BLOOD, ED ([facility], [facility], [facility] ONLY)    EKG None  Radiology No results found.  Procedures Procedures   Medications Ordered in ED Medications  promethazine (PHENERGAN) 12.5 mg in sodium chloride 0.9 % 50 mL IVPB (0 mg Intravenous Stopped 10/22/21 2119)  lactated ringers bolus 1,000 mL (0 mLs Intravenous Stopped 10/22/21 1733)  lactated ringers bolus 1,000 mL (0 mLs Intravenous Stopped 10/22/21 2000)  lamoTRIgine (LAMICTAL) tablet 75 mg (75 mg Oral Given 10/22/21 2155)    ED Course  I have reviewed the triage vital signs and the nursing notes.  Pertinent labs & imaging results that were available during my care of the patient were reviewed by me and considered in my medical decision making (see chart for details).  Clinical Course as of 10/22/21 2357  Sat Oct 22, 2021  1812 Consult to neurologist, Dr. Derry Lory, who will review the patient's outpatient neurology notes and call me back with further recommendations.  I do appreciate cooperation in the care of this patient. [RS]  1901 Per neurology, Dr. Amada Jupiter will come evaluate the patient at the bedside. [RS]  1931 Ambulated to the bathroom with ED tech; was able to ambulate independently though  slowly and states that she felt quite lightheaded after walking to the bathroom. [RS]    Clinical Course User Index [RS] Karista Aispuro, Eugene Gavia, PA-C   MDM Rules/Calculators/A&P                         30 year old female presents with concern for seizure-like episodes throughout the morning.  History of seizure-like activities.  Differential diagnosis for seizure-like activity includes but is not limited to nonepileptic seizures, pseudoseizures, metabolic derangement, drug effect.  Vital signs are normal and intake.  Cardiopulmonary exam is normal, abdominal exam is benign.  Patient is neurovascular intact in upper extremities and nonfocal on her neurologic exam.   CBC with mild leukocytosis of 11.7, CMP unremarkable, electrolytes are normal.  CBG with glucose of 77, UA without signs of infection.  Patient is not pregnant, alcohol level is normal and there is only tetrahydrocannabinol aids on her UDS.  Consult to neurology as above.  Patient was evaluated at the bedside with Dr. Amada Jupiter who feels she is safe to be discharged home especially considering outpatient EEG and neuro follow-up scheduled for tomorrow.  Recommends continuation of her home Lamictal dosing and strict return precautions.  No further work-up warranted near  this time.  Patient voiced understanding of her medical evaluation and treatment plan.  Each of her questions was answered to her expressed satisfaction.  Return precautions were given.  Patient is well-appearing, stable, and appropriate for discharge at this time.  This chart was dictated using voice recognition software, Dragon. Despite the best efforts of this provider to proofread and correct errors, errors may still occur which can change documentation meaning.  Final Clinical Impression(s) / ED Diagnoses Final diagnoses:  Seizure-like activity [facility])    Rx / DC Orders ED Discharge Orders     None        Paris Lore, PA-C 10/22/21 2357     Sloan Leiter, DO 10/25/21 [phone]

## 2021-10-22 NOTE — Consult Note (Signed)
Neurology Consultation Reason for Consult: Episodes Referring Physician: Gwenette Greet  CC: Episodes  History is obtained from: Patient, mother  HPI: Abigail Harding is a 30 y.o. female with a history of episodes that have been concerning for seizure versus syncope versus psychogenic episodes.  She reports her first episode as occurring in middle school, but this was an isolated event.  She had a transient confusional episode associated with vision loss.  She was taken off the bus and her parents had to come get her.  She then had no further episodes or concerns until May 2019 when shortly after she delivered her child she transiently lost consciousness. She was out for about a minute.   In July of this year, she began having a different type of episode.  She has had extensive work-up, and was admitted to the internal medicine service in early August.  These episodes were not felt to be consistent with syncope.  She had an EEG which recorded multiple spells which were her typical episodes.  There is no EEG change to suggest either syncope or seizure on the EEG.  Following that hospitalization, she continued to have the spells daily, sometimes multiple times per day for about a month and then in early September they simply stopped.  She was then episode free until last night when she was out with some friends.  She had had a couple of drinks, but was not drunk.  She had used marijuana, but it was typical marijuana, no concerns for it being laced or using synthetic substitutes.  Then in the evening, she began having episodes.  She describes that she would lose consciousness, sometimes would lose vision.  She had symptoms feel dizzy beforehand and sometimes not.  She remembers reaching out and grabbing things she would not normally grab.  She he does have periods of the night though that she is amnestic to.  When she came home, she was nauseated, had recurrent episodes of low-volume spit up.  Her mother got  her to come inside and finally put on her pajamas and go to bed, which the patient states she does not remember doing.  Today she woke up around 1 PM, then felt her entire body, both sides become numb.  She also had bilateral shaking with preserved consciousness.  Due to this that she called EMS and had two further episodes while being transported, but none now in the past 6 hours or so.  She was concerned that she had some stiffening associated with these episodes which she has not had in the past, but otherwise they were typical for her previous episodes.   She has had a mild URI type infection in the past 2 weeks, but otherwise no recent symptoms or changes in medications     ROS: A 14 point ROS was performed and is negative except as noted in the HPI.  Past Medical History:  Diagnosis Date   ADHD (attention deficit hyperactivity disorder)    Anemia    Anxiety    Asthma    Back pain    Endometriosis    Headache    History of fainting    History of kidney stones    In the past   Ovarian cyst    PID (pelvic inflammatory disease)    Seizures (HCC)      Family History  Problem Relation Age of Onset   Diabetes Mother    Diabetes Maternal Grandmother    Heart disease Maternal Grandmother  GRT ALSO   Breast cancer Paternal Grandmother        GRT   Prostate cancer Maternal Uncle    Ovarian cancer Neg Hx    Colon cancer Neg Hx      Social History:  reports that she has never smoked. She has never used smokeless tobacco. She reports current alcohol use. She reports current drug use. Drug: Marijuana.   Exam: Current vital signs: BP 111/64   Pulse 76   Temp 98.8 F (37.1 C) (Oral)   Resp 19   SpO2 99%  Vital signs in last 24 hours: Temp:  [98.8 F (37.1 C)] 98.8 F (37.1 C) (12/10 1520) Pulse Rate:  [76-96] 76 (12/10 1948) Resp:  [13-21] 19 (12/10 1948) BP: (92-113)/(41-71) 111/64 (12/10 1945) SpO2:  [99 %-100 %] 99 % (12/10 1948)   Physical Exam   Constitutional: Appears well-developed and well-nourished.  Psych: Affect appropriate to situation Eyes: No scleral injection HENT: No OP obstruction MSK: no joint deformities.  Cardiovascular: Normal rate and regular rhythm.  Respiratory: Effort normal, non-labored breathing GI: Soft.  No distension. There is no tenderness.  Skin: WDI  Neuro: Mental Status: Patient is awake, alert, oriented to person, place, month, year, and situation. Patient is able to give a clear and coherent history. No signs of aphasia or neglect Cranial Nerves: II: Visual Fields are full. Pupils are equal, round, and reactive to light.   III,IV, VI: EOMI without ptosis or diploplia.  V: Facial sensation is symmetric to temperature VII: Facial movement is symmetric.  VIII: hearing is intact to voice X: Uvula elevates symmetrically XI: Shoulder shrug is symmetric. XII: tongue is midline without atrophy or fasciculations.  Motor: Tone is normal. Bulk is normal. 5/5 strength was present in all four extremities.  Sensory: Sensation is symmetric to light touch and temperature in the arms and legs. Cerebellar: FNF and HKS are intact bilaterally    I have reviewed labs in epic and the results pertinent to this consultation are: BMP-unremarkable Mild leukocytosis  I have reviewed the images obtained: MRI brain from 7/31-normal  Impression: 30 year old female with a history of recurrent episodes since July.  Given that her typical episode was recorded on EEG, coupled with the fact that the description of bilateral shaking with preserved consciousness is much more consistent with psychogenic etiology, I am not certain that I would change management at this time.  I did discuss with her, that it would not be unreasonable to observe her to get an EEG expedited.  She does have an EEG scheduled for tomorrow, and given that she was having the spells daily at home for over a month and a half, I am not sure that we  would really change her management by admitting her at this time, also the 5 days she has now been spell free for 70 hours it is reassuring.  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  I encouraged her to consider seeking psychological counseling to help identify possible contributing factors.  Recommendations: 1) encouraged her to keep her outpatient EEG appointment 2) continue lamotrigine 75 mg daily, will give dose prior to leaving 3) follow-up with outpatient neurology 4) consider seeking outpatient psychotherapist   Ritta Slot, MD [facility] [phone]  If 7pm- 7am, please page neurology on call as listed in [facility].

## 2021-10-22 NOTE — ED Notes (Signed)
Pt walked to the bathroom without assistance.  

## 2021-10-22 NOTE — ED Triage Notes (Signed)
Pt BIB [facility] from home c/o seizures. Couple of seizures last night witnessed by friends. Per EMS, pt's mom also reported she had a seizure this morning. A&O X4.   NS given by EMS  4mg  zofran   Sinus tach 140 intimally, then back to normal  85  HR 85

## 2022-02-19 ENCOUNTER — Emergency Department (HOSPITAL_COMMUNITY): Payer: Medicaid Other

## 2022-02-19 ENCOUNTER — Encounter (HOSPITAL_COMMUNITY): Payer: Self-pay | Admitting: Emergency Medicine

## 2022-02-19 ENCOUNTER — Inpatient Hospital Stay (HOSPITAL_COMMUNITY)
Admission: EM | Admit: 2022-02-19 | Discharge: 2022-02-22 | DRG: 552 | Disposition: A | Discharge status: Home / Self Care | Payer: Medicaid Other | Attending: Internal Medicine | Admitting: Internal Medicine

## 2022-02-19 DIAGNOSIS — M5459 Other low back pain: Secondary | ICD-10-CM

## 2022-02-19 DIAGNOSIS — M5410 Radiculopathy, site unspecified: Secondary | ICD-10-CM | POA: Diagnosis present

## 2022-02-19 DIAGNOSIS — M5126 Other intervertebral disc displacement, lumbar region: Principal | ICD-10-CM | POA: Diagnosis present

## 2022-02-19 DIAGNOSIS — Z885 Allergy status to narcotic agent status: Secondary | ICD-10-CM

## 2022-02-19 DIAGNOSIS — F17290 Nicotine dependence, other tobacco product, uncomplicated: Secondary | ICD-10-CM | POA: Diagnosis present

## 2022-02-19 DIAGNOSIS — Z888 Allergy status to other drugs, medicaments and biological substances status: Secondary | ICD-10-CM

## 2022-02-19 DIAGNOSIS — F909 Attention-deficit hyperactivity disorder, unspecified type: Secondary | ICD-10-CM | POA: Diagnosis present

## 2022-02-19 DIAGNOSIS — Z79899 Other long term (current) drug therapy: Secondary | ICD-10-CM

## 2022-02-19 DIAGNOSIS — Z20822 Contact with and (suspected) exposure to covid-19: Secondary | ICD-10-CM | POA: Diagnosis present

## 2022-02-19 DIAGNOSIS — E876 Hypokalemia: Secondary | ICD-10-CM | POA: Diagnosis present

## 2022-02-19 DIAGNOSIS — Z87442 Personal history of urinary calculi: Secondary | ICD-10-CM

## 2022-02-19 DIAGNOSIS — M5430 Sciatica, unspecified side: Secondary | ICD-10-CM | POA: Diagnosis present

## 2022-02-19 DIAGNOSIS — F411 Generalized anxiety disorder: Secondary | ICD-10-CM | POA: Diagnosis present

## 2022-02-19 DIAGNOSIS — J45909 Unspecified asthma, uncomplicated: Secondary | ICD-10-CM | POA: Diagnosis present

## 2022-02-19 DIAGNOSIS — R55 Syncope and collapse: Secondary | ICD-10-CM | POA: Diagnosis present

## 2022-02-19 DIAGNOSIS — N739 Female pelvic inflammatory disease, unspecified: Secondary | ICD-10-CM | POA: Diagnosis present

## 2022-02-19 DIAGNOSIS — M5431 Sciatica, right side: Secondary | ICD-10-CM | POA: Diagnosis present

## 2022-02-19 DIAGNOSIS — Z7952 Long term (current) use of systemic steroids: Secondary | ICD-10-CM

## 2022-02-19 LAB — I-STAT BETA HCG BLOOD, ED (MC, WL, AP ONLY): I-stat hCG, quantitative: <5 m[IU]/mL (ref <5)

## 2022-02-19 MED ORDER — FENTANYL CITRATE PF 50 MCG/ML IJ SOSY
50.0000 ug | PREFILLED_SYRINGE | Freq: Once | INTRAMUSCULAR | Status: AC
Start: 2022-02-19 — End: 2022-02-19
  Administered 2022-02-19: 50 ug via INTRAVENOUS
  Filled 2022-02-19: qty 1

## 2022-02-19 NOTE — ED Provider Notes (Signed)
?[place] ?Provider Note ? ? ?CSN: [id number] ?Arrival date & time: 02/19/22  2152 ? ?  ? ?History ? ?Chief Complaint  ?Patient presents with  ? Back Pain  ? ? ?Abigail Harding is a 31 y.o. female. ? ?The history is provided by the patient. No language interpreter was used.  ?Back Pain ? ?31 year old female significant history of back pain, anxiety, ovarian cyst, was brought here via EMS from home for evaluation of back pain.  Patient report she was receiving a massage from her significant other prior to arrival.  Patient states she was very relaxed during the massage but when she turns she experienced acute onset of intense sharp stabbing pain to her lower back radiates down to her right leg towards her toe.  Pain is severe, worsening with any kind of movement and did improve when she lays in a specific position.  She mentions she has history of L5 herniated disc status post back surgery last year.  She denies any fever, bowel bladder incontinence or saddle anesthesia.  No history of IV drug use.  No abdominal pain.  She did endorse near syncope due to pain. ? ?Home Medications ?Prior to Admission medications   ?Medication Sig Start Date End Date Taking? Authorizing Provider  ?albuterol (VENTOLIN HFA) 108 (90 Base) MCG/ACT inhaler Inhale 1-2 puffs into the lungs as needed for shortness of breath. 05/15/21   [provider]  ?celecoxib (CELEBREX) 200 MG capsule Take 200 mg by mouth as needed for moderate pain. 03/16/21   [provider]  ?clonazePAM (KLONOPIN) 0.5 MG tablet Take 0.5 mg by mouth as needed for anxiety. 06/01/21   [provider]  ?cyclobenzaprine (FLEXERIL) 5 MG tablet Take 5 mg by mouth 3 (three) times daily as needed for muscle spasms. 03/16/21   [provider]  ?Drospirenone (SLYND PO) Take 1 tablet by mouth daily. Pt skips placebo pills and continues to next pack in order to skip period    [provider]  ?DULoxetine  (CYMBALTA) 30 MG capsule Take 1 capsule (30 mg total) by mouth daily. ?Patient not taking: No sig reported 06/16/21 07/16/21  Lajean Manes, MD  ?DULoxetine HCl 40 MG CPEP Take 1 capsule by mouth daily. 06/28/21   [provider]  ?fluticasone (FLONASE) 50 MCG/ACT nasal spray Place 1 spray into the nose daily as needed.    [provider]  ?hydrOXYzine (ATARAX/VISTARIL) 25 MG tablet Take 1 tablet (25 mg total) by mouth 2 (two) times daily as needed for itching or anxiety. ?Patient not taking: No sig reported 06/15/21   Axel Filler, MD  ?hydrOXYzine (ATARAX/VISTARIL) 50 MG tablet Take 1 tablet (50 mg total) by mouth at bedtime. ?Patient not taking: No sig reported 06/15/21   Axel Filler, MD  ?metoCLOPramide (REGLAN) 10 MG tablet Take 1 tablet (10 mg total) by mouth every 8 (eight) hours as needed for nausea. 07/02/21   Caccavale, Sophia, PA-C  ?Prenat w/o A Vit-FeFum-FePo-FA (CONCEPT OB) 130-92.4-1 MG CAPS Take 1 capsule by mouth daily. ?Patient not taking: Reported on 07/02/2021 10/15/17   Rubie Maid, MD  ?promethazine (PHENERGAN) 12.5 MG tablet Take 1 tablet (12.5 mg total) by mouth every 6 (six) hours as needed for nausea or vomiting. 06/15/21   Jose Persia, MD  ?tiZANidine (ZANAFLEX) 4 MG tablet Take 2 mg by mouth every 8 (eight) hours as needed. 04/21/21   [provider]  ?   ? ?Allergies    ?Hydrocodone,  Ketorolac, Morphine and related, Tramadol, Betamethasone, and Hydroxyzine   ? ?Review of Systems   ?Review of Systems  ?Musculoskeletal:  Positive for back pain.  ?All other systems reviewed and are negative. ? ?Physical Exam ?Updated Vital Signs ?BP (!) 100/59 (BP Location: Right Arm)   Pulse 85   Temp 98.7 ?F (37.1 ?C) (Oral)   Resp 16   Ht 5\' 4"  (1.626 m)   Wt 56.7 kg   SpO2 99%   BMI 21.46 kg/m?  ?Physical Exam ?Vitals and nursing note reviewed.  ?Constitutional:   ?   General: She is not in acute distress. ?   Appearance: She is well-developed.  ?   Comments:  Patient is laying in a left lateral decubitus position appears to be in no acute discomfort  ?HENT:  ?   Head: Atraumatic.  ?Eyes:  ?   Conjunctiva/sclera: Conjunctivae normal.  ?Pulmonary:  ?   Effort: Pulmonary effort is normal.  ?Abdominal:  ?   Palpations: Abdomen is soft.  ?   Tenderness: There is no abdominal tenderness.  ?Musculoskeletal:     ?   General: Tenderness (Lumbar spine: Exquisite tenderness to palpation at the level of L5-S1 without any crepitus or step-off noted.  Positive right straight leg raise.  Intact distal pedal pulse.) present.  ?   Cervical back: Neck supple.  ?Skin: ?   Capillary Refill: Capillary refill takes less than 2 seconds.  ?   Findings: No rash.  ?Neurological:  ?   Mental Status: She is alert.  ?Psychiatric:     ?   Mood and Affect: Mood normal.  ? ? ?ED Results / Procedures / Treatments   ?Labs ?(all labs ordered are listed, but only abnormal results are displayed) ?Labs Reviewed  ?URINALYSIS, ROUTINE W REFLEX MICROSCOPIC - Abnormal; Notable for the following components:  ?    Result Value  ? APPearance CLOUDY (*)   ? pH 9.0 (*)   ? All other components within normal limits  ?I-STAT CHEM 8, ED - Abnormal; Notable for the following components:  ? Potassium 3.4 (*)   ? Glucose, Bld 126 (*)   ? All other components within normal limits  ?RESP PANEL BY RT-PCR (FLU A&B, COVID) [id number]  ?CBC WITH DIFFERENTIAL/PLATELET  ?I-STAT BETA HCG BLOOD, ED ([facility], [facility], [facility] ONLY)  ? ? ?EKG ?None ? ?Radiology ?DG Lumbar Spine Complete ? ?Result Date: 02/20/2022 ?CLINICAL DATA:  Back pain EXAM: LUMBAR SPINE - COMPLETE 4+ VIEW COMPARISON:  CT 06/12/2021 FINDINGS: There is no evidence of lumbar spine fracture. Alignment is normal. Intervertebral disc spaces are maintained. IMPRESSION: Negative. Electronically Signed   By: Donavan Foil M.D.   On: 02/20/2022 00:29  ? ?MR LUMBAR SPINE WO CONTRAST ? ?Result Date: 02/20/2022 ?CLINICAL DATA:  Trauma with low back pain EXAM: MRI LUMBAR SPINE WITHOUT CONTRAST  TECHNIQUE: Multiplanar, multisequence MR imaging of the lumbar spine was performed. No intravenous contrast was administered. COMPARISON:  None. FINDINGS: Segmentation:  Standard. Alignment:  Physiologic. Vertebrae:  No fracture, evidence of discitis, or bone lesion. Conus medullaris and cauda equina: Conus extends to the L1 level. Conus and cauda equina appear normal. Paraspinal and other soft tissues: Negative. Disc levels: L1-L2: Normal disc space and facet joints. No spinal canal stenosis. No neural foraminal stenosis. L2-L3: Normal disc space and facet joints. No spinal canal stenosis. No neural foraminal stenosis. L3-L4: Normal disc space and facet joints. No spinal canal stenosis. No neural foraminal stenosis. L4-L5: Medium-sized central disc extrusion with  inferior migration causing narrowing of the right lateral recess with displacement of the right L5 nerve root. No central spinal canal stenosis. No neural foraminal stenosis. L5-S1: Normal disc space and facet joints. No spinal canal stenosis. No neural foraminal stenosis. Visualized sacrum: Normal. IMPRESSION: 1. Medium-sized central disc extrusion with inferior migration at L4-L5 causing narrowing of the right lateral recess with displacement of the right L5 nerve root. Correlate for right L5 radiculopathy. 2. Otherwise normal lumbar spine. Electronically Signed   By: Ulyses Jarred M.D.   On: 02/20/2022 03:44   ? ?Procedures ?Marland KitchenCritical Care ?Performed by: Domenic Moras, PA-C ?Authorized by: Domenic Moras, PA-C  ? ?Critical care provider statement:  ?  Critical care time (minutes):  37 ?  Critical care was time spent personally by me on the following activities:  Development of treatment plan with patient or surrogate, discussions with consultants, evaluation of patient's response to treatment, examination of patient, ordering and review of laboratory studies, ordering and review of radiographic studies, ordering and performing treatments and interventions,  pulse oximetry, re-evaluation of patient's condition and review of old charts  ? ? ?Medications Ordered in ED ?Medications  ?enoxaparin (LOVENOX) injection 40 mg (has no administration in time range)  ?0.9 %  sod

## 2022-02-19 NOTE — ED Triage Notes (Signed)
Pt bib EMS from home for back pain and near-syncopal episode (due to pain) and brief episode of decreased sensation in all extremities that occurred after pt's significant other was giving her a back massage while sitting on her buttocks and was standing up/off her. Sensation and motor function in all extremities intact at this time. Hx L5 herniated disc, pt states that her pain is around the same area. 10/10 pain at this time. No pre-hospital meds given.  ? ?EMS:  ?110/48 ?RR 22 ?96% room air  ?HR 97 ?

## 2022-02-20 ENCOUNTER — Emergency Department (HOSPITAL_COMMUNITY): Payer: Medicaid Other

## 2022-02-20 ENCOUNTER — Encounter (HOSPITAL_COMMUNITY): Payer: Self-pay | Admitting: Internal Medicine

## 2022-02-20 DIAGNOSIS — M5410 Radiculopathy, site unspecified: Secondary | ICD-10-CM | POA: Diagnosis not present

## 2022-02-20 DIAGNOSIS — F411 Generalized anxiety disorder: Secondary | ICD-10-CM | POA: Diagnosis not present

## 2022-02-20 DIAGNOSIS — E876 Hypokalemia: Secondary | ICD-10-CM

## 2022-02-20 LAB — URINALYSIS, ROUTINE W REFLEX MICROSCOPIC
Bilirubin Urine: NEGATIVE
Glucose, UA: NEGATIVE mg/dL
Hgb urine dipstick: NEGATIVE
Ketones, ur: NEGATIVE mg/dL
Leukocytes,Ua: NEGATIVE
Nitrite: NEGATIVE
Protein, ur: NEGATIVE mg/dL
Specific Gravity, Urine: 1.01 (ref 1.005–1.030)
pH: 9 — ABNORMAL HIGH (ref 5.0–8.0)

## 2022-02-20 LAB — I-STAT CHEM 8, ED
BUN: 8 mg/dL (ref 6–20)
Calcium, Ion: 1.18 mmol/L (ref 1.15–1.40)
Chloride: 104 mmol/L (ref 98–111)
Creatinine, Ser: 0.8 mg/dL (ref 0.44–1.00)
Glucose, Bld: 126 mg/dL — ABNORMAL HIGH (ref 70–99)
HCT: 40 % (ref 36.0–46.0)
Hemoglobin: 13.6 g/dL (ref 12.0–15.0)
Potassium: 3.4 mmol/L — ABNORMAL LOW (ref 3.5–5.1)
Sodium: 141 mmol/L (ref 135–145)
TCO2: 26 mmol/L (ref 22–32)

## 2022-02-20 LAB — CBC WITH DIFFERENTIAL/PLATELET
Abs Immature Granulocytes: 0.02 10*3/uL (ref 0.00–0.07)
Basophils Absolute: 0 10*3/uL (ref 0.0–0.1)
Basophils Relative: 0 %
Eosinophils Absolute: 0.2 10*3/uL (ref 0.0–0.5)
Eosinophils Relative: 2 %
HCT: 40.1 % (ref 36.0–46.0)
Hemoglobin: 13.8 g/dL (ref 12.0–15.0)
Immature Granulocytes: 0 %
Lymphocytes Relative: 23 %
Lymphs Abs: 2.2 10*3/uL (ref 0.7–4.0)
MCH: 30.9 pg (ref 26.0–34.0)
MCHC: 34.4 g/dL (ref 30.0–36.0)
MCV: 89.9 fL (ref 80.0–100.0)
Monocytes Absolute: 0.8 10*3/uL (ref 0.1–1.0)
Monocytes Relative: 8 %
Neutro Abs: 6.6 10*3/uL (ref 1.7–7.7)
Neutrophils Relative %: 67 %
Platelets: 199 10*3/uL (ref 150–400)
RBC: 4.46 MIL/uL (ref 3.87–5.11)
RDW: 11.8 % (ref 11.5–15.5)
WBC: 9.9 10*3/uL (ref 4.0–10.5)
nRBC: 0 % (ref 0.0–0.2)

## 2022-02-20 LAB — RESP PANEL BY RT-PCR (FLU A&B, COVID) ARPGX2
Influenza A by PCR: NEGATIVE
Influenza B by PCR: NEGATIVE
SARS Coronavirus 2 by RT PCR: NEGATIVE

## 2022-02-20 IMAGING — MR MR LUMBAR SPINE W/O CM
4 of 5 series · 26 of 48 positions shown · non-contrast
Comparison: None.

CLINICAL DATA: Trauma with low back pain

EXAM:
MRI LUMBAR SPINE WITHOUT CONTRAST
TECHNIQUE: Multiplanar, multisequence MR imaging of the lumbar spine was
performed. No intravenous contrast was administered.

[Series 5: T2 · sagittal · 4.0mm · 0.73mm/px · 6 of 17 slices shown (1 of 2)]
[im 1/17]
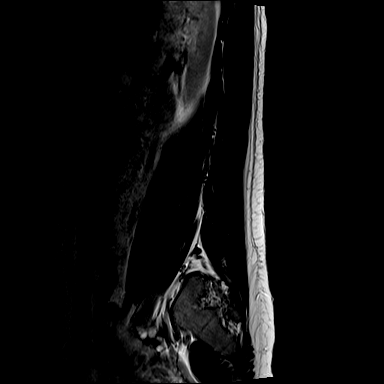
[im 4/17]
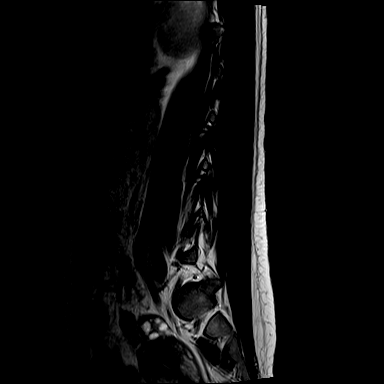
[im 7/17]
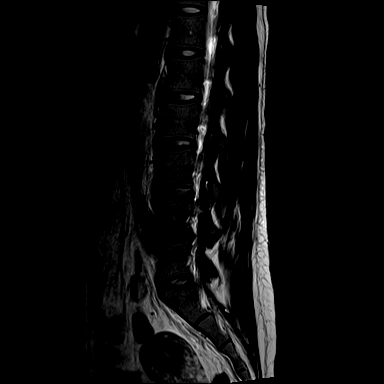
[im 10/17]
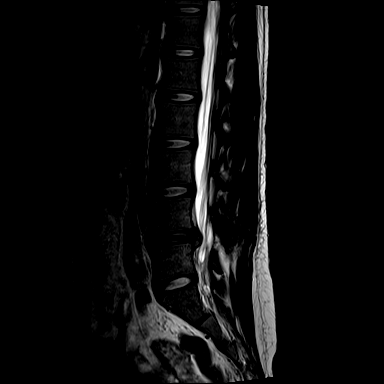
[im 13/17]
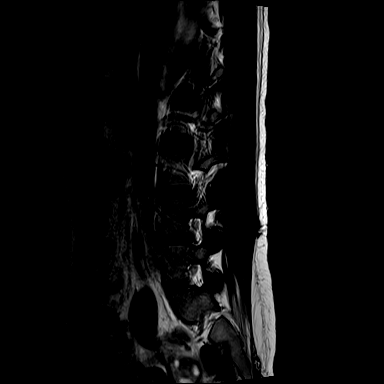
[im 17/17]
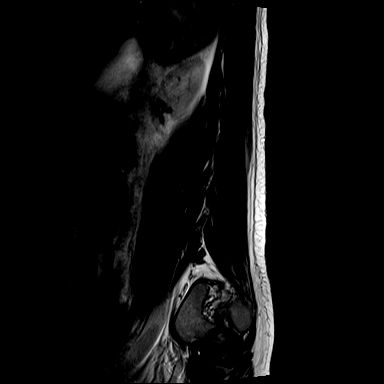

[Series 6: T1 · sagittal · 4.0mm · 0.88mm/px · 7 of 17 slices shown (1 of 2)]
[im 1/17]
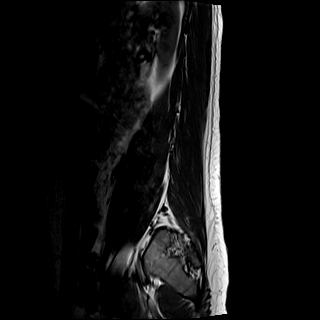
[im 3/17]
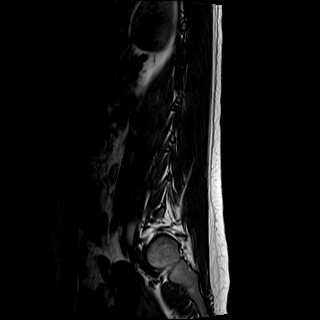
[im 6/17]
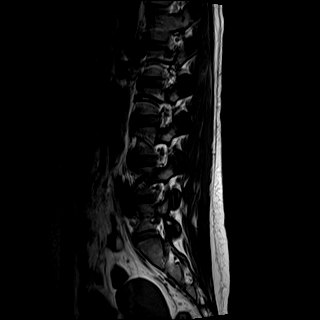
[im 9/17]
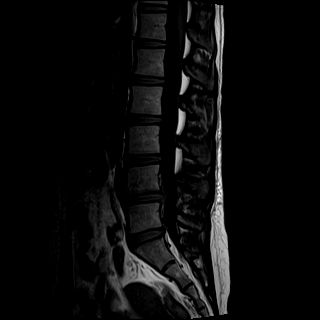
[im 11/17]
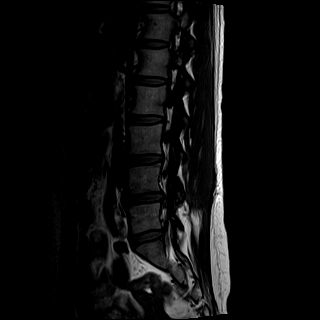
[im 14/17]
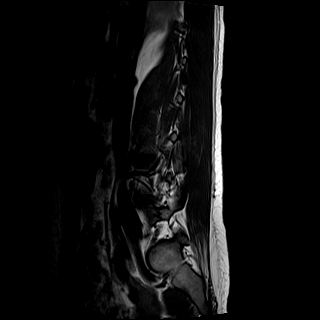
[im 17/17]
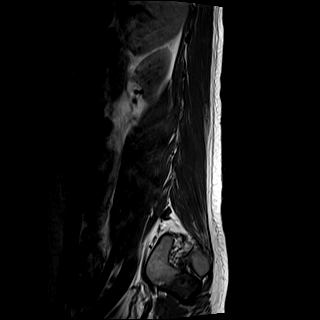

[Series 8: T2 · axial · 4.0mm · 0.57mm/px · z∈[-108,+86]mm · 8 of 37 slices shown (2 of 2)]
[im 1/37]
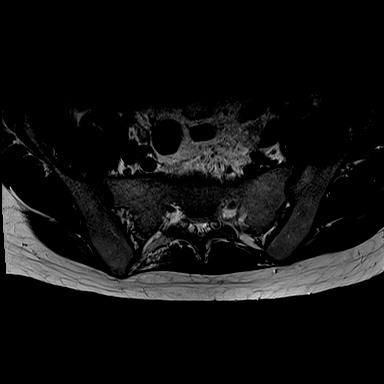
[im 6/37]
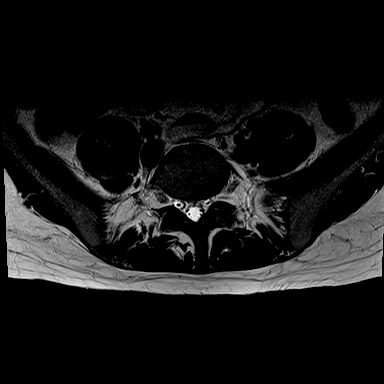
[im 12/37]
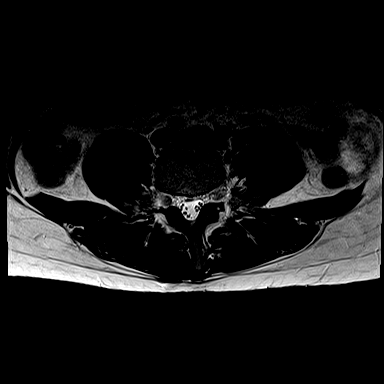
[im 17/37]
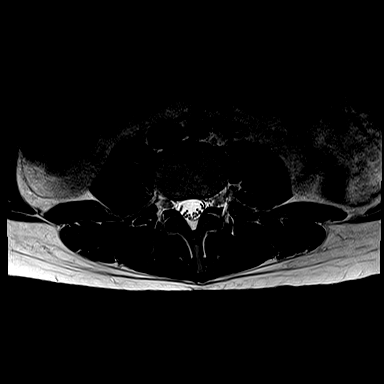
[im 20/37]
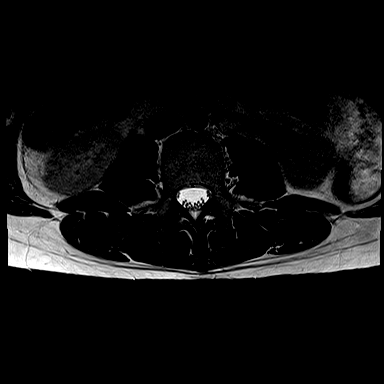
[im 25/37]
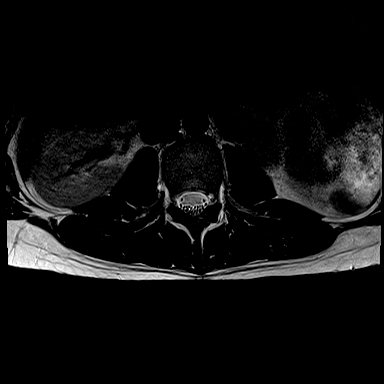
[im 31/37]
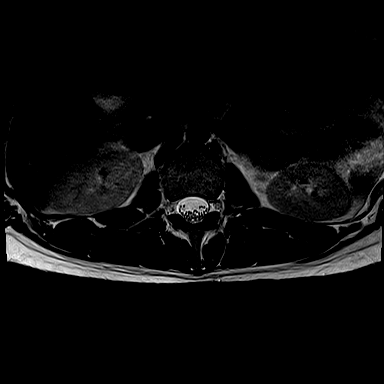
[im 37/37]
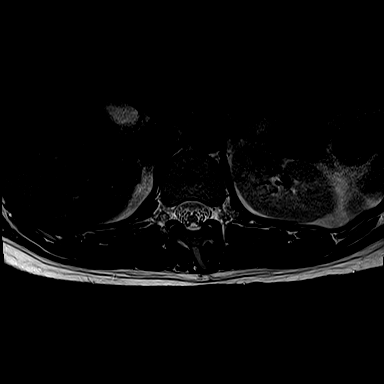

[Series 9: T1 · axial · 4.0mm · 0.34mm/px · z∈[-108,+56]mm · 5 of 37 slices shown (2 of 2)]
[im 1/37]
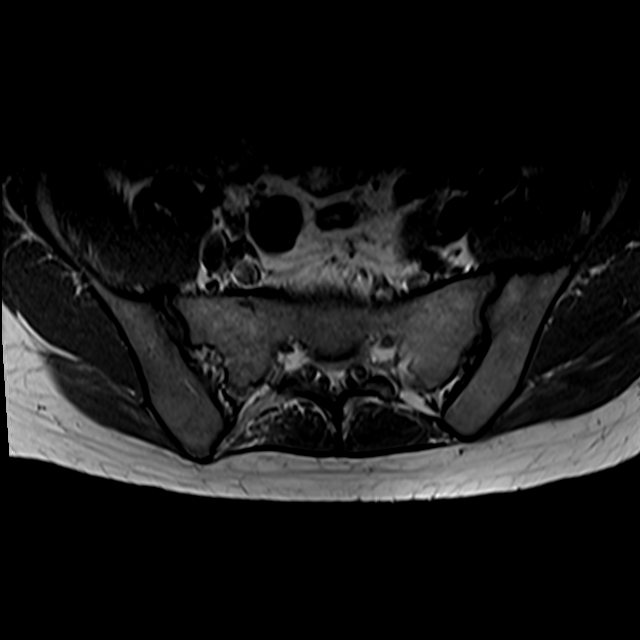
[im 6/37]
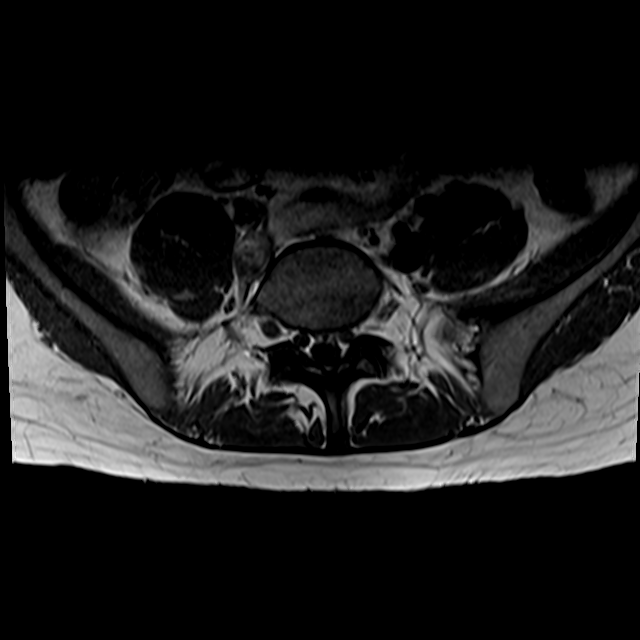
[im 12/37]
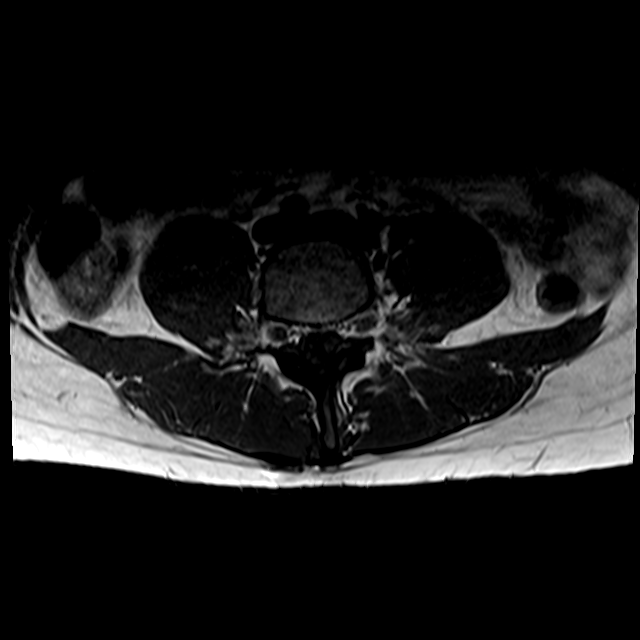
[im 20/37]
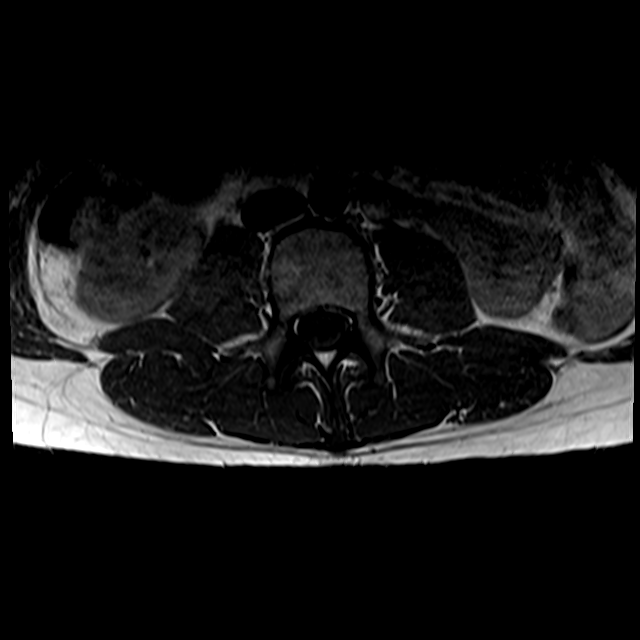
[im 31/37]
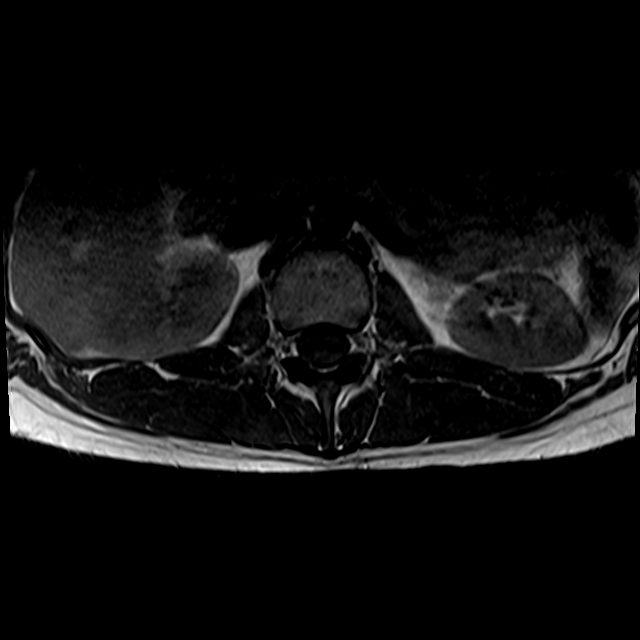

[26 of 48 positions shown; findings below may reference images not displayed]

FINDINGS: Segmentation:  Standard.

Alignment:  Physiologic.

Vertebrae:  No fracture, evidence of discitis, or bone lesion.

Conus medullaris and cauda equina: Conus extends to the L1 level.
Conus and cauda equina appear normal.

Paraspinal and other soft tissues: Negative.

Disc levels:

L1-L2: Normal disc space and facet joints. No spinal canal stenosis.
No neural foraminal stenosis.

L2-L3: Normal disc space and facet joints. No spinal canal stenosis.
No neural foraminal stenosis.

L3-L4: Normal disc space and facet joints. No spinal canal stenosis.
No neural foraminal stenosis.

L4-L5: Medium-sized central disc extrusion with inferior migration
causing narrowing of the right lateral recess with displacement of
the right L5 nerve root. No central spinal canal stenosis. No neural
foraminal stenosis.

L5-S1: Normal disc space and facet joints. No spinal canal stenosis.
No neural foraminal stenosis.

Visualized sacrum: Normal.
IMPRESSION: 1. Medium-sized central disc extrusion with inferior migration at
L4-L5 causing narrowing of the right lateral recess with
displacement of the right L5 nerve root. Correlate for right L5
radiculopathy.
2. Otherwise normal lumbar spine.

## 2022-02-20 IMAGING — DX DG LUMBAR SPINE COMPLETE 4+V
6 series · 6 of 6 positions shown · non-contrast
Comparison: CT [DATE]

CLINICAL DATA: Back pain

EXAM:
LUMBAR SPINE - COMPLETE 4+ VIEW

[l-spine lat]
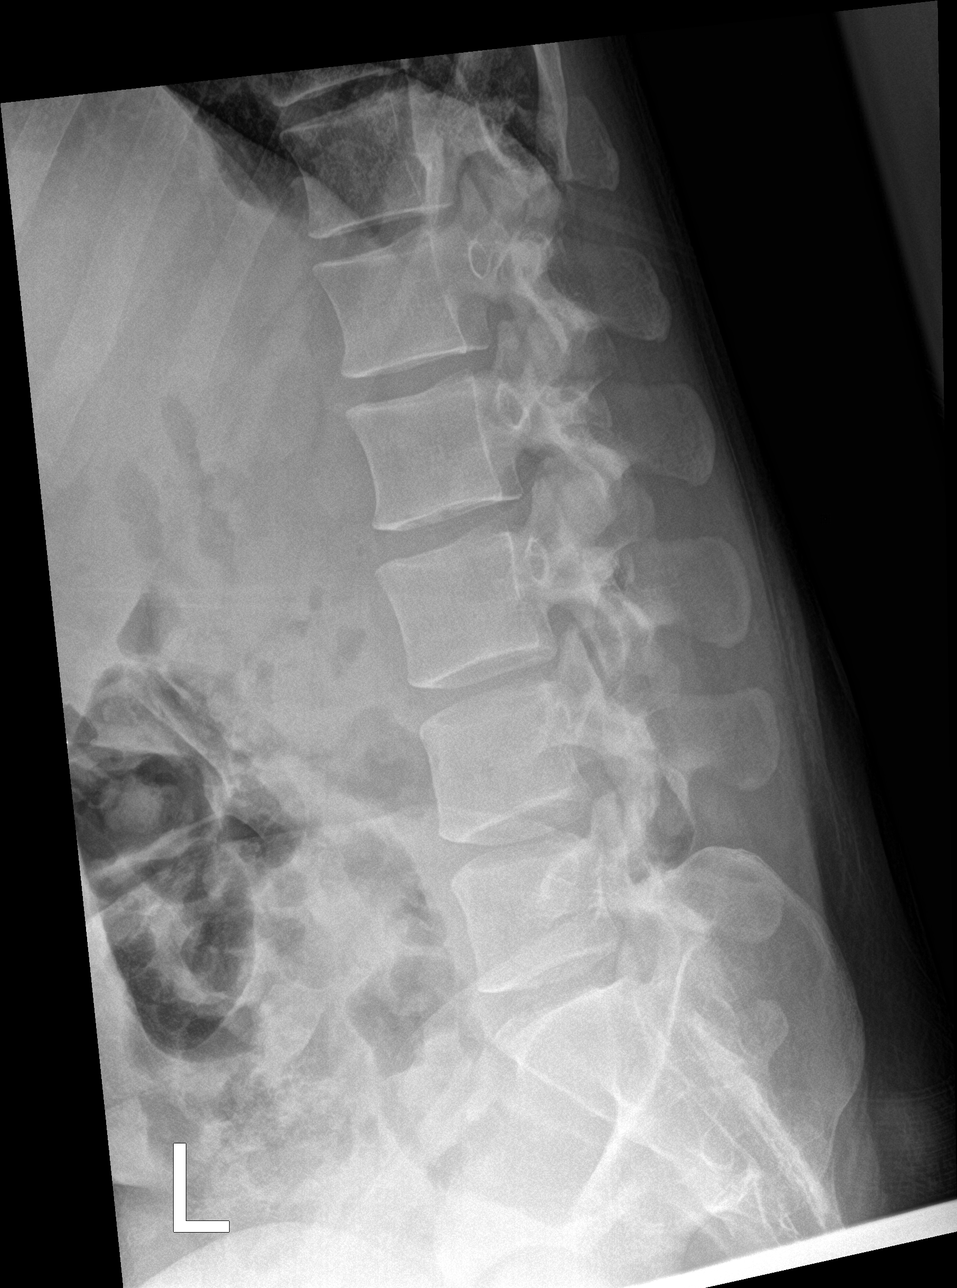

[l-spine spot]
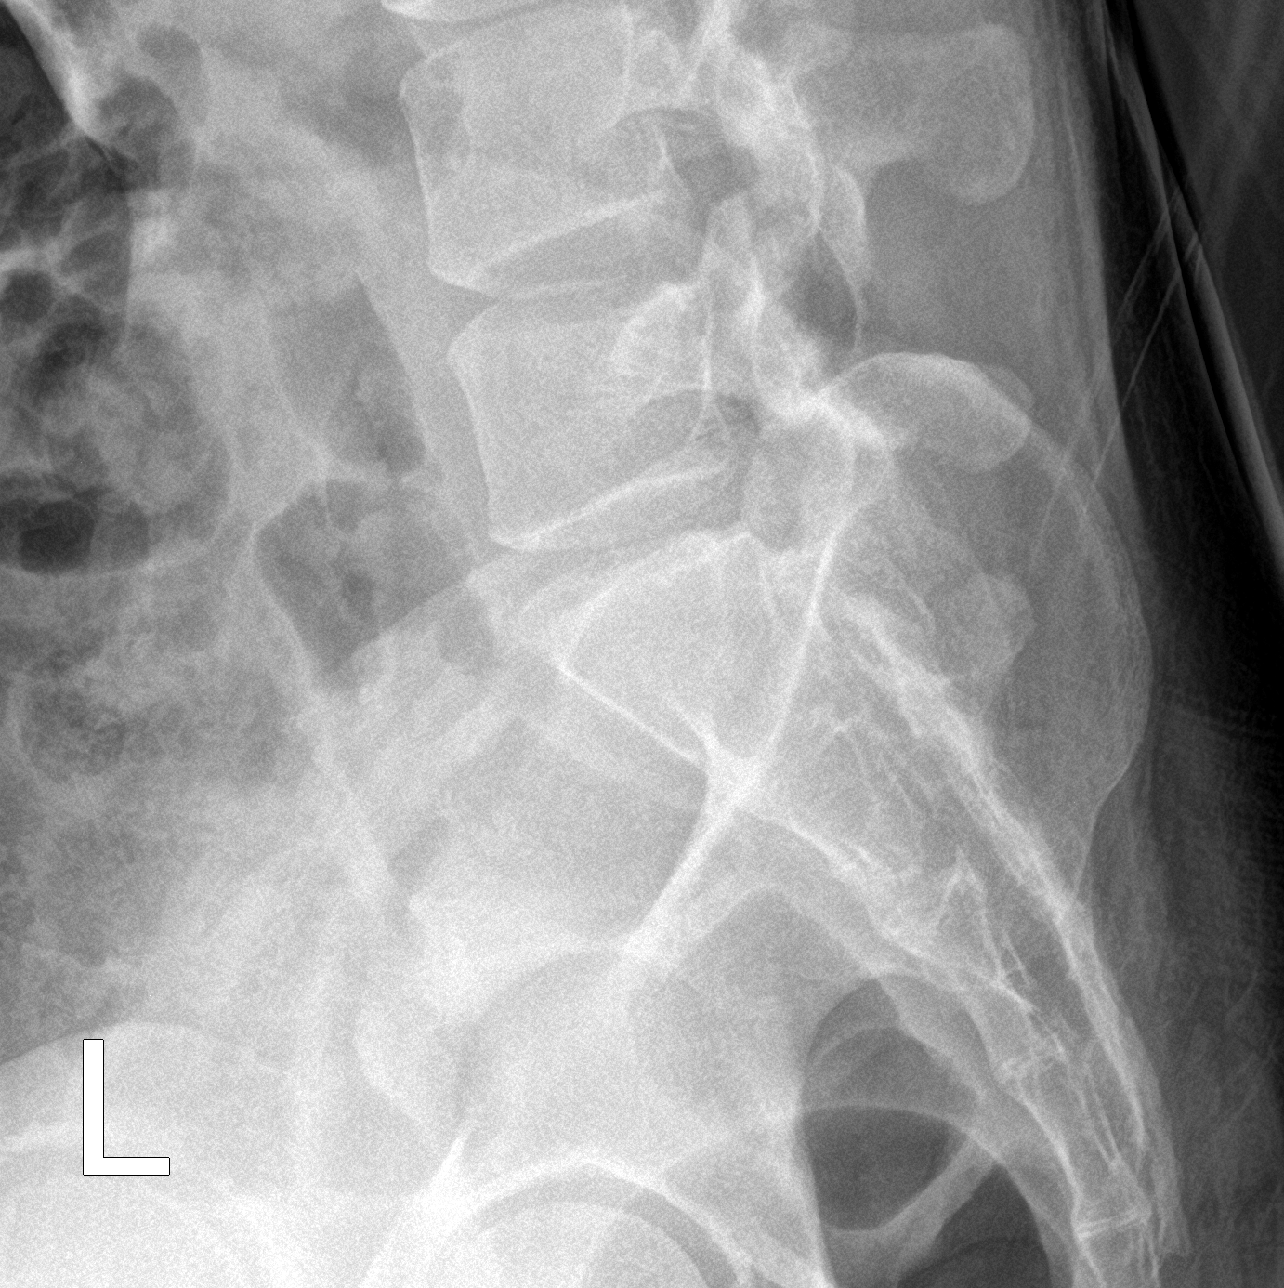

[l-spine ap]
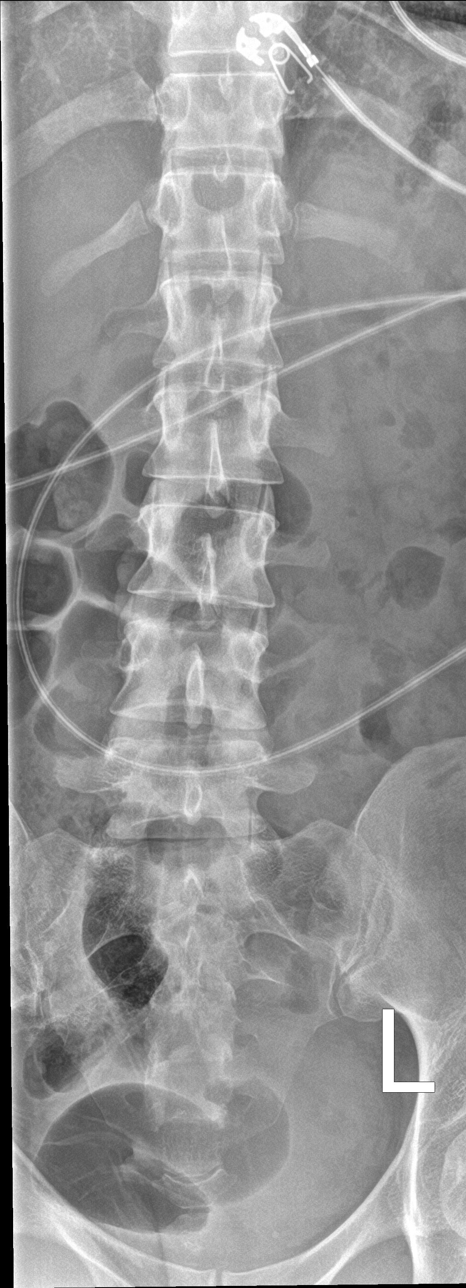

[l-spine obl (1 of 3)]
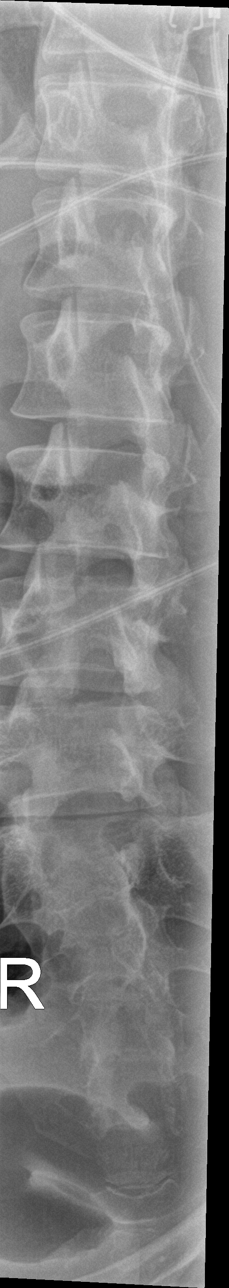

[l-spine obl (2 of 3)]
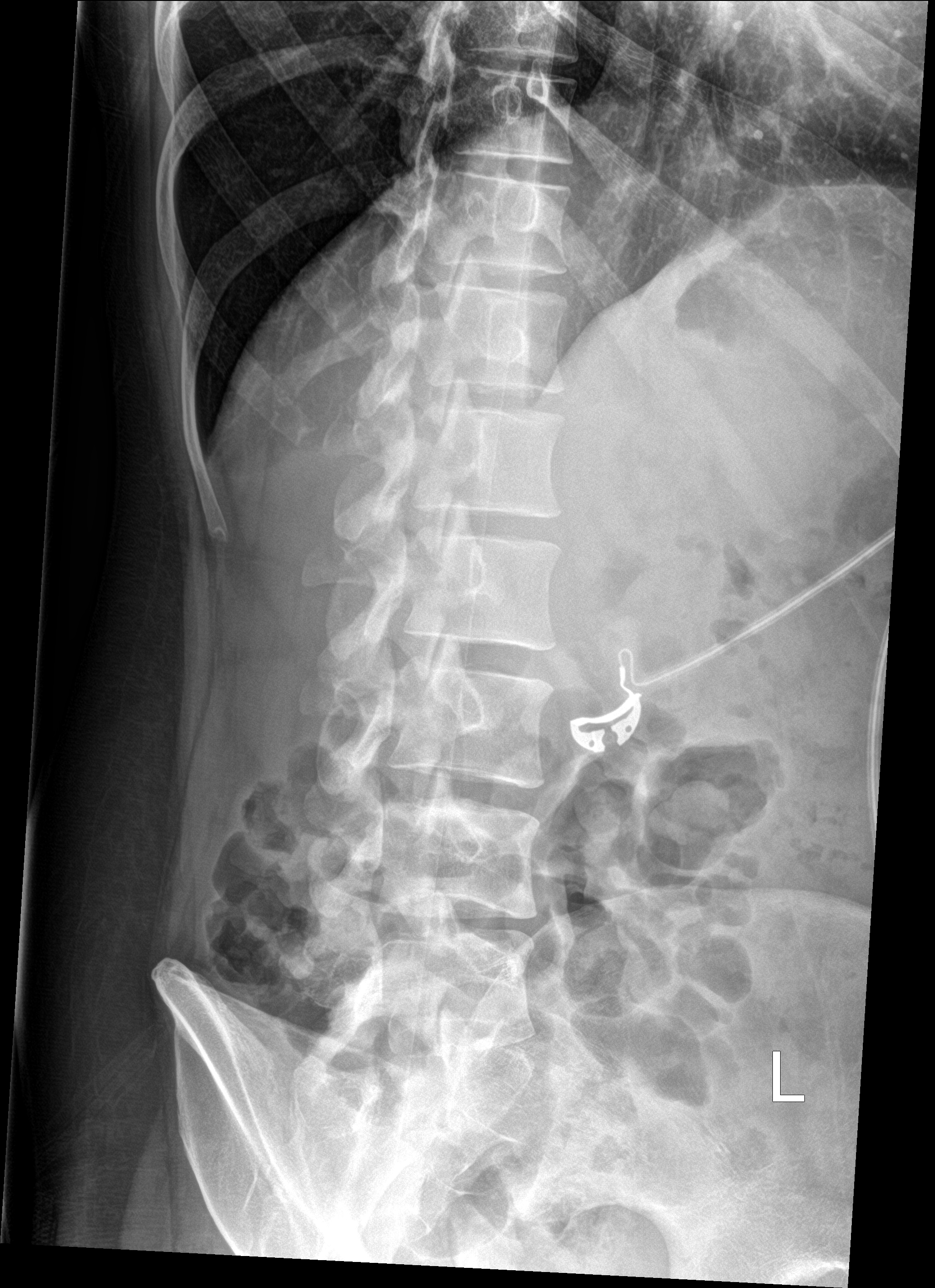

[l-spine obl (3 of 3)]
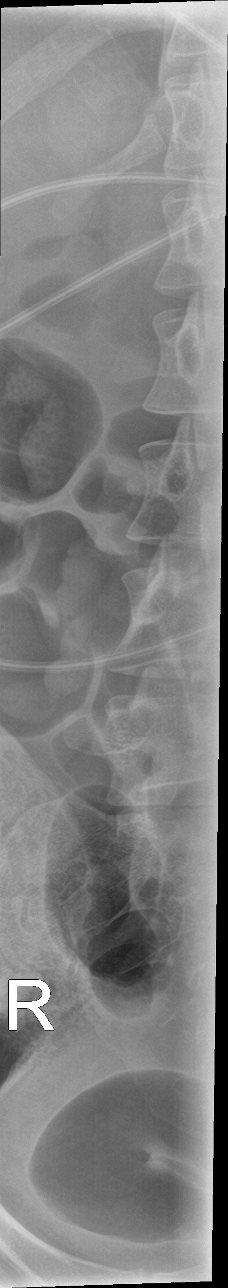

[6 of 6 positions shown; findings below may reference images not displayed]

FINDINGS: There is no evidence of lumbar spine fracture. Alignment is normal.
Intervertebral disc spaces are maintained.
IMPRESSION: Negative.

## 2022-02-20 MED ORDER — ACETAMINOPHEN 650 MG RE SUPP: 650.0000 mg | Freq: Four times a day (QID) | RECTAL | Status: DC | PRN

## 2022-02-20 MED ORDER — FENTANYL CITRATE PF 50 MCG/ML IJ SOSY
50.0000 ug | PREFILLED_SYRINGE | Freq: Once | INTRAMUSCULAR | Status: AC
  Administered 2022-02-20: 50 ug via INTRAVENOUS
  Filled 2022-02-20: qty 1

## 2022-02-20 MED ORDER — POLYVINYL ALCOHOL-POVIDONE PF 1.4-0.6 % OP SOLN: 1.0000 [drp] | Freq: Three times a day (TID) | OPHTHALMIC | Status: DC | PRN

## 2022-02-20 MED ORDER — CLONAZEPAM 0.5 MG PO TABS
0.5000 mg | ORAL_TABLET | Freq: Every day | ORAL | Status: DC | PRN
  Administered 2022-02-21: 0.5 mg via ORAL
  Filled 2022-02-20: qty 1

## 2022-02-20 MED ORDER — ONDANSETRON HCL 4 MG PO TABS: 4.0000 mg | ORAL_TABLET | Freq: Four times a day (QID) | ORAL | Status: DC | PRN

## 2022-02-20 MED ORDER — ACETAMINOPHEN 325 MG PO TABS
650.0000 mg | ORAL_TABLET | Freq: Four times a day (QID) | ORAL | Status: DC | PRN
  Administered 2022-02-21: 650 mg via ORAL
  Filled 2022-02-20: qty 2

## 2022-02-20 MED ORDER — DROSPIRENONE 4 MG PO TABS
4.0000 mg | ORAL_TABLET | Freq: Every day | ORAL | Status: DC
Start: 2022-02-20 — End: 2022-02-23
  Administered 2022-02-21 – 2022-02-22 (×2): 4 mg via ORAL

## 2022-02-20 MED ORDER — FENTANYL CITRATE PF 50 MCG/ML IJ SOSY
25.0000 ug | PREFILLED_SYRINGE | INTRAMUSCULAR | Status: DC | PRN
  Administered 2022-02-20 – 2022-02-22 (×7): 25 ug via INTRAVENOUS
  Filled 2022-02-20 (×7): qty 1

## 2022-02-20 MED ORDER — CYCLOBENZAPRINE HCL 5 MG PO TABS
5.0000 mg | ORAL_TABLET | Freq: Three times a day (TID) | ORAL | Status: DC | PRN
  Administered 2022-02-20 – 2022-02-22 (×4): 5 mg via ORAL
  Filled 2022-02-20 (×4): qty 1

## 2022-02-20 MED ORDER — POTASSIUM CHLORIDE CRYS ER 20 MEQ PO TBCR
20.0000 meq | EXTENDED_RELEASE_TABLET | ORAL | Status: AC
  Administered 2022-02-20: 20 meq via ORAL
  Filled 2022-02-20: qty 1

## 2022-02-20 MED ORDER — FLUTICASONE PROPIONATE 50 MCG/ACT NA SUSP: 1.0000 | Freq: Every day | NASAL | Status: DC | PRN

## 2022-02-20 MED ORDER — DIAZEPAM 5 MG PO TABS
5.0000 mg | ORAL_TABLET | Freq: Once | ORAL | Status: AC
  Administered 2022-02-20: 5 mg via ORAL
  Filled 2022-02-20: qty 1

## 2022-02-20 MED ORDER — OXYCODONE-ACETAMINOPHEN 5-325 MG PO TABS: 1.0000 | ORAL_TABLET | Freq: Four times a day (QID) | ORAL | 0 refills | Status: DC | PRN

## 2022-02-20 MED ORDER — PREDNISONE 20 MG PO TABS: ORAL_TABLET | ORAL | 0 refills | Status: DC

## 2022-02-20 MED ORDER — CYCLOBENZAPRINE HCL 5 MG PO TABS
5.0000 mg | ORAL_TABLET | Freq: Three times a day (TID) | ORAL | 0 refills | Status: DC | PRN
Start: 2022-02-20 — End: 2024-10-05

## 2022-02-20 MED ORDER — MAGNESIUM HYDROXIDE 400 MG/5ML PO SUSP: 30.0000 mL | Freq: Every day | ORAL | Status: DC | PRN

## 2022-02-20 MED ORDER — LIDOCAINE 5 % EX PTCH
1.0000 | MEDICATED_PATCH | CUTANEOUS | Status: DC
  Administered 2022-02-20 – 2022-02-22 (×3): 1 via TRANSDERMAL
  Filled 2022-02-20 (×3): qty 1

## 2022-02-20 MED ORDER — POLYVINYL ALCOHOL 1.4 % OP SOLN: 1.0000 [drp] | Freq: Three times a day (TID) | OPHTHALMIC | Status: DC | PRN

## 2022-02-20 MED ORDER — PREDNISONE 20 MG PO TABS
60.0000 mg | ORAL_TABLET | Freq: Once | ORAL | Status: AC
  Administered 2022-02-20: 60 mg via ORAL
  Filled 2022-02-20: qty 3

## 2022-02-20 MED ORDER — ONDANSETRON HCL 4 MG/2ML IJ SOLN
4.0000 mg | Freq: Four times a day (QID) | INTRAMUSCULAR | Status: DC | PRN
  Filled 2022-02-20: qty 2

## 2022-02-20 MED ORDER — OXYCODONE-ACETAMINOPHEN 5-325 MG PO TABS
1.0000 | ORAL_TABLET | Freq: Once | ORAL | Status: AC
  Administered 2022-02-20: 1 via ORAL
  Filled 2022-02-20: qty 1

## 2022-02-20 MED ORDER — SODIUM CHLORIDE 0.9 % IV SOLN
12.5000 mg | Freq: Four times a day (QID) | INTRAVENOUS | Status: DC | PRN
  Administered 2022-02-20 – 2022-02-22 (×4): 12.5 mg via INTRAVENOUS
  Filled 2022-02-20 (×4): qty 0.5

## 2022-02-20 MED ORDER — PREDNISONE 20 MG PO TABS
40.0000 mg | ORAL_TABLET | Freq: Every day | ORAL | Status: DC
  Administered 2022-02-21 – 2022-02-22 (×2): 40 mg via ORAL
  Filled 2022-02-20 (×2): qty 2

## 2022-02-20 MED ORDER — SODIUM CHLORIDE 0.9 % IV SOLN: Freq: Once | INTRAVENOUS | Status: AC

## 2022-02-20 MED ORDER — TRAZODONE HCL 50 MG PO TABS
25.0000 mg | ORAL_TABLET | Freq: Every evening | ORAL | Status: DC | PRN
  Administered 2022-02-20 – 2022-02-21 (×2): 25 mg via ORAL
  Filled 2022-02-20 (×2): qty 1

## 2022-02-20 MED ORDER — SODIUM CHLORIDE 0.9 % IV SOLN: INTRAVENOUS | Status: DC

## 2022-02-20 MED ORDER — ENOXAPARIN SODIUM 40 MG/0.4ML IJ SOSY
40.0000 mg | PREFILLED_SYRINGE | Freq: Every day | INTRAMUSCULAR | Status: DC
  Administered 2022-02-20 – 2022-02-22 (×3): 40 mg via SUBCUTANEOUS
  Filled 2022-02-20 (×3): qty 0.4

## 2022-02-20 MED ORDER — OXYCODONE-ACETAMINOPHEN 5-325 MG PO TABS
1.0000 | ORAL_TABLET | Freq: Four times a day (QID) | ORAL | Status: DC | PRN
  Administered 2022-02-20 – 2022-02-22 (×8): 1 via ORAL
  Filled 2022-02-20 (×9): qty 1

## 2022-02-20 MED ORDER — CYCLOBENZAPRINE HCL 10 MG PO TABS
10.0000 mg | ORAL_TABLET | Freq: Once | ORAL | Status: AC
  Administered 2022-02-20: 10 mg via ORAL
  Filled 2022-02-20: qty 1

## 2022-02-20 NOTE — Progress Notes (Signed)
Orthopedic Tech Progress Note ?Patient Details:  ?Abigail Harding ?03-Feb-1991 ?[id number] ? ?Ortho Devices ?Type of Ortho Device: Lumbar corsett ?Ortho Device/Splint Location: BACK ?Ortho Device/Splint Interventions: Ordered ?  ?Post Interventions ?Patient Tolerated: Well ?Instructions Provided: Care of device ? ?Donald Pore ?02/20/2022, 11:40 AM ? ?

## 2022-02-20 NOTE — Evaluation (Signed)
Physical Therapy Evaluation ?Patient Details ?Name: Abigail Harding ?MRN: [id number] ?DOB: May 18, 1991 ?Today's Date: 02/20/2022 ? ?History of Present Illness ? Pt is a 31 y.o. female presenting to ED 02/19/22 with severe back pain that radiates to her R leg. Imaging (+) for L4-L5 R lateral recess and L5 radiculopathy. PMH includes ovarian cyst and prior lumbar surgery (2022).  ?Clinical Impression ? Pt admitted secondary to problem above with deficits below. Pt requiring increased time for mobility tasks and very limited secondary to pain. Pt requiring min A for bed mobility and min A +2 to stand at EOB. Unable to bear weight on RLE and unable to take steps. Anticipate pt will progress well once pain controlled, however, will need continued acute PT to maximize functional mobility independence and safety prior to return home. Will continue to follow acutely.    ?   ? ?Recommendations for follow up therapy are one component of a multi-disciplinary discharge planning process, led by the attending physician.  Recommendations may be updated based on patient status, additional functional criteria and insurance authorization. ? ?Follow Up Recommendations Other (comment) (TBD pending pt progression) ? ?  ?Assistance Recommended at Discharge Frequent or constant Supervision/Assistance  ?Patient can return home with the following ? A little help with walking and/or transfers;A little help with bathing/dressing/bathroom;Assistance with cooking/housework;Help with stairs or ramp for entrance;Assist for transportation ? ?  ?Equipment Recommendations Wheelchair (measurements PT);Wheelchair cushion (measurements PT)  ?Recommendations for Other Services ?    ?  ?Functional Status Assessment Patient has had a recent decline in their functional status and demonstrates the ability to make significant improvements in function in a reasonable and predictable amount of time.  ? ?  ?Precautions / Restrictions Precautions ?Precautions:  Fall ?Precaution Comments: back for comfort ?Restrictions ?Weight Bearing Restrictions: No  ? ?  ? ?Mobility ? Bed Mobility ?Overal bed mobility: Needs Assistance ?Bed Mobility: Rolling, Sidelying to Sit, Sit to Sidelying ?Rolling: Min assist ?Sidelying to sit: Min assist, +2 for safety/equipment ?  ?  ?Sit to sidelying: Min guard, +2 for safety/equipment, +2 for physical assistance ?General bed mobility comments: Min A for assist with rolling and trunk elevation to come to sitting. Increased pain and required increased time. ?  ? ?Transfers ?Overall transfer level: Needs assistance ?Equipment used: 2 person hand held assist ?Transfers: Sit to/from Stand ?Sit to Stand: Min assist, +2 physical assistance ?  ?  ?  ?  ?  ?General transfer comment: Min A +2 for lift assist to come to standing. Pt reporting increased pain and unable to bear weight through RLE, so further mobility deferred. ?  ? ?Ambulation/Gait ?  ?  ?  ?  ?  ?  ?  ?  ? ?Stairs ?  ?  ?  ?  ?  ? ?Wheelchair Mobility ?  ? ?Modified Rankin (Stroke Patients Only) ?  ? ?  ? ?Balance Overall balance assessment: Needs assistance ?Sitting-balance support: Feet supported, Bilateral upper extremity supported ?Sitting balance-Leahy Scale: Fair ?  ?  ?Standing balance support: Bilateral upper extremity supported ?Standing balance-Leahy Scale: Poor ?Standing balance comment: Reliant on BUE support ?  ?  ?  ?  ?  ?  ?  ?  ?  ?  ?  ?   ? ? ? ?Pertinent Vitals/Pain Pain Assessment ?Pain Assessment: 0-10 ?Pain Score: 7  ?Pain Location: back ?Pain Descriptors / Indicators: Grimacing, Guarding ?Pain Intervention(s): Limited activity within patient's tolerance, Monitored during session, Repositioned  ? ? ?Home  Living Family/patient expects to be discharged to:: Private residence ?Living Arrangements: Parent ?Available Help at Discharge: Family;Available 24 hours/day ?Type of Home: House ?Home Access: Stairs to enter ?Entrance Stairs-Rails: None ?Entrance Stairs-Number of  Steps: 1 ?  ?Home Layout: One level ?Home Equipment: Conservation officer, nature (2 wheels);Shower seat;BSC/3in1 ?   ?  ?Prior Function Prior Level of Function : Independent/Modified Independent ?  ?  ?  ?  ?  ?  ?  ?  ?  ? ? ?Hand Dominance  ?   ? ?  ?Extremity/Trunk Assessment  ? Upper Extremity Assessment ?Upper Extremity Assessment: Overall WFL for tasks assessed ?  ? ?Lower Extremity Assessment ?Lower Extremity Assessment: Defer to PT evaluation ?RLE Deficits / Details: Reports sharp pain at times. Was unable to bear weight through RLE ?  ? ?Cervical / Trunk Assessment ?Cervical / Trunk Assessment: Other exceptions ?Cervical / Trunk Exceptions: back pain  ?Communication  ? Communication: No difficulties  ?Cognition Arousal/Alertness: Awake/alert ?Behavior During Therapy: [facility] for tasks assessed/performed ?Overall Cognitive Status: Within Functional Limits for tasks assessed ?  ?  ?  ?  ?  ?  ?  ?  ?  ?  ?  ?  ?  ?  ?  ?  ?  ?  ?  ? ?  ?General Comments   ? ?  ?Exercises    ? ?Assessment/Plan  ?  ?PT Assessment Patient needs continued PT services  ?PT Problem List Decreased strength;Decreased activity tolerance;Decreased balance;Decreased mobility;Pain ? ?   ?  ?PT Treatment Interventions DME instruction;Gait training;Functional mobility training;Therapeutic activities;Therapeutic exercise;Balance training;Patient/family education;Stair training   ? ?PT Goals (Current goals can be found in the Care Plan section)  ?Acute Rehab PT Goals ?Patient Stated Goal: to decrease pain ?PT Goal Formulation: With patient ?Time For Goal Achievement: 03/06/22 ?Potential to Achieve Goals: Good ? ?  ?Frequency Min 3X/week ?  ? ? ?Co-evaluation PT/OT/SLP Co-Evaluation/Treatment: Yes ?Reason for Co-Treatment: For patient/therapist safety;To address functional/ADL transfers ?PT goals addressed during session: Mobility/safety with mobility;Balance ?OT goals addressed during session: ADL's and self-care ?  ? ? ?  ?AM-PAC PT "6 Clicks" Mobility   ?Outcome Measure Help needed turning from your back to your side while in a flat bed without using bedrails?: A Little ?Help needed moving from lying on your back to sitting on the side of a flat bed without using bedrails?: A Little ?Help needed moving to and from a bed to a chair (including a wheelchair)?: A Lot ?Help needed standing up from a chair using your arms (e.g., wheelchair or bedside chair)?: A Lot ?Help needed to walk in hospital room?: Total ?Help needed climbing 3-5 steps with a railing? : Total ?6 Click Score: 12 ? ?  ?End of Session   ?Activity Tolerance: Patient limited by pain ?Patient left: in bed;with call bell/phone within reach (on stretcher in ED) ?Nurse Communication: Mobility status ?PT Visit Diagnosis: Other abnormalities of gait and mobility (R26.89);Difficulty in walking, not elsewhere classified (R26.2);Pain ?Pain - Right/Left: Right ?Pain - part of body: Leg (back) ?  ? ?Time: [id number] ?PT Time Calculation (min) (ACUTE ONLY): 22 min ? ? ?Charges:   PT Evaluation ?$PT Eval Moderate Complexity: 1 Mod ?  ?  ?   ? ? ?Reuel Derby, PT, DPT  ?Acute Rehabilitation Services  ?Pager: [phone] ?Office: [phone] ? ? ?[place] ?02/20/2022, 11:11 AM ?

## 2022-02-20 NOTE — Discharge Instructions (Addendum)
Your symptom is related to sciatica which is a pinched nerve causing pain.  Please take medication prescribed as needed for your symptom control.  I have provided a work note for you to take several days off.  You may follow-up with your neurosurgeon for further managements of your condition.  Return if you have any concern. ?

## 2022-02-20 NOTE — ED Notes (Signed)
Patient transported to MRI 

## 2022-02-20 NOTE — ED Notes (Signed)
Pt called out to this RN in severe pain, stating "my back is locking up," and seen rigid in bed, gripping handrails tightly and face appearing strained. ED provider made aware of ongoing intense pain. Plan for provider to discuss idea of inpatient treatment with pt ?

## 2022-02-20 NOTE — H&P (Addendum)
?History and Physical  ? ? ?Patient: Abigail Harding ZPH:[id number] DOB: [birth year] ?DOA: 02/19/2022 ?DOS: the patient was seen and examined on 02/20/2022 ?PCP: [facility]  ?Patient coming from: Home via EMS ? ?Chief Complaint:  ?Chief Complaint  ?Patient presents with  ? Back Pain  ? ?HPI: Abigail Harding is a 31 y.o. female with medical history significant of anxiety, nephrolithiasis, nonepileptic events, pelvic pain, and back pain who presents with complaints of lower back pain last night.  She had been receiving a massage by her boyfriend who was sitting on the lower part of her buttocks and leg.  He had been rubbing her shoulders at the time and after he was done got up, but she reports that he never was standing on her back.  When she tried to push herself up felt a sharp stabbing pain radiating across her lower back and all the way down her right leg.  Pain was severe and she was unable to get up.  Pain across her lower back is constant at this time and any kind of movement worsen symptoms.  She is not able to lay on her right hip due to the pain.  Reported having episode where she felt numbness in the bilateral upper extremities as well as the bilateral lower extremities.  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 Christain Sacramento of [facility] in June 2022.  Reported trying to use a lidocaine patch without any relief.  She does report having some nausea related to pain medications given.  Denies any recent fever, saddle anesthesia, inability to void, or chest pain.  She does vape, admit to using marijuana, and occasional use of alcohol. ? ?On admission to the emergency department patient was noted to have stable vital signs.  Labs significant for i-STAT potassium of 3.4.  Initial x-rays of the lumbar spine were negative.  Urinalysis did not note any acute abnormalities.  MRI of the lumbar spine  noted medium sized central disc extrusion with inferior migration at L4-L5 causing narrowing of the right lateral recess with displacement of the right L5 root.  She had received multiple rounds of pain medication, Flexeril, and prednisone without improvement in symptoms. ? ?Review of Systems: As mentioned in the history of present illness. All other systems reviewed and are negative. ?Past Medical History:  ?Diagnosis Date  ? ADHD (attention deficit hyperactivity disorder)   ? Anemia   ? Anxiety   ? Asthma   ? Back pain   ? Endometriosis   ? Headache   ? History of fainting   ? History of kidney stones   ? In the past  ? Ovarian cyst   ? PID (pelvic inflammatory disease)   ? Seizures (HCC)   ? ?Past Surgical History:  ?Procedure Laterality Date  ? DILATION AND EVACUATION N/A 02/19/2017  ? Procedure: DILATATION AND EVACUATION;  Surgeon: Herold Harms, MD;  Location: [facility] ORS;  Service: Gynecology;  Laterality: N/A;  ? LAPAROSCOPY    ? LAPAROSCOPY N/A 12/25/2016  ? Procedure: LAPAROSCOPY DIAGNOSTIC WITH BIOPSIES;  Surgeon: Herold Harms, MD;  Location: [facility] ORS;  Service: Gynecology;  Laterality: N/A;  ? TONSILLECTOMY    ? ?Social History:  reports that she has been smoking e-cigarettes. She has never used smokeless tobacco. She reports current alcohol use. She reports current drug use. Drug: Marijuana. ? ?Allergies  ?Allergen Reactions  ? Hydrocodone Itching  ?  Ketorolac Itching and Nausea And Vomiting  ? Morphine And Related Itching  ? Tramadol Nausea And Vomiting and Other (See Comments)  ?  cramping  ? Betamethasone   ?  unknown  ? Hydroxyzine Anxiety  ?  The 25 mg tablets were fine, but when she took the 50 mg tablets at home she had adverse effects.   ? ? ?Family History  ?Problem Relation Age of Onset  ? Diabetes Mother   ? Diabetes Maternal Grandmother   ? Heart disease Maternal Grandmother   ?     GRT ALSO  ? Breast cancer Paternal Grandmother   ?     GRT  ? Prostate cancer Maternal Uncle   ?  Ovarian cancer Neg Hx   ? Colon cancer Neg Hx   ? ? ?Prior to Admission medications   ?Medication Sig Start Date End Date Taking? Authorizing Provider  ?oxyCODONE-acetaminophen (PERCOCET) 5-325 MG tablet Take 1 tablet by mouth every 6 (six) hours as needed for moderate pain or severe pain. 02/20/22  Yes Fayrene Helperran, Bowie, PA-C  ?predniSONE (DELTASONE) 20 MG tablet 3 tabs po day one, then 2 tabs daily x 4 days 02/20/22  Yes Fayrene Helperran, Bowie, PA-C  ?albuterol (VENTOLIN HFA) 108 (90 Base) MCG/ACT inhaler Inhale 1-2 puffs into the lungs as needed for shortness of breath. 05/15/21   [provider]  ?celecoxib (CELEBREX) 200 MG capsule Take 200 mg by mouth as needed for moderate pain. 03/16/21   [provider]  ?clonazePAM (KLONOPIN) 0.5 MG tablet Take 0.5 mg by mouth as needed for anxiety. 06/01/21   [provider]  ?cyclobenzaprine (FLEXERIL) 5 MG tablet Take 1 tablet (5 mg total) by mouth 3 (three) times daily as needed for muscle spasms. 02/20/22   Fayrene Helperran, Bowie, PA-C  ?Drospirenone (SLYND PO) Take 1 tablet by mouth daily. Pt skips placebo pills and continues to next pack in order to skip period    [provider]  ?DULoxetine HCl 40 MG CPEP Take 1 capsule by mouth daily. 06/28/21   [provider]  ?fluticasone (FLONASE) 50 MCG/ACT nasal spray Place 1 spray into the nose daily as needed.    [provider]  ?metoCLOPramide (REGLAN) 10 MG tablet Take 1 tablet (10 mg total) by mouth every 8 (eight) hours as needed for nausea. 07/02/21   Caccavale, Sophia, PA-C  ?promethazine (PHENERGAN) 12.5 MG tablet Take 1 tablet (12.5 mg total) by mouth every 6 (six) hours as needed for nausea or vomiting. 06/15/21   Verdene LennertBasaraba, Iulia, MD  ?tiZANidine (ZANAFLEX) 4 MG tablet Take 2 mg by mouth every 8 (eight) hours as needed. 04/21/21   [provider]  ? ? ?Physical Exam: ?Vitals:  ? 02/20/22 0230 02/20/22 0406 02/20/22 0733 02/20/22 0800  ?BP: 102/68 117/77 102/64 98/61  ?Pulse: 66 76 64 60   ?Resp: 15 10 16 14   ?Temp:      ?TempSrc:      ?SpO2: 100% 99% 99% 100%  ?Weight:      ?Height:      ? ? ?Constitutional: Young female who appears to be in acute distress ?Eyes: PERRL, lids and conjunctivae normal ?ENMT: Mucous membranes are moist.   ?Neck: normal, supple  ?Respiratory: clear to auscultation bilaterally, no wheezing, no crackles. Normal respiratory effort. No accessory muscle use.  ?Cardiovascular: Regular rate and rhythm, no murmurs / rubs / gallops.   ?Musculoskeletal: no clubbing / cyanosis.  Healed surgical scar over the lumbar spine with tenderness palpation appreciated around previous  surgical area.  Reports pain radiating down right leg with movement of the right leg   ?Skin: no rashes, lesions, ulcers.   ?Neurologic: CN 2-12 grossly intact.  Able to move all extremities ?Psychiatric: Normal judgment and insight. Alert and oriented x 3.   ? ?Data Reviewed: ? ?Imaging and x-rays reviewed as noted above in HPI. ? ?Assessment and Plan: ?* Radicular low back pain ?Patient presents with complaints of acute lower back pain with radiation down her right leg.  MRI significant for medium size central disc extrusion with inferior migration at L4-5 causing narrowing of the right lateral recess with displacement of the right L5 nerve root.  Patient had been given  pain medication without improvement in pain symptoms.  She has prior history of L4-L5 microdissection with Dr. Christain Sacramento in 04/2021. ?-Admit to a MedSurg bed ?-Oxycodone/fentanyl as needed for moderate to severe pain ?-Lidocaine patch ?-Continue prednisone ?-Lumbar corset while out of bed ?-PT to evaluate and treat ?-Discussed case including MRI with patient's neurosurgeon Dr. Christain Sacramento of [facility] who agreed with pain control.  Recommended patient to follow-up with him in the outpatient setting as she would likely need revision at some point in time. ? ?Hypokalemia ?Acute.  Initial potassium 3.4. ?-Give  potassium chloride 20 mEq   ?-Continue to monitor and replace as needed ? ?Generalized anxiety disorder ?Home medications include Klonopin 0.5 mg as needed for anxiety ?-Continue Klonopin as needed ? ?  ?Advance Care Planning:   Code Status

## 2022-02-20 NOTE — Assessment & Plan Note (Signed)
Home medications include Klonopin 0.5 mg as needed for anxiety ?-Continue Klonopin as needed ?

## 2022-02-20 NOTE — Assessment & Plan Note (Signed)
Acute.  Initial potassium 3.4. ?-Give  potassium chloride 20 mEq  ?-Continue to monitor and replace as needed ?

## 2022-02-20 NOTE — ED Notes (Signed)
After discussing plan of discharge with ED provider, pt stated to this RN that she feels worried to go home at this time due to "not being able to sit up or stand." RN relayed this information and pt concerns to ED provider. Per ED provider, plan of care updated to hold on discharge, offer additional pain relief medications, and consult physical therapy to see pt in AM. Pt made aware of updated plan of care.  ?

## 2022-02-20 NOTE — Assessment & Plan Note (Addendum)
Patient presents with complaints of acute lower back pain with radiation down her right leg.  MRI significant for medium size central disc extrusion with inferior migration at L4-5 causing narrowing of the right lateral recess with displacement of the right L5 nerve root.  Patient had been given  pain medication without improvement in pain symptoms.  She has prior history of L4-L5 microdissection with Dr. Octavio Manns in 04/2021. ?-Admit to a MedSurg bed ?-Oxycodone/fentanyl as needed for moderate to severe pain ?-Lidocaine patch ?-Continue prednisone ?-Lumbar corset while out of bed ?-PT to evaluate and treat ?-Discussed case including MRI with patient's neurosurgeon Dr. Octavio Manns of [facility] who agreed with pain control.  Recommended patient to follow-up with him in the outpatient setting as she would likely need revision at some point in time. ?

## 2022-02-20 NOTE — ED Notes (Signed)
Per pt request, this RN slowly inclined pt's head of bed to allow pt to passively sit upright as pain allows. At this time, pt able to sit up in bed without increase in pain.  ?

## 2022-02-20 NOTE — Evaluation (Addendum)
Occupational Therapy Evaluation ?Patient Details ?Name: Abigail Harding ?MRN: [id number] ?DOB: [birth year] ?Today's Date: 02/20/2022 ? ? ?History of Present Illness Pt is a 31 y.o. female presenting to ED 02/19/22 with severe back pain that radiates to her R leg. Imaging (+) for L4-L5 R lateral recess and L5 radiculopathy. PMH includes ovarian cyst and prior lumbar surgery (2022).  ? ?Clinical Impression ?  ?Patient admitted for above and presents with problem list below, including back pain and decreased activity tolerance.  Patient currently requires up to mod assist +2 for LB ADLs, min assist +2 for sit to stand transfer with limited tolerance.  Increased pain in sitting and standing, but able to complete with +2 assist.  Slow and guarded throughout session due to pain, educated on back precautions for comfort. Anticipate she will progress well once pain is better controlled, but will follow acutely to optimize independence and return to PLOF with ADLs and mobility.  ?   ? ?Recommendations for follow up therapy are one component of a multi-disciplinary discharge planning process, led by the attending physician.  Recommendations may be updated based on patient status, additional functional criteria and insurance authorization.  ? ?Follow Up Recommendations ? Other (comment) (TBD at pt progresses)  ?  ?Assistance Recommended at Discharge Intermittent Supervision/Assistance  ?Patient can return home with the following Two people to help with walking and/or transfers;A lot of help with bathing/dressing/bathroom;Assistance with cooking/housework;Assist for transportation;Help with stairs or ramp for entrance ? ?  ?Functional Status Assessment ? Patient has had a recent decline in their functional status and demonstrates the ability to make significant improvements in function in a reasonable and predictable amount of time.  ?Equipment Recommendations ? BSC/3in1  ?  ?Recommendations for Other Services   ? ? ?  ?Precautions /  Restrictions Precautions ?Precautions: Fall ?Precaution Comments: back for comfort ?Restrictions ?Weight Bearing Restrictions: No  ? ?  ? ?Mobility Bed Mobility ?Overal bed mobility: Needs Assistance ?Bed Mobility: Rolling, Sidelying to Sit, Sit to Sidelying ?Rolling: Min assist ?Sidelying to sit: Min assist, +2 for safety/equipment ?  ?  ?Sit to sidelying: Min guard, +2 for safety/equipment, +2 for physical assistance ?General bed mobility comments: Min A for assist with rolling and trunk elevation to come to sitting. Increased pain and required increased time. ?  ? ?Transfers ?Overall transfer level: Needs assistance ?Equipment used: 2 person hand held assist ?Transfers: Sit to/from Stand ?Sit to Stand: Min assist, +2 physical assistance ?  ?  ?  ?  ?  ?General transfer comment: Min A +2 for lift assist to come to standing. Pt reporting increased pain and unable to bear weight through RLE, so further mobility deferred. ?  ? ?  ?Balance Overall balance assessment: Needs assistance ?Sitting-balance support: Feet supported, Bilateral upper extremity supported ?Sitting balance-Leahy Scale: Fair ?  ?  ?Standing balance support: Bilateral upper extremity supported, During functional activity ?Standing balance-Leahy Scale: Poor ?Standing balance comment: Reliant on BUE support ?  ?  ?  ?  ?  ?  ?  ?  ?  ?  ?  ?   ? ?ADL either performed or assessed with clinical judgement  ? ?ADL Overall ADL's : Needs assistance/impaired ?  ?  ?Grooming: Set up;Sitting ?  ?  ?  ?  ?  ?Upper Body Dressing : Min guard;Sitting ?  ?Lower Body Dressing: Moderate assistance;+2 for safety/equipment;Sit to/from stand ?  ?  ?Toilet Transfer Details (indicate cue type and reason): unable ?  ?  ?  ?  ?  Functional mobility during ADLs: Minimal assistance;+2 for safety/equipment;Cueing for safety ?General ADL Comments: pt limited by pain  ? ? ? ?Vision   ?   ?   ?Perception   ?  ?Praxis   ?  ? ?Pertinent Vitals/Pain Pain Assessment ?Pain Assessment:  0-10 ?Pain Score: 7  ?Pain Location: back ?Pain Descriptors / Indicators: Grimacing, Guarding ?Pain Intervention(s): Limited activity within patient's tolerance, Monitored during session, Repositioned  ? ? ? ?Hand Dominance   ?  ?Extremity/Trunk Assessment Upper Extremity Assessment ?Upper Extremity Assessment: Overall WFL for tasks assessed ?  ?Lower Extremity Assessment ?Lower Extremity Assessment: Defer to PT evaluation ?RLE Deficits / Details: Reports sharp pain at times. Was unable to bear weight through RLE ?  ?Cervical / Trunk Assessment ?Cervical / Trunk Assessment: Other exceptions ?Cervical / Trunk Exceptions: back pain ?  ?Communication Communication ?Communication: No difficulties ?  ?Cognition Arousal/Alertness: Awake/alert ?Behavior During Therapy: [facility] for tasks assessed/performed ?Overall Cognitive Status: Within Functional Limits for tasks assessed ?  ?  ?  ?  ?  ?  ?  ?  ?  ?  ?  ?  ?  ?  ?  ?  ?  ?  ?  ?General Comments  limited by pain ? ?  ?Exercises   ?  ?Shoulder Instructions    ? ? ?Home Living Family/patient expects to be discharged to:: Private residence ?Living Arrangements: Parent ?Available Help at Discharge: Family;Available 24 hours/day ?Type of Home: House ?Home Access: Stairs to enter ?Entrance Stairs-Number of Steps: 1 ?Entrance Stairs-Rails: None ?Home Layout: One level ?  ?  ?Bathroom Shower/Tub: Gaffer;Tub/shower unit ?  ?Bathroom Toilet: Standard ?  ?  ?Home Equipment: Conservation officer, nature (2 wheels);Shower seat;BSC/3in1 ?  ?  ?  ? ?  ?Prior Functioning/Environment Prior Level of Function : Independent/Modified Independent ?  ?  ?  ?  ?  ?  ?  ?  ?  ? ?  ?  ?OT Problem List: Decreased strength;Decreased activity tolerance;Impaired balance (sitting and/or standing);Decreased knowledge of use of DME or AE;Decreased knowledge of precautions;Pain ?  ?   ?OT Treatment/Interventions: Self-care/ADL training;Therapeutic exercise;DME and/or AE instruction;Therapeutic  activities;Patient/family education;Balance training  ?  ?OT Goals(Current goals can be found in the care plan section) Acute Rehab OT Goals ?Patient Stated Goal: less pain ?OT Goal Formulation: With patient ?Time For Goal Achievement: 03/06/22 ?Potential to Achieve Goals: Fair  ?OT Frequency: Min 2X/week ?  ? ?Co-evaluation PT/OT/SLP Co-Evaluation/Treatment: Yes ?Reason for Co-Treatment: For patient/therapist safety;To address functional/ADL transfers ?PT goals addressed during session: Mobility/safety with mobility;Balance ?OT goals addressed during session: ADL's and self-care ?  ? ?  ?AM-PAC OT "6 Clicks" Daily Activity     ?Outcome Measure Help from another person eating meals?: None ?Help from another person taking care of personal grooming?: A Little ?Help from another person toileting, which includes using toliet, bedpan, or urinal?: A Lot ?Help from another person bathing (including washing, rinsing, drying)?: A Lot ?Help from another person to put on and taking off regular upper body clothing?: A Little ?Help from another person to put on and taking off regular lower body clothing?: A Lot ?6 Click Score: 16 ?  ?End of Session Equipment Utilized During Treatment: Gait belt ?Nurse Communication: Mobility status ? ?Activity Tolerance: Patient tolerated treatment well ?Patient left: in bed;with call bell/phone within reach ? ?OT Visit Diagnosis: Other abnormalities of gait and mobility (R26.89);Muscle weakness (generalized) (M62.81);Pain ?Pain - Right/Left: Right ?Pain - part of body:  (back)  ?              ?  Time: [id number] ?OT Time Calculation (min): 20 min ?Charges:  OT General Charges ?$OT Visit: 1 Visit ?OT Evaluation ?$OT Eval Moderate Complexity: 1 Mod ? ?Jolaine Artist, OT ?Acute Rehabilitation Services ?Pager [phone] ?Office [phone] ? ? ?Delight Stare ?02/20/2022, 12:54 PM ?

## 2022-02-20 NOTE — ED Notes (Signed)
Pt stated that she is unable to ambulate self due to severe pain. While this RN assisted pt with minor repositioning in bed, pt became tearful with pain and clutched bed railings. ED provider made aware ?

## 2022-02-21 DIAGNOSIS — Z20822 Contact with and (suspected) exposure to covid-19: Secondary | ICD-10-CM | POA: Diagnosis present

## 2022-02-21 DIAGNOSIS — M5459 Other low back pain: Secondary | ICD-10-CM

## 2022-02-21 DIAGNOSIS — R29898 Other symptoms and signs involving the musculoskeletal system: Secondary | ICD-10-CM

## 2022-02-21 DIAGNOSIS — F909 Attention-deficit hyperactivity disorder, unspecified type: Secondary | ICD-10-CM | POA: Diagnosis present

## 2022-02-21 DIAGNOSIS — Z885 Allergy status to narcotic agent status: Secondary | ICD-10-CM | POA: Diagnosis not present

## 2022-02-21 DIAGNOSIS — Z79899 Other long term (current) drug therapy: Secondary | ICD-10-CM | POA: Diagnosis not present

## 2022-02-21 DIAGNOSIS — F17290 Nicotine dependence, other tobacco product, uncomplicated: Secondary | ICD-10-CM | POA: Diagnosis present

## 2022-02-21 DIAGNOSIS — M5431 Sciatica, right side: Secondary | ICD-10-CM | POA: Diagnosis present

## 2022-02-21 DIAGNOSIS — M5410 Radiculopathy, site unspecified: Secondary | ICD-10-CM | POA: Diagnosis not present

## 2022-02-21 DIAGNOSIS — M5430 Sciatica, unspecified side: Secondary | ICD-10-CM | POA: Diagnosis present

## 2022-02-21 DIAGNOSIS — R55 Syncope and collapse: Secondary | ICD-10-CM | POA: Diagnosis present

## 2022-02-21 DIAGNOSIS — Z87442 Personal history of urinary calculi: Secondary | ICD-10-CM | POA: Diagnosis not present

## 2022-02-21 DIAGNOSIS — M5126 Other intervertebral disc displacement, lumbar region: Secondary | ICD-10-CM | POA: Diagnosis present

## 2022-02-21 DIAGNOSIS — Z888 Allergy status to other drugs, medicaments and biological substances status: Secondary | ICD-10-CM | POA: Diagnosis not present

## 2022-02-21 DIAGNOSIS — F411 Generalized anxiety disorder: Secondary | ICD-10-CM | POA: Diagnosis not present

## 2022-02-21 DIAGNOSIS — N739 Female pelvic inflammatory disease, unspecified: Secondary | ICD-10-CM | POA: Diagnosis present

## 2022-02-21 DIAGNOSIS — E876 Hypokalemia: Secondary | ICD-10-CM | POA: Diagnosis not present

## 2022-02-21 DIAGNOSIS — Z7952 Long term (current) use of systemic steroids: Secondary | ICD-10-CM | POA: Diagnosis not present

## 2022-02-21 DIAGNOSIS — J45909 Unspecified asthma, uncomplicated: Secondary | ICD-10-CM | POA: Diagnosis present

## 2022-02-21 LAB — CBC
HCT: 37.9 % (ref 36.0–46.0)
Hemoglobin: 12.6 g/dL (ref 12.0–15.0)
MCH: 29.9 pg (ref 26.0–34.0)
MCHC: 33.2 g/dL (ref 30.0–36.0)
MCV: 90 fL (ref 80.0–100.0)
Platelets: 174 10*3/uL (ref 150–400)
RBC: 4.21 MIL/uL (ref 3.87–5.11)
RDW: 11.7 % (ref 11.5–15.5)
WBC: 9.7 10*3/uL (ref 4.0–10.5)
nRBC: 0 % (ref 0.0–0.2)

## 2022-02-21 LAB — BASIC METABOLIC PANEL
Anion gap: 5 (ref 5–15)
BUN: 6 mg/dL (ref 6–20)
CO2: 22 mmol/L (ref 22–32)
Calcium: 8.7 mg/dL — ABNORMAL LOW (ref 8.9–10.3)
Chloride: 112 mmol/L — ABNORMAL HIGH (ref 98–111)
Creatinine, Ser: 0.7 mg/dL (ref 0.44–1.00)
GFR, Estimated: >60 mL/min (ref >60)
Glucose, Bld: 110 mg/dL — ABNORMAL HIGH (ref 70–99)
Potassium: 3.7 mmol/L (ref 3.5–5.1)
Sodium: 139 mmol/L (ref 135–145)

## 2022-02-21 MED ORDER — LIP MEDEX EX OINT
TOPICAL_OINTMENT | CUTANEOUS | Status: DC | PRN
  Administered 2022-02-21: 75 via TOPICAL
  Filled 2022-02-21: qty 7
  Filled 2022-02-21: qty 6.3

## 2022-02-21 NOTE — TOC Progression Note (Addendum)
Transition of Care (TOC) - Progression Note  ? ? ?Patient Details  ?Name: Abigail Harding ?MRN: [id number] ?Date of Birth: 1991/03/14 ? ?Transition of Care (TOC) CM/SW Contact  ?Kingsley Plan, RN ?Phone Number: ?02/21/2022, 4:11 PM ? ?Clinical Narrative:    ? ?Spoke to patient at bedside. Confirmed face sheet information. Patient from home with parents and boyfriend.  ? ?Discussed PT recommendation for OP PT , walker and wheel chair.  ? ?Patient states her  neurosurgeon Dr. Christain Sacramento of [facility] sent a referral for OP [facility] to [facility] [phone] but she has not heard from them. She wants to follow up with Dr. Christain Sacramento before starting OP PT. NCM called [facility] [phone] spoke to [place]. Richardson Dopp confirmed they do have a referral and he will follow up with patient and DR Christain Sacramento . ? ? ?Patient has a walker already at home.  ? ?Patient needs wheel chair. NCM placed order and called [facility]  ? ? ? ? ? ?Expected Discharge Plan: Home/Self Care ?Barriers to Discharge: Continued Medical Work up ? ?Expected Discharge Plan and Services ?Expected Discharge Plan: Home/Self Care ?  ?Discharge Planning Services: CM Consult ?Post Acute Care Choice:  (OP PT) ?Living arrangements for the past 2 months: Single Family Home ?                ?DME Arranged: Wheelchair manual ?DME Agency: [facility] ?Date DME Agency Contacted: 02/21/22 ?Time DME Agency Contacted: [phone] ?Representative spoke with at DME Agency: Selena Batten ?HH Arranged: NA ?  ?  ?  ?  ? ? ?Social Determinants of Health (SDOH) Interventions ?  ? ?Readmission Risk Interventions ?   ? View : No data to display.  ?  ?  ?  ? ? ?

## 2022-02-21 NOTE — Progress Notes (Signed)
?PROGRESS NOTE ? ? ? Abigail Harding  [id number] DOB: October 21, 1991 DOA: 02/19/2022 ?PCP: [facility]  ? ? ? ?Brief Narrative:  ?31 y.o. WF PMHx Anxiety, nephrolithiasis, nonepileptic events, pelvic pain, and Chronic Back pain  ? ?Presents with complaints of lower back pain last night.  She had been receiving a massage by her boyfriend who was sitting on the lower part of her buttocks and leg.  He had been rubbing her shoulders at the time and after he was done got up, but she reports that he never was standing on her back.  When she tried to push herself up felt a sharp stabbing pain radiating across her lower back and all the way down her right leg.  Pain was severe and she was unable to get up.  Pain across her lower back is constant at this time and any kind of movement worsen symptoms.  She is not able to lay on her right hip due to the pain.  Reported having episode where she felt numbness in the bilateral upper extremities as well as the bilateral lower extremities.  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 Octavio Manns of [facility] in June 2022.  Reported trying to use a lidocaine patch without any relief.  She does report having some nausea related to pain medications given.  Denies any recent fever, saddle anesthesia, inability to void, or chest pain.  She does vape, admit to using marijuana, and occasional use of alcohol. ?  ?On admission to the emergency department patient was noted to have stable vital signs.  Labs significant for i-STAT potassium of 3.4.  Initial x-rays of the lumbar spine were negative.  Urinalysis did not note any acute abnormalities.  MRI of the lumbar spine noted medium sized central disc extrusion with inferior migration at L4-L5 causing narrowing of the right lateral recess with displacement of the right L5 root.  She had received multiple rounds of pain  medication, Flexeril, and prednisone without improvement in symptoms. ?  ? ? ?Subjective: ?A/O x4, positive weak/painful RLE.  Sitting in chair ? ? ?Assessment & Plan: ?Covid vaccination; ?  ?Principal Problem: ?  Radicular low back pain ?Active Problems: ?  Generalized anxiety disorder ?  Hypokalemia ? ?Radicular low back pain/L4-L5 disc extrusion ?Patient presents with complaints of acute lower back pain with radiation down her right leg.  MRI significant for medium size central disc extrusion with inferior migration at L4-5 causing narrowing of the right lateral recess with displacement of the right L5 nerve root.  Patient had been given  pain medication without improvement in pain symptoms.  She has prior history of L4-L5 microdissection with Dr. Octavio Manns in 04/2021. ?-Admit to a MedSurg bed ?-Oxycodone/fentanyl as needed for moderate to severe pain ?-Lidocaine patch ?-Continue prednisone ?-Lumbar corset while out of bed ?-PT to evaluate and treat ?-Discussed case including MRI with patient's neurosurgeon Dr. Octavio Manns of [facility] who agreed with pain control.  Recommended patient to follow-up with him in the outpatient setting as she would likely need revision at some point in time. ?-4/11 will contact Dr. Octavio Manns of [place]: Does he want patient transferred directly to [place] for surgery or would you like her to keep follow-up appointment on Thursday@1515  ?  ?Hypokalemia ?Acute.  Initial potassium 3.4. ?-Give  potassium chloride 20 mEq  ?-Resolved ? ?Generalized anxiety disorder ?-Klonopin 0.5 mg as needed  for anxiety ?-Trazodone 25 mg qhs ?  ? ? ?  ? ?Mobility Assessment (last 72 hours)   ? ? Mobility Assessment   ? ? [place] Name 02/20/22 2104 02/20/22 1000 02/20/22 0956  ?  ?  ? Does patient have an order for bedrest or is patient medically unstable No - Continue assessment -- --    ? What is the highest level of mobility based on the progressive mobility assessment? Level 1  (Bedfast) - Unable to balance while sitting on edge of bed Level 2 (Chairfast) - Balance while sitting on edge of bed and cannot stand Level 2 (Chairfast) - Balance while sitting on edge of bed and cannot stand    ? ?  ?  ? ?  ? ? ?DVT prophylaxis: Lovenox ?Code Status: Full ?Family Communication:  ?Status is: Inpatient ? ? ? ?Dispo: The patient is from: Home ?             Anticipated d/c is to: Home ?             Anticipated d/c date is: 2 days ?             Patient currently is not medically stable to d/c. ? ? ? ? ? ?Consultants:  ?Dr. Arsenio Katz Neurosurgeon [facility] ? ? ?Procedures/Significant Events:  ?4/10 MRI spine [facility] contrast:Medium-sized central disc extrusion with inferior migration at ?L4-L5 causing narrowing of the right lateral recess with ?displacement of the right L5 nerve root. Correlate for right L5 ?radiculopathy. ? ? ? ?I have personally reviewed and interpreted all radiology studies and my findings are as above. ? ?VENTILATOR SETTINGS: ? ? ? ?Cultures ? ? ?Antimicrobials: ? ? ? ?Devices ?  ? ?LINES / TUBES:  ? ? ? ? ?Continuous Infusions: ? promethazine (PHENERGAN) injection (IM or IVPB) 12.5 mg (02/21/22 1020)  ? ? ? ?Objective: ?Vitals:  ? 02/20/22 1749 02/20/22 1751 02/20/22 2104 02/21/22 0747  ?BP:  104/63 112/65 (!) 109/54  ?Pulse: 63 67 67 67  ?Resp:  19 18 18   ?Temp:  98.4 ?F (36.9 ?C) 98.7 ?F (37.1 ?C) 98.2 ?F (36.8 ?C)  ?TempSrc:  Oral Oral Oral  ?SpO2: 100% 100% 100% 100%  ?Weight:      ?Height:      ? ? ?Intake/Output Summary (Last 24 hours) at 02/21/2022 1235 ?Last data filed at 02/21/2022 0800 ?Gross per 24 hour  ?Intake 50 ml  ?Output 800 ml  ?Net -750 ml  ? ?Filed Weights  ? 02/19/22 2158  ?Weight: 56.7 kg  ? ? ?Examination: ? ?General: A/O x4, No acute respiratory distress ?Eyes: negative scleral hemorrhage, negative anisocoria, negative icterus ?ENT: Negative Runny nose, negative gingival bleeding, ?Neck:  Negative scars, masses, torticollis, lymphadenopathy,  JVD ?Lungs: Clear to auscultation bilaterally without wheezes or crackles ?Cardiovascular: Regular rate and rhythm without murmur gallop or rub normal S1 and S2 ?Abdomen: negative abdominal pain, nondistended, positive soft, bowel sounds, no rebound, no ascites, no appreciable mass ?Extremities: No significant cyanosis, clubbing, or edema bilateral lower extremities ?Skin: Negative rashes, lesions, ulcers ?Psychiatric:  Negative depression, negative anxiety, negative fatigue, negative mania  ?Central nervous system:  Cranial nerves II through XII intact, tongue/uvula midline, all extremities muscle strength 5/5, except RLE strength 4/5, sensation intact throughout, except decreased sensation RLE negative dysarthria, negative expressive aphasia, negative receptive aphasia. ? ?.  ? ? ? ?Data Reviewed: Care during the described time interval was provided by me .  I have reviewed this  patient's available data, including medical history, events of note, physical examination, and all test results as part of my evaluation. ? ?CBC: ?Recent Labs  ?Lab 02/20/22 ?0218 02/20/22 ?0232 02/21/22 ?[id number]  ?WBC 9.9  --  9.7  ?NEUTROABS 6.6  --   --   ?HGB 13.8 13.6 12.6  ?HCT 40.1 40.0 37.9  ?MCV 89.9  --  90.0  ?PLT 199  --  174  ? ?Basic Metabolic Panel: ?Recent Labs  ?Lab 02/20/22 ?0232 02/21/22 ?[id number]  ?NA 141 139  ?K 3.4* 3.7  ?CL 104 112*  ?CO2  --  22  ?GLUCOSE 126* 110*  ?BUN 8 6  ?CREATININE 0.80 0.70  ?CALCIUM  --  8.7*  ? ?GFR: ?Estimated Creatinine Clearance: 90.4 mL/min (by C-G formula based on SCr of 0.7 mg/dL). ?Liver Function Tests: ?No results for input(s): AST, ALT, ALKPHOS, BILITOT, PROT, ALBUMIN in the last 168 hours. ?No results for input(s): LIPASE, AMYLASE in the last 168 hours. ?No results for input(s): AMMONIA in the last 168 hours. ?Coagulation Profile: ?No results for input(s): INR, PROTIME in the last 168 hours. ?Cardiac Enzymes: ?No results for input(s): CKTOTAL, CKMB, CKMBINDEX, TROPONINI in the last 168  hours. ?BNP (last 3 results) ?No results for input(s): PROBNP in the last 8760 hours. ?HbA1C: ?No results for input(s): HGBA1C in the last 72 hours. ?CBG: ?No results for input(s): GLUCAP in the last 168 hours. ?Lipid

## 2022-02-21 NOTE — TOC CM/SW Note (Signed)
?  ?  Durable Medical Equipment  ?(From admission, onward)  ?  ? ? ?  ? ?  Start     Ordered  ? 02/21/22 1548  For home use only DME standard manual wheelchair with seat cushion  Once       ?Comments: Patient suffers from  Radicular low back pain which impairs their ability to perform daily activities like ambulating  in the home.  A cane  will not resolve issue with performing activities of daily living. A wheelchair will allow patient to safely perform daily activities. Patient can safely propel the wheelchair in the home or has a caregiver who can provide assistance. Length of need lifetime . ?Accessories: elevating leg rests (ELRs), wheel locks, extensions and anti-tippers. ? ?Seat and back cushions  ? 02/21/22 1548  ? 02/21/22 1547  For home use only DME Walker rolling  Once       ?Question Answer Comment  ?Walker: With 5 Inch Wheels   ?Patient needs a walker to treat with the following condition Weakness   ?  ? 02/21/22 1548  ? ?  ?  ? ?  ?  ?

## 2022-02-21 NOTE — Progress Notes (Signed)
Physical Therapy Treatment ?Patient Details ?Name: Abigail Harding ?MRN: [id number] ?DOB: October 06, 1991 ?Today's Date: 02/21/2022 ? ? ?History of Present Illness Pt is a 31 y.o. female admitted 02/19/22 with severe back pain that radiates into RLE. Lumbar MRI showed medium-sized central disc extrusion with inferior migration at L4-L5 causing narrowing of R lateral recess with displacement of the right L5 root. PMH includes ovarian cyst, prior lumbar surgery (04/2021). ?  ?PT Comments  ? ? Pt slowly progressing with mobility. Today's session focused on bed mobility, transfer and gait training; pt tolerated ambulating 28' RW, requires up to [facility] for mobility and assist for various ADL tasks. Pt remains limited by significant pain, but does note some relief when lumbar brace donned. Pt concerned regarding back pain since this is similar area she had lumbar sx on 04/2021. Feel pt would benefit from outpatient PT to maximize functional mobility and independence upon return home. ?   ?Recommendations for follow up therapy are one component of a multi-disciplinary discharge planning process, led by the attending physician.  Recommendations may be updated based on patient status, additional functional criteria and insurance authorization. ? ?Follow Up Recommendations ? Outpatient PT ?  ?  ?Assistance Recommended at Discharge Intermittent Supervision/Assistance  ?Patient can return home with the following A little help with walking and/or transfers;A little help with bathing/dressing/bathroom;Assistance with cooking/housework;Help with stairs or ramp for entrance;Assist for transportation ?  ?Equipment Recommendations ? Wheelchair (measurements PT);Wheelchair cushion (measurements PT);Rolling walker (2 wheels)  ?  ?Recommendations for Other Services  Mobility Specialist ? ? ?  ?Precautions / Restrictions Precautions ?Precautions: Fall;Back ?Precaution Comments: back for comfort ?Required Braces or Orthoses: Spinal Brace ?Spinal Brace:  Lumbar corset (for ambulation) ?Restrictions ?Weight Bearing Restrictions: No  ?  ? ?Mobility ? Bed Mobility ?Overal bed mobility: Needs Assistance ?Bed Mobility: Supine to Sit ?  ?  ?Supine to sit: Mod assist, HOB elevated ?  ?  ?General bed mobility comments: pt attempted log roll with encouragement but reports too much pain in RLE requiring return to supine; pt adamant about performing bed mobility with HOB elevated and HHA to pull trunk to EOB, requiring modA for HHA to do this, then pt able to manage RLE with use of RUE ?  ? ?Transfers ?Overall transfer level: Needs assistance ?Equipment used: Rolling walker (2 wheels) ?Transfers: Sit to/from Stand ?Sit to Stand: Min guard ?  ?  ?  ?  ?  ?General transfer comment: increased time and effort with all sit<>stands to RW, pt adamant about pushing up with BUE support on RW due to RLE pain, able to do so from EOB, BSC (over toilet) and recliner; min guard for balance ?  ? ?Ambulation/Gait ?Ambulation/Gait assistance: Min guard ?Gait Distance (Feet): 28 Feet ?Assistive device: Rolling walker (2 wheels) ?Gait Pattern/deviations: Step-to pattern, Step-through pattern, Decreased stride length, Decreased dorsiflexion - right, Decreased weight shift to right, Knee flexed in stance - right, Trunk flexed, Antalgic ?Gait velocity: Decreased ?  ?  ?General Gait Details: Significantly slowed, antalgic gait with RW and min guard for balance; encouraged RLE WBAT as pt with heavy reliance on BUE support; improved ability to weight shift and step through with RLE with distance; further distance limited by pain and fatigue ? ? ?Stairs ?  ?  ?  ?  ?  ? ? ?Wheelchair Mobility ?  ? ?Modified Rankin (Stroke Patients Only) ?  ? ? ?  ?Balance Overall balance assessment: Needs assistance ?Sitting-balance support: No upper extremity supported, Feet  supported ?Sitting balance-Leahy Scale: Fair ?  ?  ?Standing balance support: Bilateral upper extremity supported, During functional  activity ?Standing balance-Leahy Scale: Poor ?Standing balance comment: Reliant on BUE support ?  ?  ?  ?  ?  ?  ?  ?  ?  ?  ?  ?  ? ?  ?Cognition Arousal/Alertness: Awake/alert ?Behavior During Therapy: [facility] for tasks assessed/performed ?Overall Cognitive Status: Within Functional Limits for tasks assessed ?  ?  ?  ?  ?  ?  ?  ?  ?  ?  ?  ?  ?  ?  ?  ?  ?  ?  ?  ? ?  ?Exercises   ? ?  ?General Comments General comments (skin integrity, edema, etc.): brace adjusted and utilized this session, pt reports noting improvement in pain with brace donned. pt able to get toilet paper and wipe while seated on toilet, but requires assist to pull up shorts upon standing. pt unable to tolerate RLE extended on recliner leg rest, requiring pillow place to rest R knee in flexion ?  ?  ? ?Pertinent Vitals/Pain Pain Assessment ?Pain Assessment: Faces ?Faces Pain Scale: Hurts whole lot ?Pain Location: back ?Pain Descriptors / Indicators: Grimacing, Guarding ?Pain Intervention(s): Premedicated before session, Monitored during session, Ice applied, Repositioned  ? ? ?Home Living   ?  ?  ?  ?  ?  ?  ?  ?  ?  ?   ?  ?Prior Function    ?  ?  ?   ? ?PT Goals (current goals can now be found in the care plan section) Progress towards PT goals: Progressing toward goals ? ?  ?Frequency ? ? ? Min 5X/week ? ? ? ?  ?PT Plan Frequency needs to be updated;Equipment recommendations need to be updated;Discharge plan needs to be updated  ? ? ?Co-evaluation   ?  ?  ?  ?  ? ?  ?AM-PAC PT "6 Clicks" Mobility   ?Outcome Measure ? Help needed turning from your back to your side while in a flat bed without using bedrails?: A Little ?Help needed moving from lying on your back to sitting on the side of a flat bed without using bedrails?: A Lot ?Help needed moving to and from a bed to a chair (including a wheelchair)?: A Lot ?Help needed standing up from a chair using your arms (e.g., wheelchair or bedside chair)?: A Lot ?Help needed to walk in hospital room?: A  Little ?Help needed climbing 3-5 steps with a railing? : A Lot ?6 Click Score: 14 ? ?  ?End of Session Equipment Utilized During Treatment: Gait belt;Back brace ?Activity Tolerance: Patient limited by pain ?Patient left: in chair;with call bell/phone within reach;with chair alarm set ?Nurse Communication: Mobility status ?PT Visit Diagnosis: Other abnormalities of gait and mobility (R26.89);Difficulty in walking, not elsewhere classified (R26.2);Pain ?  ? ? ?Time: [id number] ?PT Time Calculation (min) (ACUTE ONLY): 43 min ? ?Charges:  $Gait Training: 8-22 mins ?$Therapeutic Activity: 23-37 mins          ?          ? ?Mabeline Caras, PT, DPT ?Acute Rehabilitation Services  ?Pager [phone] ?Office [phone] ? ?Derry Lory ?02/21/2022, 3:30 PM ? ?

## 2022-02-21 NOTE — TOC Benefit Eligibility Note (Signed)
Transition of Care (TOC) Benefit Eligibility Note  ? ? ?Patient Details  ?Name: Abigail Harding ?MRN: [id number] ?Date of Birth: [birth year] ? ? ?  ? ?  ? ?  ? ?  ? ?Spoke with Person/Company/Phone Number:: [name] Ph# [phone] ? ?  ? ?  ? ?  ? ?Additional Notes: HOME HEALTH AGENCIES: [facility],[employer] [phone] [facility] Ph# [phone], ? ? ? ?[name] ?Phone Number: ?02/21/2022, 10:42 AM ? ? ? ? ?

## 2022-02-22 DIAGNOSIS — M5431 Sciatica, right side: Secondary | ICD-10-CM

## 2022-02-22 LAB — CBC WITH DIFFERENTIAL/PLATELET
Abs Immature Granulocytes: 0.03 10*3/uL (ref 0.00–0.07)
Basophils Absolute: 0 10*3/uL (ref 0.0–0.1)
Basophils Relative: 0 %
Eosinophils Absolute: 0 10*3/uL (ref 0.0–0.5)
Eosinophils Relative: 0 %
HCT: 37.7 % (ref 36.0–46.0)
Hemoglobin: 12.6 g/dL (ref 12.0–15.0)
Immature Granulocytes: 0 %
Lymphocytes Relative: 19 %
Lymphs Abs: 1.8 10*3/uL (ref 0.7–4.0)
MCH: 30.2 pg (ref 26.0–34.0)
MCHC: 33.4 g/dL (ref 30.0–36.0)
MCV: 90.4 fL (ref 80.0–100.0)
Monocytes Absolute: 0.9 10*3/uL (ref 0.1–1.0)
Monocytes Relative: 9 %
Neutro Abs: 6.6 10*3/uL (ref 1.7–7.7)
Neutrophils Relative %: 72 %
Platelets: 177 10*3/uL (ref 150–400)
RBC: 4.17 MIL/uL (ref 3.87–5.11)
RDW: 11.9 % (ref 11.5–15.5)
WBC: 9.4 10*3/uL (ref 4.0–10.5)
nRBC: 0 % (ref 0.0–0.2)

## 2022-02-22 LAB — COMPREHENSIVE METABOLIC PANEL
ALT: 6 U/L (ref 0–44)
AST: 14 U/L — ABNORMAL LOW (ref 15–41)
Albumin: 3.7 g/dL (ref 3.5–5.0)
Alkaline Phosphatase: 31 U/L — ABNORMAL LOW (ref 38–126)
Anion gap: 5 (ref 5–15)
BUN: 6 mg/dL (ref 6–20)
CO2: 27 mmol/L (ref 22–32)
Calcium: 8.9 mg/dL (ref 8.9–10.3)
Chloride: 108 mmol/L (ref 98–111)
Creatinine, Ser: 0.69 mg/dL (ref 0.44–1.00)
GFR, Estimated: >60 mL/min (ref >60)
Glucose, Bld: 116 mg/dL — ABNORMAL HIGH (ref 70–99)
Potassium: 3.6 mmol/L (ref 3.5–5.1)
Sodium: 140 mmol/L (ref 135–145)
Total Bilirubin: 0.3 mg/dL (ref 0.3–1.2)
Total Protein: 6.2 g/dL — ABNORMAL LOW (ref 6.5–8.1)

## 2022-02-22 LAB — PHOSPHORUS: Phosphorus: 4.5 mg/dL (ref 2.5–4.6)

## 2022-02-22 LAB — MAGNESIUM: Magnesium: 2.1 mg/dL (ref 1.7–2.4)

## 2022-02-22 MED ORDER — OXYCODONE-ACETAMINOPHEN 5-325 MG PO TABS: 1.0000 | ORAL_TABLET | Freq: Four times a day (QID) | ORAL | 0 refills | Status: DC | PRN

## 2022-02-22 MED ORDER — TRAZODONE HCL 50 MG PO TABS: 25.0000 mg | ORAL_TABLET | Freq: Every evening | ORAL | 0 refills | Status: DC | PRN

## 2022-02-22 MED ORDER — CLONAZEPAM 0.5 MG PO TABS: 0.5000 mg | ORAL_TABLET | Freq: Every day | ORAL | 0 refills | Status: DC | PRN

## 2022-02-22 NOTE — Progress Notes (Signed)
Physical Therapy Treatment ?Patient Details ?Name: Abigail Harding ?MRN: [id number] ?DOB: 1991/08/23 ?Today's Date: 02/22/2022 ? ? ?History of Present Illness Pt is a 31 y.o. female admitted 02/19/22 with severe back pain that radiates into RLE. Lumbar MRI showed medium-sized central disc extrusion with inferior migration at L4-L5 causing narrowing of R lateral recess with displacement of the right L5 root. PMH includes ovarian cyst, prior lumbar surgery (04/2021). ?  ?PT Comments  ? ? Pt progressing with mobility. Today's session focused on transfer and wheelchair training. Pt continues to be limited by c/o significant back and RLE pain, does not some improvement in symptoms when lumbar corset donned. Pt happy to be able to utilize w/c to mobilize out of room. If to remain admitted, will continue to follow acutely to address established goals.  ?   ?Recommendations for follow up therapy are one component of a multi-disciplinary discharge planning process, led by the attending physician.  Recommendations may be updated based on patient status, additional functional criteria and insurance authorization. ? ?Follow Up Recommendations ? Outpatient PT ?  ?  ?Assistance Recommended at Discharge Intermittent Supervision/Assistance  ?Patient can return home with the following A little help with walking and/or transfers;A little help with bathing/dressing/bathroom;Assistance with cooking/housework;Help with stairs or ramp for entrance;Assist for transportation ?  ?Equipment Recommendations ? None recommended by PT (w/c delivered, owns RW)  ?  ?Recommendations for Other Services   ? ? ?  ?Precautions / Restrictions Precautions ?Precautions: Fall;Back ?Precaution Comments: back for comfort ?Required Braces or Orthoses: Spinal Brace ?Spinal Brace: Lumbar corset (for ambulation) ?Restrictions ?Weight Bearing Restrictions: No  ?  ? ?Mobility ? Bed Mobility ?Overal bed mobility: Modified Independent ?  ?  ?  ?  ?  ?  ?General bed  mobility comments: mod indep to come to sitting on R-side of bed with HOB significantly elevated, increased time and effort with use of BUE support to manage RLE ?  ? ?Transfers ?Overall transfer level: Needs assistance ?Equipment used: Rolling walker (2 wheels) ?Transfers: Sit to/from Stand, Bed to chair/wheelchair/BSC ?Sit to Stand: Supervision ?  ?Step pivot transfers: Supervision ?  ?  ?  ?General transfer comment: increased time and effort, pt able to stand and walk ~4' from bed to w/c with RW and supervision for safety; taking complete steps with BLE ?  ? ?Ambulation/Gait ?  ?  ?  ?  ?  ?  ?  ?  ? ? ?Stairs ?  ?  ?  ?  ?  ? ? ?Wheelchair Mobility ?Wheelchair Mobility ?Wheelchair mobility: Yes ?Wheelchair propulsion: Both upper extremities ?Wheelchair parts: Needs assistance ?Distance: 640 ?Wheelchair Assistance Details (indicate cue type and reason): requires education and assist for set-up, including BLE support on leg rests; mod indep to propel w/c with BUE support, no c/o pain ? ?Modified Rankin (Stroke Patients Only) ?  ? ? ?  ?Balance Overall balance assessment: Needs assistance ?Sitting-balance support: No upper extremity supported, Feet supported ?Sitting balance-Leahy Scale: Fair ?  ?  ?Standing balance support: Bilateral upper extremity supported, During functional activity ?Standing balance-Leahy Scale: Poor ?Standing balance comment: Reliant on BUE support ?  ?  ?  ?  ?  ?  ?  ?  ?  ?  ?  ?  ? ?  ?Cognition Arousal/Alertness: Awake/alert ?Behavior During Therapy: [facility] for tasks assessed/performed ?Overall Cognitive Status: Within Functional Limits for tasks assessed ?  ?  ?  ?  ?  ?  ?  ?  ?  ?  ?  ?  ?  ?  ?  ?  ?  ?  ?  ? ?  ?  Exercises   ? ?  ?General Comments General comments (skin integrity, edema, etc.): pt able to instruct NT in how to assist her in donning brace. pt's wheelchair delivered to room and utilized this session ?  ?  ? ?Pertinent Vitals/Pain Pain Assessment ?Pain Assessment:  Faces ?Faces Pain Scale: Hurts even more ?Pain Location: back ?Pain Descriptors / Indicators: Grimacing, Guarding ?Pain Intervention(s): Monitored during session, Other (comment) (pt reports improved pain with brace donned)  ? ? ?Home Living   ?  ?  ?  ?  ?  ?  ?  ?  ?  ?   ?  ?Prior Function    ?  ?  ?   ? ?PT Goals (current goals can now be found in the care plan section) Progress towards PT goals: Progressing toward goals ? ?  ?Frequency ? ? ? Min 5X/week ? ? ? ?  ?PT Plan Current plan remains appropriate  ? ? ?Co-evaluation   ?  ?  ?  ?  ? ?  ?AM-PAC PT "6 Clicks" Mobility   ?Outcome Measure ? Help needed turning from your back to your side while in a flat bed without using bedrails?: A Little ?Help needed moving from lying on your back to sitting on the side of a flat bed without using bedrails?: A Little ?Help needed moving to and from a bed to a chair (including a wheelchair)?: A Little ?Help needed standing up from a chair using your arms (e.g., wheelchair or bedside chair)?: A Little ?Help needed to walk in hospital room?: A Little ?Help needed climbing 3-5 steps with a railing? : A Lot ?6 Click Score: 17 ? ?  ?End of Session Equipment Utilized During Treatment: Back brace ?Activity Tolerance: Patient tolerated treatment well ?Patient left: Other (comment);with call bell/phone within reach (seated in w/c, RN aware) ?Nurse Communication: Mobility status;Other (comment) (pt's nurse ok with pt propelling w/c around unit) ?PT Visit Diagnosis: Other abnormalities of gait and mobility (R26.89);Difficulty in walking, not elsewhere classified (R26.2);Pain ?Pain - Right/Left: Right ?Pain - part of body: Leg ?  ? ? ?Time: 7654-6503 ?PT Time Calculation (min) (ACUTE ONLY): 24 min ? ?Charges:  $Therapeutic Activity: 8-22 mins ?$Wheel Chair Management: 8-22 mins          ?          ? ?Ina Homes, PT, DPT ?Acute Rehabilitation Services  ?Pager [phone] ?Office [phone] ? ?Malachy Chamber ?02/22/2022, 5:22  PM ? ?

## 2022-02-22 NOTE — Progress Notes (Signed)
Occupational Therapy Treatment ?Patient Details ?Name: Abigail Harding ?MRN: [id number] ?DOB: 07/06/1991 ?Today's Date: 02/22/2022 ? ? ?History of present illness Pt is a 31 y.o. female admitted 02/19/22 with severe back pain that radiates into RLE. Lumbar MRI showed medium-sized central disc extrusion with inferior migration at L4-L5 causing narrowing of R lateral recess with displacement of the right L5 root. PMH includes ovarian cyst, prior lumbar surgery (04/2021). ?  ?OT comments ? Patient in bed upon entry, agreeable to OT session. Complete transfers with min assist using RW, mobility into restroom with min assist using RW.  She relies heavily on UE support of RW due to LE pain, therefore requires max assist for toileting needs.  Pt attempted lateral leans for clothing mgmt, but limited by pain, and only able to bring clothing up minimally on R hip.  Discussed compensatory techniques for LB dressing, pt preference to bed level due to pain.  Ultimately will need assist at this time for clothing mgmt over hips with toileting and LB dressing- pt reports her mom will be able to assist with these tasks. Will follow acutely.   ? ?Recommendations for follow up therapy are one component of a multi-disciplinary discharge planning process, led by the attending physician.  Recommendations may be updated based on patient status, additional functional criteria and insurance authorization. ?   ?Follow Up Recommendations ? Outpatient OT  ?  ?Assistance Recommended at Discharge Intermittent Supervision/Assistance  ?Patient can return home with the following ? A little help with walking and/or transfers;A lot of help with bathing/dressing/bathroom;Assist for transportation;Help with stairs or ramp for entrance;Assistance with cooking/housework ?  ?Equipment Recommendations ? None recommended by OT (has BSC at home)  ?  ?Recommendations for Other Services   ? ?  ?Precautions / Restrictions Precautions ?Precautions:  Fall;Back ?Precaution Comments: back for comfort ?Required Braces or Orthoses: Spinal Brace ?Spinal Brace: Lumbar corset (for ambulation) ?Restrictions ?Weight Bearing Restrictions: No  ? ? ?  ? ?Mobility Bed Mobility ?Overal bed mobility: Needs Assistance ?Bed Mobility: Sit to Supine, Supine to Sit ?  ?  ?Supine to sit: HOB elevated, Mod assist ?Sit to supine: Min guard, HOB elevated ?  ?General bed mobility comments: pt attempted log roll with encouragement but reports too much pain in RLE requiring return to supine; pt adamant about performing bed mobility with HOB elevated and HHA to pull trunk to EOB, requiring modA for HHA to do this, then pt able to manage RLE with use of RUE; returned to bed with increased time but no physical assist ?  ? ?Transfers ?  ?  ?  ?  ?  ?  ?  ?  ?  ?  ?  ?  ?Balance Overall balance assessment: Needs assistance ?Sitting-balance support: No upper extremity supported, Feet supported ?Sitting balance-Leahy Scale: Fair ?  ?  ?Standing balance support: Bilateral upper extremity supported, During functional activity ?Standing balance-Leahy Scale: Poor ?Standing balance comment: Reliant on BUE support ?  ?  ?  ?  ?  ?  ?  ?  ?  ?  ?  ?   ? ?ADL either performed or assessed with clinical judgement  ? ?ADL Overall ADL's : Needs assistance/impaired ?  ?  ?Grooming: Set up;Sitting ?  ?  ?  ?  ?  ?Upper Body Dressing : Set up;Sitting ?Upper Body Dressing Details (indicate cue type and reason): for brace mgmt ?Lower Body Dressing: Moderate assistance;Bed level ?Lower Body Dressing Details (indicate cue type and reason):  reviewed techniques at EOB vs supine, pt preference to supine but will need some assist over hips. ?Toilet Transfer: Minimal assistance;Ambulation;Rolling walker (2 wheels);BSC/3in1 ?Toilet Transfer Details (indicate cue type and reason): 3:1 over commode ?Toileting- Clothing Manipulation and Hygiene: Maximal assistance;Sit to/from stand;Sitting/lateral lean ?Toileting -  Clothing Manipulation Details (indicate cue type and reason): pt able to complete hygiene, lateral leans to pull over R hip with L lateral lean minimally but ultimately needs assist ?  ?  ?Functional mobility during ADLs: Minimal assistance;Rolling walker (2 wheels) ?General ADL Comments: pt limited by pain ?  ? ?Extremity/Trunk Assessment   ?  ?  ?  ?  ?  ? ?Vision   ?  ?  ?Perception   ?  ?Praxis   ?  ? ?Cognition Arousal/Alertness: Awake/alert ?[facility] for tasks assessed/performed ?Overall Cognitive Status: Within Functional Limits for tasks assessed ?  ?  ?  ?  ?  ?  ?  ?  ?  ?  ?  ?  ?  ?  ?  ?  ?  ?  ?  ?   ?Exercises   ? ?  ?Shoulder Instructions   ? ? ?  ?General Comments utilized brace  ? ? ?Pertinent Vitals/ Pain       Pain Assessment ?Pain Assessment: Faces ?Pain Score: 7  ?Faces Pain Scale: Hurts whole lot ?Pain Location: back ?Pain Descriptors / Indicators: Grimacing, Guarding ?Pain Intervention(s): Limited activity within patient's tolerance, Monitored during session, Repositioned ? ?Home Living   ?  ?  ?  ?  ?  ?  ?  ?  ?  ?  ?  ?  ?  ?  ?  ?  ?  ?  ? ?  ?Prior Functioning/Environment    ?  ?  ?  ?   ? ?Frequency ? Min 2X/week  ? ? ? ? ?  ?Progress Toward Goals ? ?OT Goals(current goals can now be found in the care plan section) ? Progress towards OT goals: Progressing toward goals ? ?Acute Rehab OT Goals ?Patient Stated Goal: less pain ?OT Goal Formulation: With patient ?Time For Goal Achievement: 03/06/22 ?Potential to Achieve Goals: Fair  ?Plan Discharge plan remains appropriate;Frequency remains appropriate   ? ?Co-evaluation ? ? ?   ?  ?  ?  ?  ? ?  ?AM-PAC OT "6 Clicks" Daily Activity     ?Outcome Measure ? ? Help from another person eating meals?: None ?Help from another person taking care of personal grooming?: A Little ?Help from another person toileting, which includes using toliet, bedpan, or urinal?: A Lot ?Help from another person bathing (including washing, rinsing,  drying)?: A Lot ?Help from another person to put on and taking off regular upper body clothing?: A Little ?Help from another person to put on and taking off regular lower body clothing?: A Lot ?6 Click Score: 16 ? ?  ?End of Session Equipment Utilized During Treatment: Gait belt;Back brace;Rolling walker (2 wheels) ? ?OT Visit Diagnosis: Other abnormalities of gait and mobility (R26.89);Muscle weakness (generalized) (M62.81);Pain ?Pain - Right/Left: Right ?Pain - part of body:  (back) ?  ?Activity Tolerance Patient tolerated treatment well ?  ?Patient Left in bed;with call bell/phone within reach ?  ?Nurse Communication Mobility status ?  ? ?   ? ?Time: [id number] ?OT Time Calculation (min): 36 min ? ?Charges: OT General Charges ?$OT Visit: 1 Visit ?OT Treatments ?$Self Care/Home Management : 8-22 mins ? ?Barry Brunnerhristie B, OT ?  Acute Rehabilitation Services ?Pager [phone] ?Office [phone] ? ? ?Chancy Milroy ?02/22/2022, 1:39 PM ?

## 2022-02-22 NOTE — Progress Notes (Signed)
Mobility Specialist: Progress Note ? ? 02/22/22 1652  ?Mobility  ?Activity  ?(Wheelchair mobility)  ?Level of Assistance Independent  ?Assistive Device Wheelchair  ?Distance Ambulated (ft) 300 ft  ?Activity Response Tolerated well  ?$Mobility charge 1 Mobility  ? ?Met pt in hallway during wheelchair mobility and pt agreeable for me to join. C/o minimal pain in her back during session but states her back brace has helped with the pain, otherwise no c/o. Pt wishing to continue wheelchair mobility in the hallway. Will f/u tomorrow.  ? ?Harrell Gave Sherion Dooly ?Mobility Specialist ?Mobility Specialist [place]: [phone] ?Mobility Specialist [place]: [phone] ? ?

## 2022-02-22 NOTE — Progress Notes (Incomplete)
?PROGRESS NOTE ? ? ? Abigail Harding  ZOX:[id number] DOB: 08/15/1991 DOA: 02/19/2022 ?PCP: [facility]  ? ? ? ?Brief Narrative:  ?31 y.o. WF PMHx Anxiety, nephrolithiasis, nonepileptic events, pelvic pain, and Chronic Back pain  ? ?Presents with complaints of lower back pain last night.  She had been receiving a massage by her boyfriend who was sitting on the lower part of her buttocks and leg.  He had been rubbing her shoulders at the time and after he was done got up, but she reports that he never was standing on her back.  When she tried to push herself up felt a sharp stabbing pain radiating across her lower back and all the way down her right leg.  Pain was severe and she was unable to get up.  Pain across her lower back is constant at this time and any kind of movement worsen symptoms.  She is not able to lay on her right hip due to the pain.  Reported having episode where she felt numbness in the bilateral upper extremities as well as the bilateral lower extremities.  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 Christain Sacramento of [facility] with [facility] in June 2022.  Reported trying to use a lidocaine patch without any relief.  She does report having some nausea related to pain medications given.  Denies any recent fever, saddle anesthesia, inability to void, or chest pain.  She does vape, admit to using marijuana, and occasional use of alcohol. ?  ?On admission to the emergency department patient was noted to have stable vital signs.  Labs significant for i-STAT potassium of 3.4.  Initial x-rays of the lumbar spine were negative.  Urinalysis did not note any acute abnormalities.  MRI of the lumbar spine noted medium sized central disc extrusion with inferior migration at L4-L5 causing narrowing of the right lateral recess with displacement of the right L5 root.  She had received multiple rounds of pain  medication, Flexeril, and prednisone without improvement in symptoms. ?  ? ? ?Subjective: ?4/12 afebrile overnight ? ? ? ? ?Assessment & Plan: ?Covid vaccination; ?  ?Principal Problem: ?  Radicular low back pain ?Active Problems: ?  Generalized anxiety disorder ?  Hypokalemia ?  Sciatica ? ?Radicular low back pain/L4-L5 disc extrusion ?Patient presents with complaints of acute lower back pain with radiation down her right leg.  MRI significant for medium size central disc extrusion with inferior migration at L4-5 causing narrowing of the right lateral recess with displacement of the right L5 nerve root.  Patient had been given  pain medication without improvement in pain symptoms.  She has prior history of L4-L5 microdissection with Dr. Christain Sacramento in 04/2021. ?-Admit to a MedSurg bed ?-Oxycodone/fentanyl as needed for moderate to severe pain ?-Lidocaine patch ?-Continue prednisone ?-Lumbar corset while out of bed ?-PT to evaluate and treat ?-Discussed case including MRI with patient's neurosurgeon Dr. Christain Sacramento of [facility] who agreed with pain control.  Recommended patient to follow-up with him in the outpatient setting as she would likely need revision at some point in time. ?-4/11 will contact Dr. Christain Sacramento of [facility]: Does he want patient transferred directly to [facility] for surgery or would you like her to keep follow-up appointment on Thursday@1515  ?  ?Hypokalemia ?Acute.  Initial potassium 3.4. ?-Give  potassium chloride 20 mEq  ?-Resolved ? ?Generalized anxiety disorder ?-Klonopin 0.5 mg as needed for  anxiety ?-Trazodone 25 mg qhs ?  ? ? ?  ? ?Mobility Assessment (last 72 hours)   ? ? Mobility Assessment   ? ? Row Name 02/21/22 2030 02/21/22 1522 02/20/22 2104 02/20/22 1000 02/20/22 0956  ? Does patient have an order for bedrest or is patient medically unstable No - Continue assessment -- No - Continue assessment -- --  ? What is the highest level of mobility based on the  progressive mobility assessment? Level 4 (Walks with assist in room) - Balance while marching in place and cannot step forward and back - Complete Level 4 (Walks with assist in room) - Balance while marching in place and cannot step forward and back - Complete Level 1 (Bedfast) - Unable to balance while sitting on edge of bed Level 2 (Chairfast) - Balance while sitting on edge of bed and cannot stand Level 2 (Chairfast) - Balance while sitting on edge of bed and cannot stand  ? Is the above level different from baseline mobility prior to current illness? Yes - Recommend PT order -- -- -- --  ? ?  ?  ? ?  ? ? ?DVT prophylaxis: Lovenox ?Code Status: Full ?Family Communication:  ?Status is: Inpatient ? ? ? ?Dispo: The patient is from: Home ?             Anticipated d/c is to: Home ?             Anticipated d/c date is: 2 days ?             Patient currently is not medically stable to d/c. ? ? ? ? ? ?Consultants:  ?Dr. Grandville Silos Neurosurgeon [facility] ? ? ?Procedures/Significant Events:  ?4/10 MRI spine [facility]. [facility] contrast:Medium-sized central disc extrusion with inferior migration at ?L4-L5 causing narrowing of the right lateral recess with ?displacement of the right L5 nerve root. Correlate for right L5 ?radiculopathy. ? ? ? ?I have personally reviewed and interpreted all radiology studies and my findings are as above. ? ?VENTILATOR SETTINGS: ? ? ? ?Cultures ?4/10 influenza A/B negative ?4/10 SARS coronavirus negative ? ? ?Antimicrobials: ? ? ? ?Devices ?  ? ?LINES / TUBES:  ? ? ? ? ?Continuous Infusions: ? promethazine (PHENERGAN) injection (IM or IVPB) 12.5 mg (02/22/22 0048)  ? ? ? ?Objective: ?Vitals:  ? 02/21/22 1550 02/21/22 2018 02/22/22 0419 02/22/22 0720  ?BP: (!) 103/59 108/70 (!) 114/53 (!) 92/44  ?Pulse: 67 72 62 61  ?Resp: 18 18 18 16   ?Temp: 98.1 ?F (36.7 ?C) 98.6 ?F (37 ?C) 98.5 ?F (36.9 ?C) 98.3 ?F (36.8 ?C)  ?TempSrc: Oral Oral Oral Oral  ?SpO2: 99% 100% 98% 100%  ?Weight:      ?Height:       ? ? ?Intake/Output Summary (Last 24 hours) at 02/22/2022 0757 ?Last data filed at 02/21/2022 2030 ?Gross per 24 hour  ?Intake 700 ml  ?Output --  ?Net 700 ml  ? ? ?Filed Weights  ? 02/19/22 2158  ?Weight: 56.7 kg  ? ? ?Examination: ? ?General: A/O x4, No acute respiratory distress ?Eyes: negative scleral hemorrhage, negative anisocoria, negative icterus ?ENT: Negative Runny nose, negative gingival bleeding, ?Neck:  Negative scars, masses, torticollis, lymphadenopathy, JVD ?Lungs: Clear to auscultation bilaterally without wheezes or crackles ?Cardiovascular: Regular rate and rhythm without murmur gallop or rub normal S1 and S2 ?Abdomen: negative abdominal pain, nondistended, positive soft, bowel sounds, no rebound, no ascites, no appreciable mass ?Extremities: No significant cyanosis, clubbing, or edema bilateral  lower extremities ?Skin: Negative rashes, lesions, ulcers ?Psychiatric:  Negative depression, negative anxiety, negative fatigue, negative mania  ?Central nervous system:  Cranial nerves II through XII intact, tongue/uvula midline, all extremities muscle strength 5/5, except RLE strength 4/5, sensation intact throughout, except decreased sensation RLE negative dysarthria, negative expressive aphasia, negative receptive aphasia. ? ?.  ? ? ? ?Data Reviewed: Care during the described time interval was provided by me .  I have reviewed this patient's available data, including medical history, events of note, physical examination, and all test results as part of my evaluation. ? ?CBC: ?Recent Labs  ?Lab 02/20/22 ?0218 02/20/22 ?0232 02/21/22 ?[id number] 02/22/22 ?0134  ?WBC 9.9  --  9.7 9.4  ?NEUTROABS 6.6  --   --  6.6  ?HGB 13.8 13.6 12.6 12.6  ?HCT 40.1 40.0 37.9 37.7  ?MCV 89.9  --  90.0 90.4  ?PLT 199  --  174 177  ? ? ?Basic Metabolic Panel: ?Recent Labs  ?Lab 02/20/22 ?0232 02/21/22 ?[id number] 02/22/22 ?0134  ?NA 141 139 140  ?K 3.4* 3.7 3.6  ?CL 104 112* 108  ?CO2  --  22 27  ?GLUCOSE 126* 110* 116*  ?BUN 8 6 6    ?CREATININE 0.80 0.70 0.69  ?CALCIUM  --  8.7* 8.9  ?MG  --   --  2.1  ?PHOS  --   --  4.5  ? ? ?GFR: ?Estimated Creatinine Clearance: 90.4 mL/min (by C-G formula based on SCr of 0.69 mg/dL). ?Liver Function Tests: ?Recent

## 2022-02-22 NOTE — Discharge Summary (Signed)
Physician Discharge Summary  ?Tye Savoyaige M Leisure WUJ:[id number] DOB: 03/22/1991 DOA: 02/19/2022 ? ?PCP: Beatriz ChancellorPa, [facility] ? ?Admit date: 02/19/2022 ?Discharge date: 02/27/2022 ? ?Time spent: 35 minutes ? ?Recommendations for Outpatient Follow-up:  ? ?Radicular low back pain/L4-L5 disc extrusion ?Patient presents with complaints of acute lower back pain with radiation down her right leg.  MRI significant for medium size central disc extrusion with inferior migration at L4-5 causing narrowing of the right lateral recess with displacement of the right L5 nerve root.  Patient had been given  pain medication without improvement in pain symptoms.  She has prior history of L4-L5 microdissection with Dr. Christain SacramentoBirkedal in 04/2021. ?-Admit to a MedSurg bed ?-Oxycodone/fentanyl as needed for moderate to severe pain ?-Lidocaine patch ?-Continue prednisone ?-Lumbar corset while out of bed ?-PT to evaluate and treat ?-Discussed case including MRI with patient's neurosurgeon Dr. Christain [facility]Birkedal of [facility] who agreed with pain control.  Recommended patient to follow-up with him in the outpatient setting as she would likely need revision at some point in time. ?-4/12 discussed case with Dr. Christain [facility]edal of [facility]: He would like patient to  keep follow-up appointment on Thursday@1515 .  Patient and boyfriend were present during discussion over phone and agree with plan. ?  ?Hypokalemia ?Acute.  Initial potassium 3.4. ?-Give  potassium chloride 20 mEq  ?-Resolved ?  ?Generalized anxiety disorder ?-Klonopin 0.5 mg as needed for anxiety ?-Trazodone 25 mg qhs ? ?Discharge Diagnoses:  ?Principal Problem: ?  Radicular low back pain ?Active Problems: ?  Generalized anxiety disorder ?  Hypokalemia ?  Sciatica ? ? ?Discharge Condition: Stable ? ?Diet recommendation: Regular ? ?Filed Weights  ? 02/19/22 2158  ?Weight: 56.7 kg  ? ? ?History of present illness:  ?31 y.o. WF PMHx Anxiety, nephrolithiasis, nonepileptic events,  pelvic pain, and Chronic Back pain  ?  ?Presents with complaints of lower back pain last night.  She had been receiving a massage by her boyfriend who was sitting on the lower part of her buttocks and leg.  He had been rubbing her shoulders at the time and after he was done got up, but she reports that he never was standing on her back.  When she tried to push herself up felt a sharp stabbing pain radiating across her lower back and all the way down her right leg.  Pain was severe and she was unable to get up.  Pain across her lower back is constant at this time and any kind of movement worsen symptoms.  She is not able to lay on her right hip due to the pain.  Reported having episode where she felt numbness in the bilateral upper extremities as well as the bilateral lower extremities.  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 Christain SacramentoBirkedal of [facility] with [facility] in June 2022.  Reported trying to use a lidocaine patch without any relief.  She does report having some nausea related to pain medications given.  Denies any recent fever, saddle anesthesia, inability to void, or chest pain.  She does vape, admit to using marijuana, and occasional use of alcohol. ?  ?On admission to the emergency department patient was noted to have stable vital signs.  Labs significant for i-STAT potassium of 3.4.  Initial x-rays of the lumbar spine were negative.  Urinalysis did not note any acute abnormalities.  MRI of the lumbar spine noted medium sized central disc extrusion with  inferior migration at L4-L5 causing narrowing of the right lateral recess with displacement of the right L5 root.  She had received multiple rounds of pain medication, Flexeril, and prednisone without improvement in symptoms. ? ?Hospital Course:  ?See above ? ?Procedures: ?4/10 MRI spine W. Wo contrast:Medium-sized central disc extrusion with inferior migration at ?L4-L5  causing narrowing of the right lateral recess with ?displacement of the right L5 nerve root. Correlate for right L5 ?radiculopathy. ?  ? ?Consultations: ?Dr. Grandville Silos Neurosurgeon [facility] ? ? ? ?Discharge Exam: ?Vitals:  ? 02/21/22 2018 02/22/22 0419 02/22/22 0720 02/22/22 1550  ?BP: 108/70 (!) 114/53 (!) 92/44 98/72  ?Pulse: 72 62 61 96  ?Resp: 18 18 16 16   ?Temp: 98.6 ?F (37 ?C) 98.5 ?F (36.9 ?C) 98.3 ?F (36.8 ?C) 98.2 ?F (36.8 ?C)  ?TempSrc: Oral Oral Oral Oral  ?SpO2: 100% 98% 100% 94%  ?Weight:      ?Height:      ? ? ?General: A/O x4, No acute respiratory distress ?Eyes: negative scleral hemorrhage, negative anisocoria, negative icterus ?ENT: Negative Runny nose, negative gingival bleeding, ?Neck:  Negative scars, masses, torticollis, lymphadenopathy, JVD ?Central nervous system:  Cranial nerves II through XII intact, tongue/uvula midline, all extremities muscle strength 5/5, except RLE strength 4/5, sensation intact throughout, except decreased sensation RLE negative dysarthria, negative expressive aphasia, negative receptive aphasia. ? ? ? ?Discharge Instructions ? ? ?Allergies as of 02/22/2022   ? ?   Reactions  ? Hydrocodone Itching  ? Ketorolac Itching, Nausea And Vomiting  ? Morphine And Related Itching  ? Tramadol Itching, Nausea And Vomiting, Other (See Comments)  ? cramping  ? Venlafaxine Other (See Comments)  ? During taper of medication. Withdrawal symptoms  ? Betamethasone Swelling  ? Zofran [ondansetron] Itching  ? nightmares  ? Hydroxyzine Anxiety  ? The 25 mg tablets were fine, but when she took the 50 mg tablets at home she had adverse effects. Sweating  ? ?  ? ?  ?Medication List  ?  ? ?STOP taking these medications   ? ?Concept OB 130-92.4-1 MG Caps ?  ?DULoxetine 30 MG capsule ?Commonly known as: CYMBALTA ?  ?hydrOXYzine 25 MG tablet ?Commonly known as: ATARAX ?  ?hydrOXYzine 50 MG tablet ?Commonly known as: ATARAX ?  ?lamoTRIgine 25 MG tablet ?Commonly known as: LAMICTAL ?   ?metoCLOPramide 10 MG tablet ?Commonly known as: REGLAN ?  ? ?  ? ?TAKE these medications   ? ?albuterol 108 (90 Base) MCG/ACT inhaler ?Commonly known as: VENTOLIN HFA ?Inhale 2 puffs into the lungs daily as needed for shortness of breath. ?  ?clonazePAM 0.5 MG tablet ?Commonly known as: KLONOPIN ?Take 1 tablet (0.5 mg total) by mouth daily as needed (anxiety). ?What changed:  ?when to take this ?reasons to take this ?  ?cyclobenzaprine 5 MG tablet ?Commonly known as: FLEXERIL ?Take 1 tablet (5 mg total) by mouth 3 (three) times daily as needed for muscle spasms. ?  ?fluticasone 50 MCG/ACT nasal spray ?Commonly known as: FLONASE ?Place 1 spray into the nose daily as needed. ?  ?lidocaine 5 % ?Commonly known as: LIDODERM ?Place 1 patch onto the skin daily as needed (pain). Remove & Discard patch within 12 hours or as directed by MD ?  ?oxyCODONE-acetaminophen 5-325 MG tablet ?Commonly known as: Percocet ?Take 1 tablet by mouth every 6 (six) hours as needed for moderate pain or severe pain. ?  ?predniSONE 20 MG tablet ?Commonly known as: DELTASONE ?3 tabs po day  one, then 2 tabs daily x 4 days ?  ?promethazine 12.5 MG tablet ?Commonly known as: PHENERGAN ?Take 1 tablet (12.5 mg total) by mouth every 6 (six) hours as needed for nausea or vomiting. ?What changed: when to take this ?  ?REFRESH OP ?Place 1 drop into both eyes 3 (three) times daily as needed (dry eyes). ?  ?SLYND PO ?Take 1 tablet by mouth daily. Pt skips placebo pills and continues to next pack in order to skip period ?  ?traZODone 50 MG tablet ?Commonly known as: DESYREL ?Take 0.5 tablets (25 mg total) by mouth at bedtime as needed for sleep. ?  ? ?  ? ?Allergies  ?Allergen Reactions  ? Hydrocodone Itching  ? Ketorolac Itching and Nausea And Vomiting  ? Morphine And Related Itching  ? Tramadol Itching, Nausea And Vomiting and Other (See Comments)  ?  cramping  ? Venlafaxine Other (See Comments)  ?  During taper of medication. Withdrawal symptoms ? ?  ?  Betamethasone Swelling  ? Zofran [Ondansetron] Itching  ?  nightmares  ? Hydroxyzine Anxiety  ?  The 25 mg tablets were fine, but when she took the 50 mg tablets at home she had adverse effects. Sweating  ?

## 2022-02-22 NOTE — Progress Notes (Signed)
Abigail Harding to be D/C'd  per MD order.  Discussed with the patient and all questions fully answered. ? ?VSS, Skin clean, dry and intact without evidence of skin break down, no evidence of skin tears noted. ? ?IV catheter discontinued intact. Site without signs and symptoms of complications. Dressing and pressure applied. ? ?An After Visit Summary was printed and given to the patient.  ? ?D/c education completed with patient/family including follow up instructions, medication list, d/c activities limitations if indicated, with other d/c instructions as indicated by MD - patient able to verbalize understanding, all questions fully answered.  ? ?Patient instructed to return to ED, call 911, or call MD for any changes in condition.  ? ?Patient to be escorted via WC, and D/C home via private auto.  ?

## 2022-09-30 ENCOUNTER — Emergency Department (HOSPITAL_COMMUNITY): Payer: Medicaid Other

## 2022-09-30 ENCOUNTER — Other Ambulatory Visit: Payer: Self-pay

## 2022-09-30 ENCOUNTER — Emergency Department (HOSPITAL_COMMUNITY)
Admission: EM | Admit: 2022-09-30 | Discharge: 2022-09-30 | Disposition: A | Discharge status: Home / Self Care | Payer: Medicaid Other | Attending: Emergency Medicine | Admitting: Emergency Medicine

## 2022-09-30 DIAGNOSIS — R109 Unspecified abdominal pain: Secondary | ICD-10-CM

## 2022-09-30 DIAGNOSIS — J45909 Unspecified asthma, uncomplicated: Secondary | ICD-10-CM | POA: Diagnosis not present

## 2022-09-30 DIAGNOSIS — R197 Diarrhea, unspecified: Secondary | ICD-10-CM | POA: Insufficient documentation

## 2022-09-30 DIAGNOSIS — N179 Acute kidney failure, unspecified: Secondary | ICD-10-CM | POA: Diagnosis not present

## 2022-09-30 DIAGNOSIS — R112 Nausea with vomiting, unspecified: Secondary | ICD-10-CM

## 2022-09-30 DIAGNOSIS — R1110 Vomiting, unspecified: Secondary | ICD-10-CM | POA: Diagnosis not present

## 2022-09-30 DIAGNOSIS — R000 Tachycardia, unspecified: Secondary | ICD-10-CM | POA: Diagnosis not present

## 2022-09-30 DIAGNOSIS — R1011 Right upper quadrant pain: Secondary | ICD-10-CM

## 2022-09-30 DIAGNOSIS — D72829 Elevated white blood cell count, unspecified: Secondary | ICD-10-CM | POA: Diagnosis not present

## 2022-09-30 LAB — COMPREHENSIVE METABOLIC PANEL
ALT: 9 U/L (ref 0–44)
AST: 22 U/L (ref 15–41)
Albumin: 4.6 g/dL (ref 3.5–5.0)
Alkaline Phosphatase: 39 U/L (ref 38–126)
Anion gap: 16 — ABNORMAL HIGH (ref 5–15)
BUN: 15 mg/dL (ref 6–20)
CO2: 18 mmol/L — ABNORMAL LOW (ref 22–32)
Calcium: 9.2 mg/dL (ref 8.9–10.3)
Chloride: 105 mmol/L (ref 98–111)
Creatinine, Ser: 1.12 mg/dL — ABNORMAL HIGH (ref 0.44–1.00)
GFR, Estimated: >60 mL/min (ref >60)
Glucose, Bld: 117 mg/dL — ABNORMAL HIGH (ref 70–99)
Potassium: 3.8 mmol/L (ref 3.5–5.1)
Sodium: 139 mmol/L (ref 135–145)
Total Bilirubin: 1.1 mg/dL (ref 0.3–1.2)
Total Protein: 7.2 g/dL (ref 6.5–8.1)

## 2022-09-30 LAB — URINALYSIS, ROUTINE W REFLEX MICROSCOPIC
Bilirubin Urine: NEGATIVE
Glucose, UA: NEGATIVE mg/dL
Hgb urine dipstick: NEGATIVE
Ketones, ur: 80 mg/dL — AB
Leukocytes,Ua: NEGATIVE
Nitrite: NEGATIVE
Protein, ur: NEGATIVE mg/dL
Specific Gravity, Urine: 1.025 (ref 1.005–1.030)
pH: 5 (ref 5.0–8.0)

## 2022-09-30 LAB — CBC WITH DIFFERENTIAL/PLATELET
Abs Immature Granulocytes: 0.04 10*3/uL (ref 0.00–0.07)
Basophils Absolute: 0 10*3/uL (ref 0.0–0.1)
Basophils Relative: 0 %
Eosinophils Absolute: 0 10*3/uL (ref 0.0–0.5)
Eosinophils Relative: 0 %
HCT: 42.1 % (ref 36.0–46.0)
Hemoglobin: 14.4 g/dL (ref 12.0–15.0)
Immature Granulocytes: 0 %
Lymphocytes Relative: 10 %
Lymphs Abs: 1.1 10*3/uL (ref 0.7–4.0)
MCH: 30.7 pg (ref 26.0–34.0)
MCHC: 34.2 g/dL (ref 30.0–36.0)
MCV: 89.8 fL (ref 80.0–100.0)
Monocytes Absolute: 0.5 10*3/uL (ref 0.1–1.0)
Monocytes Relative: 4 %
Neutro Abs: 9.2 10*3/uL — ABNORMAL HIGH (ref 1.7–7.7)
Neutrophils Relative %: 86 %
Platelets: 210 10*3/uL (ref 150–400)
RBC: 4.69 MIL/uL (ref 3.87–5.11)
RDW: 11.4 % — ABNORMAL LOW (ref 11.5–15.5)
WBC: 10.8 10*3/uL — ABNORMAL HIGH (ref 4.0–10.5)
nRBC: 0 % (ref 0.0–0.2)

## 2022-09-30 LAB — LIPASE, BLOOD: Lipase: 29 U/L (ref 11–51)

## 2022-09-30 LAB — I-STAT BETA HCG BLOOD, ED (MC, WL, AP ONLY): I-stat hCG, quantitative: <5 m[IU]/mL (ref <5)

## 2022-09-30 MED ORDER — OXYCODONE-ACETAMINOPHEN 5-325 MG PO TABS: 1.0000 | ORAL_TABLET | Freq: Four times a day (QID) | ORAL | 0 refills | Status: DC | PRN

## 2022-09-30 MED ORDER — PROMETHAZINE HCL 12.5 MG PO TABS: 12.5000 mg | ORAL_TABLET | Freq: Four times a day (QID) | ORAL | 0 refills | Status: DC | PRN

## 2022-09-30 MED ORDER — SLYND 4 MG PO TABS: 1.0000 | ORAL_TABLET | Freq: Every day | ORAL | 3 refills | Status: AC

## 2022-09-30 MED ORDER — METOCLOPRAMIDE HCL 5 MG/ML IJ SOLN
10.0000 mg | Freq: Once | INTRAMUSCULAR | Status: AC
  Administered 2022-09-30: 10 mg via INTRAVENOUS
  Filled 2022-09-30: qty 2

## 2022-09-30 MED ORDER — FENTANYL CITRATE PF 50 MCG/ML IJ SOSY
50.0000 ug | PREFILLED_SYRINGE | Freq: Once | INTRAMUSCULAR | Status: AC
  Administered 2022-09-30: 50 ug via INTRAVENOUS
  Filled 2022-09-30: qty 1

## 2022-09-30 MED ORDER — LORAZEPAM 2 MG/ML IJ SOLN
1.0000 mg | Freq: Once | INTRAMUSCULAR | Status: AC
  Administered 2022-09-30: 1 mg via INTRAVENOUS
  Filled 2022-09-30: qty 1

## 2022-09-30 NOTE — ED Provider Notes (Signed)
[facility] EMERGENCY DEPARTMENT Provider Note   CSN: [id number] Arrival date & time: 09/30/22  1448     History  No chief complaint on file.   Abigail Harding is a 31 y.o. female.  Pt is a 31y/o female with hx of asthma, endometriosis, anxiety, chronic back pain and kidney stones presenting with abd pain and vomiting since ysterday.  She sat up in bed yesterday and noticed a pain to the right side of the upper abdomen.  She reports laying back down but getting up again it still hurt.  She then shortly after there started having vomiting.  She reports all day yesterday she had the pain with intermittent vomiting and 5 episodes of diarrhea.  She did follow-up with her PCP by the end of the day and they checked a urine which was normal but gave her precautions and were planning on following up with her in a few days.  She reports the pain was persistent all night long with the vomiting and 2 further episodes of diarrhea and she could not handle it any longer and came in this morning.  She has not had any dysuria, frequency or urgency.  She has not seen any hematuria.  She reports yesterday the pain in her back was severe but is more in the right upper quadrant today.  In the past she has had a HIDA scan which was normal has had multiple surgeries for endometriosis and ovarian cysts as well as an appendectomy.  No history of IBS.  Last menses was 2 years ago because she takes birth control constantly.  He denies any fever, chest pain or shortness of breath.  The history is provided by medical records and the patient.       Home Medications Prior to Admission medications   Medication Sig Start Date End Date Taking? Authorizing Provider  oxyCODONE-acetaminophen (PERCOCET/ROXICET) 5-325 MG tablet Take 1 tablet by mouth every 6 (six) hours as needed for severe pain. 09/30/22  Yes Gwyneth Sprout, MD  promethazine (PHENERGAN) 12.5 MG tablet Take 1 tablet (12.5 mg total) by  mouth every 6 (six) hours as needed for nausea or vomiting. 09/30/22  Yes Pearly Apachito, Alphonzo Lemmings, MD  albuterol (VENTOLIN HFA) 108 (90 Base) MCG/ACT inhaler Inhale 2 puffs into the lungs daily as needed for shortness of breath. 05/15/21   [provider]  clonazePAM (KLONOPIN) 0.5 MG tablet Take 1 tablet (0.5 mg total) by mouth daily as needed (anxiety). 02/22/22   Drema Dallas, MD  cyclobenzaprine (FLEXERIL) 5 MG tablet Take 1 tablet (5 mg total) by mouth 3 (three) times daily as needed for muscle spasms. Patient taking differently: Take 5 mg by mouth daily as needed for muscle spasms. 02/20/22   Fayrene Helper, PA-C  Drospirenone (SLYND) 4 MG TABS Take 1 tablet by mouth daily. Pt skips placebo pills and continues to next pack in order to skip period 09/30/22   Gwyneth Sprout, MD  fluticasone (FLONASE) 50 MCG/ACT nasal spray Place 1 spray into the nose daily as needed.    [provider]  lidocaine (LIDODERM) 5 % Place 1 patch onto the skin daily as needed (pain). Remove & Discard patch within 12 hours or as directed by MD    [provider]  Polyvinyl Alcohol-Povidone (REFRESH OP) Place 1 drop into both eyes 3 (three) times daily as needed (dry eyes).    [provider]  predniSONE (DELTASONE) 20 MG tablet 3 tabs po day one, then 2 tabs  daily x 4 days 02/20/22   Fayrene Helper, PA-C  traZODone (DESYREL) 50 MG tablet Take 0.5 tablets (25 mg total) by mouth at bedtime as needed for sleep. 02/22/22   Drema Dallas, MD      Allergies    Hydrocodone, Ketorolac, Morphine and related, Tramadol, Venlafaxine, Betamethasone, Zofran [ondansetron], and Hydroxyzine    Review of Systems   Review of Systems  Physical Exam Updated Vital Signs BP (!) 101/46   Pulse (!) 58   Temp 97.9 F (36.6 C) (Oral)   Resp 14   SpO2 96%  Physical Exam Vitals and nursing note reviewed.  Constitutional:      General: She is in acute distress.     Appearance: She is well-developed.      Comments: Appears uncomfortable  HENT:     Head: Normocephalic and atraumatic.  Eyes:     Pupils: Pupils are equal, round, and reactive to light.  Cardiovascular:     Rate and Rhythm: Regular rhythm. Tachycardia present.     Heart sounds: Normal heart sounds. No murmur heard.    No friction rub.  Pulmonary:     Effort: Pulmonary effort is normal.     Breath sounds: Normal breath sounds. No wheezing or rales.  Abdominal:     General: Bowel sounds are normal. There is no distension.     Palpations: Abdomen is soft.     Tenderness: There is abdominal tenderness in the right upper quadrant. There is right CVA tenderness and guarding. There is no rebound.  Musculoskeletal:        General: No tenderness. Normal range of motion.     Comments: No edema  Skin:    General: Skin is warm and dry.     Findings: No rash.  Neurological:     Mental Status: She is alert and oriented to person, place, and time.     Cranial Nerves: No cranial nerve deficit.  Psychiatric:        Behavior: Behavior normal.     ED Results / Procedures / Treatments   Labs (all labs ordered are listed, but only abnormal results are displayed) Labs Reviewed  CBC WITH DIFFERENTIAL/PLATELET - Abnormal; Notable for the following components:      Result Value   WBC 10.8 (*)    RDW 11.4 (*)    Neutro Abs 9.2 (*)    All other components within normal limits  COMPREHENSIVE METABOLIC PANEL - Abnormal; Notable for the following components:   CO2 18 (*)    Glucose, Bld 117 (*)    Creatinine, Ser 1.12 (*)    Anion gap 16 (*)    All other components within normal limits  LIPASE, BLOOD  URINALYSIS, ROUTINE W REFLEX MICROSCOPIC  I-STAT BETA HCG BLOOD, ED ([facility], [facility], [facility] ONLY)    EKG None  Radiology US Abdomen Limited RUQ (LIVER/GB)  Result Date: 09/30/2022 CLINICAL DATA:  Right upper quadrant pain EXAM: ULTRASOUND ABDOMEN LIMITED RIGHT UPPER QUADRANT COMPARISON:  None Available. FINDINGS: Gallbladder: A positive  Murphy's sign was reported. No wall thickening, stone, or pericholecystic fluid. Common bile duct: Diameter: 2.4 mm Liver: No focal lesion identified. Within normal limits in parenchymal echogenicity. Portal vein is patent on color Doppler imaging with normal direction of blood flow towards the liver. Other: None. IMPRESSION: 1. A positive Murphy's sign was reported. The gallbladder is otherwise normal in appearance. If the clinical picture remains ambiguous, recommend HIDA scan. 2. The common bile duct and liver are normal.  Electronically Signed   By: Gerome Sam III M.D.   On: 09/30/2022 13:38   CT Renal Stone Study  Result Date: 09/30/2022 CLINICAL DATA:  Right upper quadrant pain/flank pain. EXAM: CT ABDOMEN AND PELVIS WITHOUT CONTRAST TECHNIQUE: Multidetector CT imaging of the abdomen and pelvis was performed following the standard protocol without IV contrast. RADIATION DOSE REDUCTION: This exam was performed according to the departmental dose-optimization program which includes automated exposure control, adjustment of the mA and/or kV according to patient size and/or use of iterative reconstruction technique. COMPARISON:  None Available. FINDINGS: Lower chest: No acute abnormality. Hepatobiliary: Liver has a normal contour. No perihepatic fluid is visualized. No focal liver lesions. Note that assessment of the liver parenchyma is limited in the absence of IV contrast. The gallbladder is normal in appearance without evidence of cholelithiasis or cholecystitis. Pancreas: There is very mild peripancreatic fat stranding. Assessment of the pancreas is limited in the absence of IV contrast. No focal pancreatic lesion or pancreatic ductal dilatation is visualized. Spleen: The spleen is normal in size. No evidence of perisplenic fluid. There is a small splenule adjacent to the spleen. Adrenals/Urinary Tract: Bilateral adrenal glands are normal in size without focal lesions visualized. Bilateral kidneys are  normal in size. There is small punctate renal stone in the lower pole of the right kidney (series 6, image 66). There is no evidence of hydronephrosis. Left kidney is normal in appearance. The urinary bladder is fluid-filled without focal wall thickening. Stomach/Bowel: Stomach, small bowel, large bowel are normal in caliber. There is no evidence of bowel obstruction. The appendix is not visualized and may be surgically absent. Correlate with history. Vascular/Lymphatic: No significant vascular findings are present. No enlarged abdominal or pelvic lymph nodes. Reproductive: The uterus and left ovary are normal in appearance. The right ovary is poorly visualized. Other: No abdominal wall hernia or abnormality. No abdominopelvic ascites. Musculoskeletal: No acute or significant osseous findings. IMPRESSION: 1. Punctate nonobstructive right sided renal stone. 2. There is very mild peripancreatic fat stranding. Recommend correlation with lipase levels to exclude the possibility of pancreatitis. 3. The appendix is not visualized and may be surgically absent. Correlate with history. Electronically Signed   By: Lorenza Cambridge M.D.   On: 09/30/2022 11:39    Procedures Procedures    Medications Ordered in ED Medications  LORazepam (ATIVAN) injection 1 mg (1 mg Intravenous Given 09/30/22 0943)  fentaNYL (SUBLIMAZE) injection 50 mcg (50 mcg Intravenous Given 09/30/22 1043)  metoCLOPramide (REGLAN) injection 10 mg (10 mg Intravenous Given 09/30/22 1408)  fentaNYL (SUBLIMAZE) injection 50 mcg (50 mcg Intravenous Given 09/30/22 1426)    ED Course/ Medical Decision Making/ A&P                           Medical Decision Making Amount and/or Complexity of Data Reviewed Labs: ordered. Decision-making details documented in ED Course. Radiology: ordered and independent interpretation performed. Decision-making details documented in ED Course.  Risk Prescription drug management.   Pt with multiple medical  problems and comorbidities and presenting today with a complaint that caries a high risk for morbidity and mortality.]  Here today with persistent vomiting, diarrhea and right upper quadrant pain.  Patient is hemodynamically stable at this time but is very anxious and uncomfortable upon arrival.  She had received Zofran without improvement of her symptoms so was given a dose of Ativan with some improvement.  She was also given pain control.  Concern for hepatitis,  pancreatitis, cholecystitis, kidney stones.  Lower suspicion for bowel obstruction or perforation. I independently interpreted patient's labs today and CBC with a leukocytosis of 10.8 and normal hemoglobin, CMP with mild AKI with creatinine of 1.12, anion gap of 16 but normal electrolytes, LFTs, lipase within normal limits and hCG is negative.  Possibility for gallbladder pathology however based on lab work unlikely to be hepatitis or pancreatitis.  Also concerned that she may have kidney stones.  We will start with renal stone study and if negative will do gallbladder ultrasound.  I have independently visualized and interpreted pt's images today.  Gallbladder ultrasound without evidence of common bile duct dilatation or gallstones.  Radiology reports a positive Murphy sign's but no other acute findings.  CT renal shows a right-sided nonobstructive stone but no other acute findings.  UA still pending.  3:25 PM Patient is feeling better after her pain control.  Discussed the findings with her.  She is going to follow-up with her outpatient doctors.  We will confirm no signs of infection in her urine.        Final Clinical Impression(s) / ED Diagnoses Final diagnoses:  Right flank pain  RUQ pain  Nausea and vomiting, unspecified vomiting type    Rx / DC Orders ED Discharge Orders          Ordered    Drospirenone (SLYND) 4 MG TABS  Daily        09/30/22 1446    oxyCODONE-acetaminophen (PERCOCET/ROXICET) 5-325 MG tablet  Every 6  hours PRN        09/30/22 1525    promethazine (PHENERGAN) 12.5 MG tablet  Every 6 hours PRN        09/30/22 1525              Gwyneth SproutPlunkett, Autum Benfer, MD 09/30/22 1526

## 2022-09-30 NOTE — ED Triage Notes (Signed)
Pt is BIB by [facility]. Pt is coming from home, having episodes of nausea and vomitting. Pt is having right upper quadrant pain that radiates to her mid back. Pt received 4mg  of zofran, and 500 bolus of NS.   LVS  146/84  HR 103 SATs 98%  CBG 152 Pt has a 20g in LAC   Pt states that she had surgery for a herniated disk a few months back. Pt is crying and very anxious.

## 2022-09-30 NOTE — ED Notes (Signed)
Pt has a hx of kidney stones and had her appendix removed. Pt is actively vomiting.

## 2022-09-30 NOTE — Discharge Instructions (Addendum)
Your gallbladder looks normal today.  You do have a very small stone in your kidney but does not appear to be causing any blockage at this time.  Please follow-up with your primary doctor.  Any symptoms change or worsen acutely, please turn to the management department.  Use the pain and nausea medicine to help maintain hydration and help with the discomfort.

## 2022-09-30 NOTE — ED Notes (Signed)
Ultrasound at bedside

## 2022-09-30 NOTE — ED Provider Notes (Signed)
3:49 PM Care assumed from Dr. Anitra Lauth.  At time of transfer of care, patient is waiting for results of urinalysis to rule out UTI as a contributor to her nausea vomiting, abdominal discomfort.  Patient's ultrasound did not show convincing evidence of acute cholecystitis and the CT scan also did not show convincing evidence of appendicitis or active obstructing kidney stone.  Did show some possible pancreatic stranding however lipase was normal.  The ultrasound did say that although there was no findings of acute cholecystitis there was a Eulah Pont sign so HIDA could be considered.  Previous team planned on discharging patient if she was feeling better and urinalysis was reassuring.  Anticipate discharge for outpatient follow-up and strict return precautions.  4:57 PM Urinalysis did not show infection.  We went through all the findings together on the work-up and patient agrees with discharge home at this time.  We discussed that if symptoms worsen she needs to return for reassessment and increase her hydration.  She is feeling better after medications.  She agrees with plan for discharge with nausea medicine and pain medicine and will follow-up with PCP and her urologist that she wants to see.  She had no other questions or concerns and was discharged in stable condition.   Clinical Impression: 1. Right flank pain   2. RUQ pain   3. Nausea and vomiting, unspecified vomiting type     Disposition: Discharge  Condition: Good  I have discussed the results, Dx and Tx plan with the pt(& family if present). He/she/they expressed understanding and agree(s) with the plan. Discharge instructions discussed at great length. Strict return precautions discussed and pt &/or family have verbalized understanding of the instructions. No further questions at time of discharge.    New Prescriptions   OXYCODONE-ACETAMINOPHEN (PERCOCET/ROXICET) 5-325 MG TABLET    Take 1 tablet by mouth every 6 (six) hours as needed  for severe pain.   PROMETHAZINE (PHENERGAN) 12.5 MG TABLET    Take 1 tablet (12.5 mg total) by mouth every 6 (six) hours as needed for nausea or vomiting.    Follow Up:  follow up with your doctor and urologist    Pa, [facility] [address] [place] [address] [phone]        Quindarius Cabello, Canary Brim, MD 09/30/22 [phone]

## 2022-12-19 ENCOUNTER — Emergency Department (HOSPITAL_COMMUNITY)
Admission: EM | Admit: 2022-12-19 | Discharge: 2022-12-19 | Disposition: A | Discharge status: Home / Self Care | Payer: Medicaid Other | Attending: Emergency Medicine | Admitting: Emergency Medicine

## 2022-12-19 ENCOUNTER — Other Ambulatory Visit: Payer: Self-pay

## 2022-12-19 ENCOUNTER — Encounter (HOSPITAL_COMMUNITY): Payer: Self-pay

## 2022-12-19 ENCOUNTER — Emergency Department (HOSPITAL_COMMUNITY): Payer: Medicaid Other

## 2022-12-19 DIAGNOSIS — R112 Nausea with vomiting, unspecified: Secondary | ICD-10-CM | POA: Insufficient documentation

## 2022-12-19 DIAGNOSIS — R1011 Right upper quadrant pain: Secondary | ICD-10-CM | POA: Diagnosis present

## 2022-12-19 DIAGNOSIS — F419 Anxiety disorder, unspecified: Secondary | ICD-10-CM | POA: Insufficient documentation

## 2022-12-19 DIAGNOSIS — R718 Other abnormality of red blood cells: Secondary | ICD-10-CM | POA: Diagnosis not present

## 2022-12-19 DIAGNOSIS — D72829 Elevated white blood cell count, unspecified: Secondary | ICD-10-CM | POA: Insufficient documentation

## 2022-12-19 DIAGNOSIS — R000 Tachycardia, unspecified: Secondary | ICD-10-CM | POA: Insufficient documentation

## 2022-12-19 LAB — COMPREHENSIVE METABOLIC PANEL
ALT: 11 U/L (ref 0–44)
AST: 21 U/L (ref 15–41)
Albumin: 4.8 g/dL (ref 3.5–5.0)
Alkaline Phosphatase: 46 U/L (ref 38–126)
Anion gap: 9 (ref 5–15)
BUN: 13 mg/dL (ref 6–20)
CO2: 23 mmol/L (ref 22–32)
Calcium: 9.2 mg/dL (ref 8.9–10.3)
Chloride: 105 mmol/L (ref 98–111)
Creatinine, Ser: 0.9 mg/dL (ref 0.44–1.00)
GFR, Estimated: >60 mL/min (ref >60)
Glucose, Bld: 109 mg/dL — ABNORMAL HIGH (ref 70–99)
Potassium: 3.5 mmol/L (ref 3.5–5.1)
Sodium: 137 mmol/L (ref 135–145)
Total Bilirubin: 0.8 mg/dL (ref 0.3–1.2)
Total Protein: 7.8 g/dL (ref 6.5–8.1)

## 2022-12-19 LAB — CBC WITH DIFFERENTIAL/PLATELET
Abs Immature Granulocytes: 0.05 10*3/uL (ref 0.00–0.07)
Basophils Absolute: 0 10*3/uL (ref 0.0–0.1)
Basophils Relative: 0 %
Eosinophils Absolute: 0.1 10*3/uL (ref 0.0–0.5)
Eosinophils Relative: 1 %
HCT: 48.5 % — ABNORMAL HIGH (ref 36.0–46.0)
Hemoglobin: 15.8 g/dL — ABNORMAL HIGH (ref 12.0–15.0)
Immature Granulocytes: 0 %
Lymphocytes Relative: 5 %
Lymphs Abs: 0.8 10*3/uL (ref 0.7–4.0)
MCH: 29.7 pg (ref 26.0–34.0)
MCHC: 32.6 g/dL (ref 30.0–36.0)
MCV: 91.2 fL (ref 80.0–100.0)
Monocytes Absolute: 1.1 10*3/uL — ABNORMAL HIGH (ref 0.1–1.0)
Monocytes Relative: 7 %
Neutro Abs: 15 10*3/uL — ABNORMAL HIGH (ref 1.7–7.7)
Neutrophils Relative %: 87 %
Platelets: 246 10*3/uL (ref 150–400)
RBC: 5.32 MIL/uL — ABNORMAL HIGH (ref 3.87–5.11)
RDW: 11.6 % (ref 11.5–15.5)
WBC: 17.1 10*3/uL — ABNORMAL HIGH (ref 4.0–10.5)
nRBC: 0 % (ref 0.0–0.2)

## 2022-12-19 LAB — URINALYSIS, ROUTINE W REFLEX MICROSCOPIC
Bilirubin Urine: NEGATIVE
Glucose, UA: NEGATIVE mg/dL
Hgb urine dipstick: NEGATIVE
Ketones, ur: NEGATIVE mg/dL
Leukocytes,Ua: NEGATIVE
Nitrite: NEGATIVE
Protein, ur: NEGATIVE mg/dL
Specific Gravity, Urine: 1.018 (ref 1.005–1.030)
pH: 6 (ref 5.0–8.0)

## 2022-12-19 LAB — I-STAT BETA HCG BLOOD, ED (MC, WL, AP ONLY): I-stat hCG, quantitative: <5 m[IU]/mL (ref <5)

## 2022-12-19 LAB — LIPASE, BLOOD: Lipase: 39 U/L (ref 11–51)

## 2022-12-19 MED ORDER — LORAZEPAM 2 MG/ML IJ SOLN
1.0000 mg | Freq: Once | INTRAMUSCULAR | Status: AC
  Administered 2022-12-19: 1 mg via INTRAVENOUS
  Filled 2022-12-19: qty 1

## 2022-12-19 MED ORDER — FENTANYL CITRATE PF 50 MCG/ML IJ SOSY
50.0000 ug | PREFILLED_SYRINGE | Freq: Once | INTRAMUSCULAR | Status: AC
  Administered 2022-12-19: 50 ug via INTRAVENOUS
  Filled 2022-12-19: qty 1

## 2022-12-19 MED ORDER — PROCHLORPERAZINE EDISYLATE 10 MG/2ML IJ SOLN
10.0000 mg | Freq: Once | INTRAMUSCULAR | Status: AC
  Administered 2022-12-19: 10 mg via INTRAVENOUS
  Filled 2022-12-19: qty 2

## 2022-12-19 MED ORDER — DIPHENHYDRAMINE HCL 50 MG/ML IJ SOLN
12.5000 mg | Freq: Once | INTRAMUSCULAR | Status: AC
  Administered 2022-12-19: 12.5 mg via INTRAVENOUS
  Filled 2022-12-19: qty 1

## 2022-12-19 MED ORDER — PANTOPRAZOLE SODIUM 40 MG PO TBEC: 40.0000 mg | DELAYED_RELEASE_TABLET | Freq: Every day | ORAL | 0 refills | Status: DC

## 2022-12-19 MED ORDER — LACTATED RINGERS IV BOLUS
1000.0000 mL | Freq: Once | INTRAVENOUS | Status: AC
  Administered 2022-12-19: 1000 mL via INTRAVENOUS

## 2022-12-19 MED ORDER — HALOPERIDOL LACTATE 5 MG/ML IJ SOLN
4.0000 mg | Freq: Once | INTRAMUSCULAR | Status: AC
  Administered 2022-12-19: 4 mg via INTRAVENOUS
  Filled 2022-12-19: qty 1

## 2022-12-19 MED ORDER — METOCLOPRAMIDE HCL 10 MG PO TABS: 10.0000 mg | ORAL_TABLET | Freq: Four times a day (QID) | ORAL | 0 refills | Status: DC | PRN

## 2022-12-19 MED ORDER — LORAZEPAM 1 MG PO TABS: 1.0000 mg | ORAL_TABLET | Freq: Once | ORAL | Status: DC

## 2022-12-19 NOTE — ED Notes (Signed)
Patient transported to CT 

## 2022-12-19 NOTE — ED Triage Notes (Signed)
Pt BIB [facility] from home c/o abdominal pain N/V/D that started this morning. Pt has also been having right flank pain. 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 Pt is allergic to Zofran.

## 2022-12-19 NOTE — ED Notes (Signed)
Patient to US at this time.

## 2022-12-19 NOTE — ED Provider Notes (Signed)
  Physical Exam  BP (!) 104/54   Pulse 82   Temp 99.3 F (37.4 C)   Resp 12   Ht 5\' 4"  (1.626 m)   Wt 56.7 kg   SpO2 97%   BMI 21.46 kg/m   Physical Exam  Procedures  Procedures  ED Course / MDM    Medical Decision Making Amount and/or Complexity of Data Reviewed Labs: ordered. Radiology: ordered.  Risk Prescription drug management.   Received care of patient from Dr. Doren Custard. Please see his note for prior history, physical and care. Briefly this is a 32yo female who presented with nausea, vomiting and abdominal pain.  [place] pending.  Labs completed and personally evaluated by me without acute abnormalities, no pancreatitis, hepatitis, significant electrolyte abnormalities.  Pregnancy test negative. UA without infection. Does have leukocytosis which is nonspecific. Denies vaginal discharge and pain primarily RUQ/flank and epigastric and doubt PID.  [place] pelivc negative for torsion, no stones or GB wall abnormalities.    CT stone study with no obstructing stones or other abnormalities.  Feels improved after haldol.  Possible gastroenteritis, cannabinoid hyperemesis, or GB dysfunction.  Discussed given prior episode of RUQ pain could consider other outpatient GB functional studies but do not see signs of cholecystitis at this time.  Discussed marijuana cessation and cannabinoid hyperemesis. Given rx for nausea medicine and PPI. Patient discharged in stable condition with understanding of reasons to return.        Gareth Morgan, MD 12/20/22 1340

## 2022-12-19 NOTE — ED Notes (Signed)
Patient back from ultrasound at this time.

## 2022-12-19 NOTE — ED Provider Triage Note (Signed)
Emergency Medicine Provider Triage Evaluation Note  Abigail Harding , a 32 y.o. female  was evaluated in triage.  Pt complains of abdominal pain.  Review of Systems  Positive: Abdominal pain, nausea, vomiting Negative: Back pain, dysuria, vaginal bleeding, headache  Physical Exam  BP (!) 150/112 (BP Location: Right Arm)   Pulse (!) 149   Temp 98.3 F (36.8 C) (Oral)   Resp (!) 26   Ht 5\' 4"  (1.626 m)   Wt 56.7 kg   SpO2 99%   BMI 21.46 kg/m  Gen:   Awake, distressed Resp:  Tachypneic, CTAB MSK:   Moves extremities without difficulty  Other:  Tremulous and tachycardic; right upper quadrant abdominal pain with tenderness.  Medical Decision Making  Medically screening exam initiated at 2:25 PM.  Appropriate orders placed.  JAMILETH PUTZIER was informed that the remainder of the evaluation will be completed by another provider, this initial triage assessment does not replace that evaluation, and the importance of remaining in the ED until their evaluation is complete.  Patient presenting for right upper quadrant abdominal pain, nausea, and vomiting starting today.  She has not eaten anything.  She has tried to drink water.  She tried her home Phenergan for her nausea threw up.  Pain has been severe.  She has a history of endometriosis and is on birth control for this.  She does not get menstrual periods.  She arrives via EMS.  No Zofran was given due to allergy history.  She is tachycardic, tremulous, and anxious on exam.   Godfrey Pick, MD 12/19/22 1428

## 2022-12-19 NOTE — ED Provider Notes (Signed)
[facility] Provider Note   CSN: [id number] Arrival date & time: 12/19/22  1409     History  Chief Complaint  Patient presents with   Abdominal Pain    Abigail Harding is a 32 y.o. female.   Abdominal Pain Associated symptoms: nausea and vomiting   Patient presenting for abdominal pain, nausea, vomiting.  Her medical history includes ADHD, anemia, anxiety, endometriosis, nephrolithiasis, ovarian cyst, PID, seizures.  Onset was this morning.  It was not postprandial.  Pain has been located in right upper quadrant.  She tried taking her home Phenergan but threw up.  She has had persistent vomiting throughout the day.  She arrives via EMS.  EMS did not provide any medical interventions prior to arrival.  She does endorse history of allergy to Zofran.  Currently, pain is severe.     Home Medications Prior to Admission medications   Medication Sig Start Date End Date Taking? Authorizing Provider  albuterol (VENTOLIN HFA) 108 (90 Base) MCG/ACT inhaler Inhale 2 puffs into the lungs daily as needed for shortness of breath. 05/15/21   [provider]  clonazePAM (KLONOPIN) 0.5 MG tablet Take 1 tablet (0.5 mg total) by mouth daily as needed (anxiety). 02/22/22   Allie Bossier, MD  cyclobenzaprine (FLEXERIL) 5 MG tablet Take 1 tablet (5 mg total) by mouth 3 (three) times daily as needed for muscle spasms. Patient taking differently: Take 5 mg by mouth daily as needed for muscle spasms. 02/20/22   Domenic Moras, PA-C  Drospirenone (SLYND) 4 MG TABS Take 1 tablet by mouth daily. Pt skips placebo pills and continues to next pack in order to skip period 09/30/22   Blanchie Dessert, MD  fluticasone (FLONASE) 50 MCG/ACT nasal spray Place 1 spray into the nose daily as needed.    [provider]  lidocaine (LIDODERM) 5 % Place 1 patch onto the skin daily as needed (pain). Remove & Discard patch within 12 hours or as directed by MD     [provider]  oxyCODONE-acetaminophen (PERCOCET/ROXICET) 5-325 MG tablet Take 1 tablet by mouth every 6 (six) hours as needed for severe pain. 09/30/22   Blanchie Dessert, MD  Polyvinyl Alcohol-Povidone (REFRESH OP) Place 1 drop into both eyes 3 (three) times daily as needed (dry eyes).    [provider]  predniSONE (DELTASONE) 20 MG tablet 3 tabs po day one, then 2 tabs daily x 4 days 02/20/22   Domenic Moras, PA-C  promethazine (PHENERGAN) 12.5 MG tablet Take 1 tablet (12.5 mg total) by mouth every 6 (six) hours as needed for nausea or vomiting. 09/30/22   Blanchie Dessert, MD  traZODone (DESYREL) 50 MG tablet Take 0.5 tablets (25 mg total) by mouth at bedtime as needed for sleep. 02/22/22   Allie Bossier, MD      Allergies    Hydrocodone, Ketorolac, Morphine and related, Tramadol, Venlafaxine, Betamethasone, Zofran [ondansetron], and Hydroxyzine    Review of Systems   Review of Systems  Gastrointestinal:  Positive for abdominal pain, nausea and vomiting.  Psychiatric/Behavioral:  The patient is nervous/anxious.   All other systems reviewed and are negative.   Physical Exam Updated Vital Signs BP (!) 102/47   Pulse 79   Temp 98.3 F (36.8 C) (Oral)   Resp 14   Ht 5\' 4"  (1.626 m)   Wt 56.7 kg   SpO2 99%   BMI 21.46 kg/m  Physical Exam Vitals and nursing note reviewed.  Constitutional:  General: She is in acute distress.     Appearance: She is well-developed and normal weight. She is not toxic-appearing or diaphoretic.  HENT:     Head: Normocephalic and atraumatic.     Mouth/Throat:     Mouth: Mucous membranes are moist.  Eyes:     Conjunctiva/sclera: Conjunctivae normal.  Cardiovascular:     Rate and Rhythm: Regular rhythm. Tachycardia present.     Heart sounds: No murmur heard. Pulmonary:     Effort: Pulmonary effort is normal. No respiratory distress.     Breath sounds: Normal breath sounds. No wheezing or rales.  Abdominal:     Palpations:  Abdomen is soft.     Tenderness: There is abdominal tenderness in the right upper quadrant.  Musculoskeletal:        General: No swelling.     Cervical back: Neck supple.  Skin:    General: Skin is warm and dry.     Capillary Refill: Capillary refill takes less than 2 seconds.     Coloration: Skin is not cyanotic, jaundiced or pale.  Neurological:     General: No focal deficit present.     Mental Status: She is alert and oriented to person, place, and time.  Psychiatric:        Mood and Affect: Mood is anxious. Affect is tearful.     ED Results / Procedures / Treatments   Labs (all labs ordered are listed, but only abnormal results are displayed) Labs Reviewed  COMPREHENSIVE METABOLIC PANEL - Abnormal; Notable for the following components:      Result Value   Glucose, Bld 109 (*)    All other components within normal limits  CBC WITH DIFFERENTIAL/PLATELET - Abnormal; Notable for the following components:   WBC 17.1 (*)    RBC 5.32 (*)    Hemoglobin 15.8 (*)    HCT 48.5 (*)    Neutro Abs 15.0 (*)    Monocytes Absolute 1.1 (*)    All other components within normal limits  URINALYSIS, ROUTINE W REFLEX MICROSCOPIC - Abnormal; Notable for the following components:   APPearance CLOUDY (*)    All other components within normal limits  LIPASE, BLOOD  I-STAT BETA HCG BLOOD, ED ([facility], [facility], [facility] ONLY)    EKG None  Radiology No results found.  Procedures Procedures    Medications Ordered in ED Medications  fentaNYL (SUBLIMAZE) injection 50 mcg (has no administration in time range)  fentaNYL (SUBLIMAZE) injection 50 mcg (50 mcg Intravenous Given 12/19/22 1500)  diphenhydrAMINE (BENADRYL) injection 12.5 mg (12.5 mg Intravenous Given 12/19/22 1501)  lactated ringers bolus 1,000 mL (1,000 mLs Intravenous New Bag/Given 12/19/22 1540)  prochlorperazine (COMPAZINE) injection 10 mg (10 mg Intravenous Given 12/19/22 1501)  LORazepam (ATIVAN) injection 1 mg (1 mg Intravenous Given 12/19/22 1541)     ED Course/ Medical Decision Making/ A&P                             Medical Decision Making Amount and/or Complexity of Data Reviewed Labs: ordered. Radiology: ordered.  Risk Prescription drug management.   This patient presents to the ED for concern of abdominal pain, nausea, vomiting, this involves an extensive number of treatment options, and is a complaint that carries with it a high risk of complications and morbidity.  The differential diagnosis includes cholecystitis, hepatitis, pancreatitis, SBO, PID, ovarian torsion   Co morbidities that complicate the patient evaluation  ADHD, anemia, anxiety, endometriosis,  nephrolithiasis, ovarian cyst, PID, seizures   Additional history obtained:  Additional history obtained from EMS External records from outside source obtained and reviewed including EMR   Lab Tests:  I Ordered, and personally interpreted labs.  The pertinent results include: Leukocytosis is present.  Hemoglobin is elevated consistent with hemoconcentration.  Kidney function and electrolytes are normal.  Lipase is normal.  Urine shows no evidence of infection.   Imaging Studies ordered:  I ordered imaging studies including right upper quadrant and pelvic ultrasounds I independently visualized and interpreted imaging which showed (pending at time of signout) I agree with the radiologist interpretation   Cardiac Monitoring: / EKG:  The patient was maintained on a cardiac monitor.  I personally viewed and interpreted the cardiac monitored which showed an underlying rhythm of: Sinus rhythm   Problem List / ED Course / Critical interventions / Medication management  Patient presenting for abdominal pain, nausea, vomiting.  She arrives via EMS.  She is distressed on arrival.  She is tachycardic in the range of 140s.  She endorses pain in the right upper quadrant.  Tenderness is present.  Nausea is currently uncontrolled.  IV fluids, Compazine, fentanyl, and  Ativan were ordered for symptomatic relief.  Laboratory workup was initiated.  Patient undergo right upper quadrant ultrasound.  Additionally, patient has history of endometriosis and PID.  Will obtain pelvic ultrasound as well.  On reassessment, patient has improved pain and nausea.  Tachycardia has resolved.  Anxiety and distress have resolved as well.  Patient does endorse improved although continued pain in the right upper quadrant.  Additional fentanyl was ordered.  Initial lab work is notable for leukocytosis.  Care of patient was signed out to oncoming ED provider. I ordered medication including IV fluids for dehydration; fentanyl for analgesia; Compazine, Benadryl, and Ativan for nausea Reevaluation of the patient after these medicines showed that the patient improved I have reviewed the patients home medicines and have made adjustments as needed   Social Determinants of Health:  Has PCP        Final Clinical Impression(s) / ED Diagnoses Final diagnoses:  Right upper quadrant abdominal pain  Nausea and vomiting, unspecified vomiting type    Rx / DC Orders ED Discharge Orders     None         Godfrey Pick, MD 12/19/22 1630

## 2023-03-26 ENCOUNTER — Telehealth: Payer: Self-pay | Admitting: Genetic Counselor

## 2023-03-26 NOTE — Telephone Encounter (Signed)
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  Genetics appt scheduled for 6/27 at 1pm.

## 2023-05-10 ENCOUNTER — Inpatient Hospital Stay: Payer: Medicaid Other

## 2023-05-10 ENCOUNTER — Inpatient Hospital Stay: Payer: Medicaid Other | Attending: Internal Medicine | Admitting: Genetic Counselor

## 2023-05-10 ENCOUNTER — Other Ambulatory Visit: Payer: Self-pay | Admitting: Genetic Counselor

## 2023-05-10 DIAGNOSIS — Z8481 Family history of carrier of genetic disease: Secondary | ICD-10-CM

## 2023-05-10 DIAGNOSIS — Z8041 Family history of malignant neoplasm of ovary: Secondary | ICD-10-CM

## 2023-05-10 DIAGNOSIS — Z800 Family history of malignant neoplasm of digestive organs: Secondary | ICD-10-CM

## 2023-05-10 DIAGNOSIS — Z8042 Family history of malignant neoplasm of prostate: Secondary | ICD-10-CM | POA: Diagnosis not present

## 2023-05-10 DIAGNOSIS — Z803 Family history of malignant neoplasm of breast: Secondary | ICD-10-CM

## 2023-05-10 LAB — GENETIC SCREENING ORDER

## 2023-05-14 ENCOUNTER — Encounter: Payer: Self-pay | Admitting: Genetic Counselor

## 2023-05-14 NOTE — Progress Notes (Signed)
REFERRING PROVIDER: Self-referred  PRIMARY PROVIDER:  Pa, [facility]  PRIMARY REASON FOR VISIT:  1. Family history of BRIP1 gene mutation   2. Family history of pancreatic cancer     HISTORY OF PRESENT ILLNESS:   [name], a 32 y.o. female, was seen for a [facility] cancer genetics consultation due to a family history of a BRIP1 gene mutation in her paternal grandmother.  Abigail Harding presents to clinic today to discuss the possibility of a hereditary predisposition to cancer, to discuss genetic testing, and to further clarify her future cancer risks, as well as potential cancer risks for family members.    Abigail Harding is a 32 y.o. female with no personal history of cancer.    CANCER HISTORY:  Oncology History   No history exists.    RISK FACTORS:  Mammogram within the last year: no Number of breast biopsies: 0. Colonoscopy: no; not examined. Hysterectomy: no.  Ovaries intact: yes.  Menarche was at age 26.  First live birth at age 42.  OCP use for less than 5 years.    Past Medical History:  Diagnosis Date   ADHD (attention deficit hyperactivity disorder)    Anemia    Anxiety    Asthma    Back pain    Endometriosis    Headache    History of fainting    History of kidney stones    In the past   Ovarian cyst    PID (pelvic inflammatory disease)    Seizures (HCC)     Past Surgical History:  Procedure Laterality Date   DILATION AND EVACUATION N/A 02/19/2017   Procedure: DILATATION AND EVACUATION;  Surgeon: Herold Harms, MD;  Location: [facility] ORS;  Service: Gynecology;  Laterality: N/A;   LAPAROSCOPY     LAPAROSCOPY N/A 12/25/2016   Procedure: LAPAROSCOPY DIAGNOSTIC WITH BIOPSIES;  Surgeon: Herold Harms, MD;  Location: [facility] ORS;  Service: Gynecology;  Laterality: N/A;   TONSILLECTOMY       FAMILY HISTORY:  We obtained a detailed, 4-generation family history.  Significant diagnoses are listed below: Family History  Problem  Relation Age of Onset   Pancreatic cancer Paternal Grandmother 1       BRIP1 gene mutation   Prostate cancer Other        MGF's brother; ? mets   Pancreatic cancer Other        great great grandmother on maternal side   Breast cancer Paternal Great-grandmother        PGM's mother; dx >50?     Ms. Segarra paternal grandmother had genetic testing given her diagnosis of pancreatic cancer.  Her grandmother's genetic testing showed a pathogenic variant in the BRIP1 gene called [id number].  No other pathogenic variants were detected in [facility] CancerNext-Expanded +RNAinsight Panel.  The CancerNext-Expanded gene panel offered by [facility] and includes sequencing, rearrangement, and RNA analysis for the following 71 genes:  AIP, ALK, APC, ATM, BAP1, BARD1, BMPR1A, BRCA1, BRCA2, BRIP1, CDC73, CDH1, CDK4, CDKN1B, CDKN2A, CHEK2, DICER1, FH, FLCN, KIF1B, LZTR1,MAX, MEN1, MET, MLH1, MSH2, MSH6, MUTYH, NF1, NF2, NTHL1, PALB2, PHOX2B, PMS2, POT1, PRKAR1A, PTCH1, PTEN, RAD51C,RAD51D, RB1, RET, SDHA, SDHAF2, SDHB, SDHC, SDHD, SMAD4, SMARCA4, SMARCB1, SMARCE1, STK11, SUFU, TMEM127, TP53,TSC1, TSC2 and VHL (sequencing and deletion/duplication); AXIN2, CTNNA1, EGFR, EGLN1, HOXB13, KIT, MITF, MSH3, PDGFRA, POLD1 and POLE (sequencing only); EPCAM and GREM1 (deletion/duplication only).   Abigail Harding is unaware of previous other family history of genetic testing for hereditary cancer risks. There is  no reported Ashkenazi Jewish ancestry. There is no known consanguinity.  GENETIC COUNSELING ASSESSMENT: [name] is a 32 y.o. female with a family history of a BRIP1 gene mutation. We, therefore, discussed and recommended the following at today's visit.   DISCUSSION: We discussed that 5 - 10% of cancer is hereditary.  We discussed that her grandmother's BRIP1 gene mutation does not explain her grandmother's personal history of pancreatic cancer; however, it does impact cancer risk for other relatives.  Given  her paternal grandmother has a mutation in the BRIP1 gene, she has a 25% chance of having the same family mutation in BRIP1.  We reviewed the ovarian cancer risks and management strategies associated with BRIP1 mutations.   We discussed that testing is beneficial for several reasons, including knowing about other cancer risks, identifying potential screening and risk-reduction options that may be appropriate, and to understanding if other family members could be at risk for cancer and allowing them to undergo genetic testing.  Abigail Harding also has a maternal family history of cancer in more distant relatives (great great grandfather with pancreatic cancer) and great uncle with possible metastatic prostate cancer.  We discussed that it would be more informative for relatives more closely related to the cancer in this side of the family to have genetic testing.  However, we offered panel-based testing given other relatives were unavailable for genetic testing.  We discussed benefits/limitations of panel-based testing and single site anlaysis for the BRIP1 gene mutation.  She opted to proceed with panel testing.   The CancerNext-Expanded gene panel offered by [facility] and includes sequencing, rearrangement, and RNA analysis for the following 77 genes: AIP, ALK, APC, ATM, AXIN2, BAP1, BARD1, BLM, BMPR1A, BRCA1, BRCA2, BRIP1, CDC73, CDH1, CDK4, CDKN1B, CDKN2A, CHEK2, CTNNA1, DICER1, FANCC, FH, FLCN, GALNT12, KIF1B, LZTR1, MAX, MEN1, MET, MLH1, MSH2, MSH3, MSH6, MUTYH, NBN, NF1, NF2, NTHL1, PALB2, PHOX2B, PMS2, POT1, PRKAR1A, PTCH1, PTEN, RAD51C, RAD51D, RB1, RECQL, RET, SDHA, SDHAF2, SDHB, SDHC, SDHD, SMAD4, SMARCA4, SMARCB1, SMARCE1, STK11, SUFU, TMEM127, TP53, TSC1, TSC2, VHL and XRCC2 (sequencing and deletion/duplication); EGFR, EGLN1, HOXB13, KIT, MITF, PDGFRA, POLD1, and POLE (sequencing only); EPCAM and GREM1 (deletion/duplication only).   We discussed the implications of a negative, positive, and  variant of uncertain significant result.   Based on Abigail Harding's family history of a known BRIP1 mutation, she meets medical criteria for genetic testing.   We discussed the Genetic Information Non-Discrimination Act (GINA) of 2008, which helps protect individuals against genetic discrimination based on their genetic test results.  It impacts both health insurance and employment.  With health insurance, it protects against genetic test results being used for increased premiums or policy termination. For employment, it protects against hiring, firing and promoting decisions based on genetic test results.  GINA does not apply to those in the [employer], those who work for companies with less than 15 employees, and new life insurance or long-term disability insurance policies.  Health status due to a cancer diagnosis is not protected under GINA.  PLAN: After considering the risks, benefits, and limitations, Ms. Eichel provided informed consent to pursue genetic testing and the blood sample was sent to [facility] for analysis of the CancerNext-Expanded +RNAinsight Panel. Results should be available within approximately 3 weeks' time, at which point they will be disclosed by telephone to Ms. Mccullars, as will any additional recommendations warranted by these results. Ms. Pirrello will receive a summary of her genetic counseling visit and a copy of her results once available. This  information will also be available in Epic.   Ms. Sellin questions were answered to her satisfaction today. Our contact information was provided should additional questions or concerns arise.  Alinna Siple M. Rennie Plowman, MS, [facility] Genetic Counselor [email or web address] (P) [phone]   The patient was seen for a total of 30 minutes in face-to-face genetic counseling.  The patient was seen with her sister,Jessica. Drs. Gunnar Bulla and/or Mosetta Putt were available to discuss this case as needed.   _______________________________________________________________________ For Office Staff:  Number of people involved in session: 2 Was an Intern/ student involved with case: yes; [employer] intern Maddy observed session

## 2023-06-04 ENCOUNTER — Encounter: Payer: Self-pay | Admitting: Genetic Counselor

## 2023-06-04 ENCOUNTER — Telehealth: Payer: Self-pay | Admitting: Genetic Counselor

## 2023-06-04 ENCOUNTER — Ambulatory Visit: Payer: Self-pay | Admitting: Genetic Counselor

## 2023-06-04 DIAGNOSIS — Z800 Family history of malignant neoplasm of digestive organs: Secondary | ICD-10-CM

## 2023-06-04 DIAGNOSIS — Z1589 Genetic susceptibility to other disease: Secondary | ICD-10-CM

## 2023-06-04 DIAGNOSIS — Z8481 Family history of carrier of genetic disease: Secondary | ICD-10-CM

## 2023-06-04 DIAGNOSIS — Z8041 Family history of malignant neoplasm of ovary: Secondary | ICD-10-CM

## 2023-06-04 DIAGNOSIS — Z1379 Encounter for other screening for genetic and chromosomal anomalies: Secondary | ICD-10-CM

## 2023-06-04 NOTE — Telephone Encounter (Addendum)
I contacted Abigail Harding to discuss her genetic testing results. 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 Detailed clinic note to follow.  The test report has been scanned into EPIC and is located under the Molecular Pathology section of the Results Review tab.  A portion of the result report is included below for reference.   Lalla Brothers, MS, [facility] Genetic Counselor [email or web address] (P) [phone]

## 2023-06-14 NOTE — Progress Notes (Signed)
GENETIC TEST RESULTS  Patient Name: Abigail Harding Patient Age: 32 y.o. Encounter Date: 06/04/2023  Referring Provider: Self-referred   Ms. Scarola was seen in the Cancer Genetics clinic on May 10, 2023 due to a family history of a known BRIP1 gene mutation in her paternal grandmother and concern regarding a hereditary predisposition to cancer in the family. Please refer to the prior Genetics clinic note for more information regarding Ms. Ricciuti's medical and family histories and our assessment at the time.   FAMILY HISTORY:  We obtained a detailed, 4-generation family history.  Significant diagnoses are listed below:      Family History  Problem Relation Age of Onset   Pancreatic cancer Paternal Grandmother 87        BRIP1 gene mutation   Prostate cancer Other          MGF's brother; ? mets   Pancreatic cancer Other          great great grandmother on maternal side   Breast cancer Paternal Great-grandmother          PGM's mother; dx >50?       Ms. Fagnani paternal grandmother had genetic testing given her diagnosis of pancreatic cancer.  Her grandmother's genetic testing showed a pathogenic variant in the BRIP1 gene called [id number].  No other pathogenic variants were detected in [facility] CancerNext-Expanded +RNAinsight Panel.  The CancerNext-Expanded gene panel offered by [facility] and includes sequencing, rearrangement, and RNA analysis for the following 71 genes:  AIP, ALK, APC, ATM, BAP1, BARD1, BMPR1A, BRCA1, BRCA2, BRIP1, CDC73, CDH1, CDK4, CDKN1B, CDKN2A, CHEK2, DICER1, FH, FLCN, KIF1B, LZTR1,MAX, MEN1, MET, MLH1, MSH2, MSH6, MUTYH, NF1, NF2, NTHL1, PALB2, PHOX2B, PMS2, POT1, PRKAR1A, PTCH1, PTEN, RAD51C,RAD51D, RB1, RET, SDHA, SDHAF2, SDHB, SDHC, SDHD, SMAD4, SMARCA4, SMARCB1, SMARCE1, STK11, SUFU, TMEM127, TP53,TSC1, TSC2 and VHL (sequencing and deletion/duplication); AXIN2, CTNNA1, EGFR, EGLN1, HOXB13, KIT, MITF, MSH3, PDGFRA, POLD1 and POLE (sequencing only);  EPCAM and GREM1 (deletion/duplication only).    Ms. Thammavong is unaware of previous other family history of genetic testing for hereditary cancer risks. There is no reported Ashkenazi Jewish ancestry. There is no known consanguinity.  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  This the same BRIP1 variant that was previously detected in her paternal grandmother.   No other pathogenic variants were detected in the [facility] CancerNext-Expanded +RNAinsight Panel.  The CancerNext-Expanded gene panel offered by [facility] and includes sequencing, rearrangement, and RNA analysis for the following 71 genes:  AIP, ALK, APC, ATM, BAP1, BARD1, BMPR1A, BRCA1, BRCA2, BRIP1, CDC73, CDH1, CDK4, CDKN1B, CDKN2A, CHEK2, DICER1, FH, FLCN, KIF1B, LZTR1,MAX, MEN1, MET, MLH1, MSH2, MSH6, MUTYH, NF1, NF2, NTHL1, PALB2, PHOX2B, PMS2, POT1, PRKAR1A, PTCH1, PTEN, RAD51C,RAD51D, RB1, RET, SDHA, SDHAF2, SDHB, SDHC, SDHD, SMAD4, SMARCA4, SMARCB1, SMARCE1, STK11, SUFU, TMEM127, TP53,TSC1, TSC2 and VHL (sequencing and deletion/duplication); AXIN2, CTNNA1, EGFR, EGLN1, HOXB13, KIT, MITF, MSH3, PDGFRA, POLD1 and POLE (sequencing only); EPCAM and GREM1 (deletion/duplication only).     The test report has been scanned into EPIC and is located under the Molecular Pathology section of the Results Review tab.  A portion of the result report is included below for reference. Genetic testing reported out on June 01, 2023.       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  At this time, it is unknown if this variant is associated with increased cancer risk or if this is  a normal finding, but most variants such as this get reclassified to being inconsequential. It should not be used to make medical management decisions. With time, we suspect the lab will determine the significance of this variant, if any. If we do  learn more about it, we will try to contact Ms. Mczeal to discuss it further. However, it is important to stay in touch with [place] periodically and keep the address and phone number up to date.   Cancer Risks for BRIP1: Ovarian cancer, 5-15% Currently, there is no known increase in cancer risk for males with a BRIP1 mutation.   Management Recommendations:   Ovarian Cancer Screening/Risk Reduction: A risk-reducing salpingo oophorectomy (RRSO), removal of the ovaries and fallopian tubes, is recommended starting at age 4-50 or earlier based on a specific family history of an earlier onset ovarian cancer. Having a RRSO is estimated to reduce the risk of ovarian cancer by up to 96%. There is still a small risk of developing an "ovarian-like" cancer in the lining of the abdomen, called the peritoneum.  Additional Considerations: Individuals with a single pathogenic BRIP1 variant are carriers of Fanconi anemia.  Fanconi anemia is a condition characterized by bone marrow failure, short stature, abnormal skin pigmentation, developmental delay and malformations of the thumbs, skeletal and central nervous systems. The risks for leukemia and early onset solid tumors are significantly elevated. For there to be a risk of Fanconi anemia in offspring, both the patient and their partner would each have to carry a pathogenic variant in BRIP1. In this case, the risk of having an affected child is 25%.    This information is based on current understanding of the gene and may change in the future.   Implications for Family Members: Hereditary predisposition to cancer due to pathogenic variants in the BRIP1 gene has autosomal dominant inheritance. This means that an individual with a pathogenic variant has a 50% chance of passing the condition on to his/her offspring. Identification of a pathogenic variant allows for the recognition of at-risk relatives who can pursue testing for the familial variant.   Family  members are encouraged to consider genetic testing for this familial pathogenic variant. As there are generally no childhood cancer risks associated with single pathogenic variants in the BRIP1 gene, individuals in the family are not recommended to have testing until they reach at least 30 years of age. They may contact our office at [phone] for more information or to schedule an appointment.  Complimentary testing for the familial variant is available for 90 days after her report date.  Family members who live outside of the area are encouraged to find a genetic counselor in their area by visiting: [email or web address].   Carley Strickling M. Rennie Plowman, MS, [facility] Genetic Counselor [email or web address] (P) [phone]

## 2023-08-10 ENCOUNTER — Encounter (HOSPITAL_COMMUNITY): Payer: Self-pay | Admitting: *Deleted

## 2023-08-10 ENCOUNTER — Emergency Department (HOSPITAL_COMMUNITY)
Admission: EM | Admit: 2023-08-10 | Discharge: 2023-08-10 | Disposition: A | Discharge status: Home / Self Care | Payer: Medicaid Other

## 2023-08-10 ENCOUNTER — Emergency Department (HOSPITAL_COMMUNITY): Payer: Medicaid Other

## 2023-08-10 ENCOUNTER — Other Ambulatory Visit: Payer: Self-pay

## 2023-08-10 DIAGNOSIS — Z7951 Long term (current) use of inhaled steroids: Secondary | ICD-10-CM | POA: Diagnosis not present

## 2023-08-10 DIAGNOSIS — R112 Nausea with vomiting, unspecified: Secondary | ICD-10-CM | POA: Diagnosis not present

## 2023-08-10 DIAGNOSIS — R109 Unspecified abdominal pain: Secondary | ICD-10-CM | POA: Diagnosis present

## 2023-08-10 DIAGNOSIS — J45909 Unspecified asthma, uncomplicated: Secondary | ICD-10-CM | POA: Insufficient documentation

## 2023-08-10 LAB — COMPREHENSIVE METABOLIC PANEL
ALT: 9 U/L (ref 0–44)
AST: 21 U/L (ref 15–41)
Albumin: 4.4 g/dL (ref 3.5–5.0)
Alkaline Phosphatase: 41 U/L (ref 38–126)
Anion gap: 8 (ref 5–15)
BUN: 6 mg/dL (ref 6–20)
CO2: 25 mmol/L (ref 22–32)
Calcium: 9.4 mg/dL (ref 8.9–10.3)
Chloride: 104 mmol/L (ref 98–111)
Creatinine, Ser: 0.9 mg/dL (ref 0.44–1.00)
GFR, Estimated: >60 mL/min (ref >60)
Glucose, Bld: 90 mg/dL (ref 70–99)
Potassium: 3.8 mmol/L (ref 3.5–5.1)
Sodium: 137 mmol/L (ref 135–145)
Total Bilirubin: 0.8 mg/dL (ref 0.3–1.2)
Total Protein: 7.3 g/dL (ref 6.5–8.1)

## 2023-08-10 LAB — URINALYSIS, ROUTINE W REFLEX MICROSCOPIC
Bilirubin Urine: NEGATIVE
Glucose, UA: NEGATIVE mg/dL
Hgb urine dipstick: NEGATIVE
Ketones, ur: NEGATIVE mg/dL
Nitrite: NEGATIVE
Protein, ur: NEGATIVE mg/dL
Specific Gravity, Urine: 1.012 (ref 1.005–1.030)
pH: 6 (ref 5.0–8.0)

## 2023-08-10 LAB — CBC
HCT: 40.2 % (ref 36.0–46.0)
Hemoglobin: 13.2 g/dL (ref 12.0–15.0)
MCH: 29.2 pg (ref 26.0–34.0)
MCHC: 32.8 g/dL (ref 30.0–36.0)
MCV: 88.9 fL (ref 80.0–100.0)
Platelets: 264 10*3/uL (ref 150–400)
RBC: 4.52 MIL/uL (ref 3.87–5.11)
RDW: 11.8 % (ref 11.5–15.5)
WBC: 8.3 10*3/uL (ref 4.0–10.5)
nRBC: 0 % (ref 0.0–0.2)

## 2023-08-10 LAB — HCG, SERUM, QUALITATIVE: Preg, Serum: NEGATIVE

## 2023-08-10 LAB — LIPASE, BLOOD: Lipase: 31 U/L (ref 11–51)

## 2023-08-10 MED ORDER — PROMETHAZINE (PHENERGAN) 6.25MG IN NS 50ML IVPB: 6.2500 mg | Freq: Four times a day (QID) | INTRAVENOUS | Status: DC | PRN

## 2023-08-10 MED ORDER — PROMETHAZINE (PHENERGAN) 6.25MG IN NS 50ML IVPB
6.2500 mg | Freq: Four times a day (QID) | INTRAVENOUS | Status: DC | PRN
  Administered 2023-08-10: 6.25 mg via INTRAVENOUS
  Filled 2023-08-10: qty 6.25
  Filled 2023-08-10: qty 50

## 2023-08-10 MED ORDER — HYOSCYAMINE SULFATE 0.125 MG SL SUBL: 0.1250 mg | SUBLINGUAL_TABLET | SUBLINGUAL | 0 refills | Status: DC | PRN

## 2023-08-10 MED ORDER — HYDROMORPHONE HCL 1 MG/ML IJ SOLN
0.5000 mg | Freq: Once | INTRAMUSCULAR | Status: AC
  Administered 2023-08-10: 0.5 mg via INTRAVENOUS
  Filled 2023-08-10: qty 1

## 2023-08-10 MED ORDER — IOHEXOL 350 MG/ML SOLN
75.0000 mL | Freq: Once | INTRAVENOUS | Status: AC | PRN
  Administered 2023-08-10: 75 mL via INTRAVENOUS

## 2023-08-10 MED ORDER — SODIUM CHLORIDE 0.9 % IV BOLUS
1000.0000 mL | Freq: Once | INTRAVENOUS | Status: AC
  Administered 2023-08-10: 1000 mL via INTRAVENOUS

## 2023-08-10 MED ORDER — DIAZEPAM 5 MG/ML IJ SOLN
2.5000 mg | Freq: Once | INTRAMUSCULAR | Status: AC
  Administered 2023-08-10: 2.5 mg via INTRAVENOUS
  Filled 2023-08-10: qty 2

## 2023-08-10 MED ORDER — DIPHENHYDRAMINE HCL 50 MG/ML IJ SOLN
12.5000 mg | Freq: Once | INTRAMUSCULAR | Status: AC
  Administered 2023-08-10: 12.5 mg via INTRAVENOUS
  Filled 2023-08-10: qty 1

## 2023-08-10 MED ORDER — CEPHALEXIN 250 MG PO CAPS: 500.0000 mg | ORAL_CAPSULE | Freq: Once | ORAL | Status: DC

## 2023-08-10 MED ORDER — PROMETHAZINE HCL 25 MG PO TABS: 25.0000 mg | ORAL_TABLET | Freq: Four times a day (QID) | ORAL | 0 refills | Status: DC | PRN

## 2023-08-10 MED ORDER — HYDROMORPHONE HCL 1 MG/ML IJ SOLN
0.5000 mg | INTRAMUSCULAR | Status: DC | PRN
  Administered 2023-08-10: 0.5 mg via INTRAVENOUS
  Filled 2023-08-10: qty 1

## 2023-08-10 NOTE — ED Triage Notes (Addendum)
The pt has had  rt abdomen and rt back pain for 2 weeks  she has a c-t yesterday and she was found to have  an obstructing kidney stone x2  lmp none

## 2023-08-10 NOTE — ED Provider Notes (Signed)
[place] EMERGENCY DEPARTMENT AT [facility] Provider Note   CSN: [id number] Arrival date & time: 08/10/23  1507     History  No chief complaint on file.   Abigail Harding is a 32 y.o. female.  32 year old female with past medical history of kidney stones, ADHD, asthma, and seizures presenting to the emergency department today with abdominal pain.  The patient states that this has been going on for the past 2 weeks and has been relatively constant.  She reports some episodes of nausea and vomiting.  These were nonbloody and nonbilious.  She reports normal bowel movements with this.  She followed up with her doctor yesterday and had a CT scan performed.  She showed me the report on her phone and it appears that there was an area of intussusception in the small bowel noted.  There was some nonobstructing kidney stones as well.  The patient denies any urinary symptoms with this.  Denies any fevers.  She came to the ER today for further evaluation due to these ongoing symptoms.        Home Medications Prior to Admission medications   Medication Sig Start Date End Date Taking? Authorizing Provider  hyoscyamine (LEVSIN/SL) 0.125 MG SL tablet Place 1 tablet (0.125 mg total) under the tongue every 4 (four) hours as needed. 08/10/23  Yes Durwin Glaze, MD  promethazine (PHENERGAN) 25 MG tablet Take 1 tablet (25 mg total) by mouth every 6 (six) hours as needed for nausea or vomiting. 08/10/23  Yes Durwin Glaze, MD  albuterol (VENTOLIN HFA) 108 (90 Base) MCG/ACT inhaler Inhale 2 puffs into the lungs daily as needed for shortness of breath. 05/15/21   [provider]  clonazePAM (KLONOPIN) 0.5 MG tablet Take 1 tablet (0.5 mg total) by mouth daily as needed (anxiety). 02/22/22   Drema Dallas, MD  cyclobenzaprine (FLEXERIL) 5 MG tablet Take 1 tablet (5 mg total) by mouth 3 (three) times daily as needed for muscle spasms. Patient taking differently: Take 5 mg by mouth daily as  needed for muscle spasms. 02/20/22   Fayrene Helper, PA-C  Drospirenone (SLYND) 4 MG TABS Take 1 tablet by mouth daily. Pt skips placebo pills and continues to next pack in order to skip period 09/30/22   Gwyneth Sprout, MD  fluticasone (FLONASE) 50 MCG/ACT nasal spray Place 1 spray into the nose daily as needed.    [provider]  lidocaine (LIDODERM) 5 % Place 1 patch onto the skin daily as needed (pain). Remove & Discard patch within 12 hours or as directed by MD    [provider]  metoCLOPramide (REGLAN) 10 MG tablet Take 1 tablet (10 mg total) by mouth every 6 (six) hours as needed for nausea or vomiting. 12/19/22   Alvira Monday, MD  oxyCODONE-acetaminophen (PERCOCET/ROXICET) 5-325 MG tablet Take 1 tablet by mouth every 6 (six) hours as needed for severe pain. 09/30/22   Gwyneth Sprout, MD  pantoprazole (PROTONIX) 40 MG tablet Take 1 tablet (40 mg total) by mouth daily for 14 days. 12/19/22 01/02/23  Alvira Monday, MD  Polyvinyl Alcohol-Povidone (REFRESH OP) Place 1 drop into both eyes 3 (three) times daily as needed (dry eyes).    [provider]  predniSONE (DELTASONE) 20 MG tablet 3 tabs po day one, then 2 tabs daily x 4 days 02/20/22   Fayrene Helper, PA-C  promethazine (PHENERGAN) 12.5 MG tablet Take 1 tablet (12.5 mg total) by mouth every 6 (six) hours as needed  for nausea or vomiting. 09/30/22   Gwyneth Sprout, MD  traZODone (DESYREL) 50 MG tablet Take 0.5 tablets (25 mg total) by mouth at bedtime as needed for sleep. 02/22/22   Drema Dallas, MD      Allergies    Hydrocodone, Ketorolac, Morphine and codeine, Tramadol, Venlafaxine, Betamethasone, Zofran [ondansetron], and Hydroxyzine    Review of Systems   Review of Systems  Gastrointestinal:  Positive for abdominal pain, nausea and vomiting.  All other systems reviewed and are negative.   Physical Exam Updated Vital Signs BP (!) 94/55 (BP Location: Right Arm)   Pulse 69   Temp 97.9 F (36.6 C)  (Oral)   Resp 17   Ht 5\' 4"  (1.626 m)   Wt 56.7 kg   LMP  (LMP Unknown)   SpO2 100%   BMI 21.46 kg/m  Physical Exam Vitals and nursing note reviewed.   Gen: Appears uncomfortable Eyes: PERRL, EOMI HEENT: no oropharyngeal swelling Neck: trachea midline Resp: clear to auscultation bilaterally Card: RRR, no murmurs, rubs, or gallops Abd: Diffusely tender with no guarding or rebound Extremities: no calf tenderness, no edema Vascular: 2+ radial pulses bilaterally, 2+ DP pulses bilaterally Skin: no rashes Psyc: acting appropriately   ED Results / Procedures / Treatments   Labs (all labs ordered are listed, but only abnormal results are displayed) Labs Reviewed  URINALYSIS, ROUTINE W REFLEX MICROSCOPIC - Abnormal; Notable for the following components:      Result Value   Leukocytes,Ua TRACE (*)    Bacteria, UA RARE (*)    All other components within normal limits  URINE CULTURE  LIPASE, BLOOD  COMPREHENSIVE METABOLIC PANEL  CBC  HCG, SERUM, QUALITATIVE    EKG None  Radiology CT ABDOMEN PELVIS W CONTRAST  Result Date: 08/10/2023 CLINICAL DATA:  Abdominal/flank pain. EXAM: CT ABDOMEN AND PELVIS WITH CONTRAST TECHNIQUE: Multidetector CT imaging of the abdomen and pelvis was performed using the standard protocol following bolus administration of intravenous contrast. RADIATION DOSE REDUCTION: This exam was performed according to the departmental dose-optimization program which includes automated exposure control, adjustment of the mA and/or kV according to patient size and/or use of iterative reconstruction technique. CONTRAST:  75mL OMNIPAQUE IOHEXOL 350 MG/ML SOLN COMPARISON:  12/19/2022. FINDINGS: Lower chest: No acute abnormality. Hepatobiliary: There is focal fatty infiltration of the the left lobe of the liver adjacent to the falciform ligament. No biliary ductal dilatation. The gallbladder is without stones. Pancreas: Unremarkable. No pancreatic ductal dilatation or  surrounding inflammatory changes. Spleen: Normal in size without focal abnormality. Adrenals/Urinary Tract: The adrenal glands are within normal limits. The kidneys enhance symmetrically. A nonobstructive renal calculus is present on the right. No ureteral calculus or obstructive uropathy bilaterally. The bladder is unremarkable. Stomach/Bowel: Stomach is within normal limits. Appendix is not seen and surgical changes are present at the cecum. No evidence of bowel wall thickening, distention, or inflammatory changes. No free air or pneumatosis. Vascular/Lymphatic: No significant vascular findings are present. No enlarged abdominal or pelvic lymph nodes. Reproductive: Uterus and bilateral adnexa are unremarkable. Other: A trace amount of free fluid is noted in the right adnexa/cul-de-sac. Musculoskeletal: No acute osseous abnormality. IMPRESSION: 1. No acute intra-abdominal process. 2. Nonobstructive right renal calculus. Electronically Signed   By: Thornell Sartorius M.D.   On: 08/10/2023 21:37    Procedures Procedures    Medications Ordered in ED Medications  HYDROmorphone (DILAUDID) injection 0.5 mg (0.5 mg Intravenous Given 08/10/23 1959)  promethazine (PHENERGAN) 6.25 mg/NS 50 mL IVPB (0  mg Intravenous Stopped 08/10/23 2153)  sodium chloride 0.9 % bolus 1,000 mL (0 mLs Intravenous Stopped 08/10/23 2234)  iohexol (OMNIPAQUE) 350 MG/ML injection 75 mL (75 mLs Intravenous Contrast Given 08/10/23 2129)  diazepam (VALIUM) injection 2.5 mg (2.5 mg Intravenous Given 08/10/23 2107)  HYDROmorphone (DILAUDID) injection 0.5 mg (0.5 mg Intravenous Given 08/10/23 2233)  diphenhydrAMINE (BENADRYL) injection 12.5 mg (12.5 mg Intravenous Given 08/10/23 2233)    ED Course/ Medical Decision Making/ A&P                                 Medical Decision Making 32 year old female with past medical history of ADHD, asthma, and kidney stones presenting to the emergency department today with abdominal pain.  I have reviewed  her CT scan performed as an outpatient yesterday and it did show intussusception.  I will repeat labs here and obtain a repeat CT scan to evaluate for persistent intussusception or ureteral stones.  I will give the patient Dilaudid and Phenergan for her symptoms as well as IV fluids.  I will reevaluate for ultimate disposition.  The patient CT scan here today is unremarkable.  Urinalysis does show some leukocytes as well as bacteria.  The patient is on cefdinir currently.  A culture is added.  She is instructed to continue this.  She is ultimately discharged with GI follow-up with return precautions.  Amount and/or Complexity of Data Reviewed Labs: ordered. Radiology: ordered.  Risk Prescription drug management.           Final Clinical Impression(s) / ED Diagnoses Final diagnoses:  Abdominal pain, unspecified abdominal location    Rx / DC Orders ED Discharge Orders          Ordered    hyoscyamine (LEVSIN/SL) 0.125 MG SL tablet  Every 4 hours PRN        08/10/23 2203    promethazine (PHENERGAN) 25 MG tablet  Every 6 hours PRN        08/10/23 2203              Durwin Glaze, MD 08/10/23 2325

## 2023-08-10 NOTE — Discharge Instructions (Signed)
Your workup today was reassuring.  I did add on a urine culture to your workup.  If this is remarkable you should receive a call from one of our pharmacist.  Continue taking the cefdinir in the meantime.  Take the Levsin and Phenergan as needed.  Please call and schedule follow-up appoint with a GI doctor at the number provided regarding the intussusception seen on the CT scan yesterday.  Return to the ER for worsening symptoms.

## 2023-08-14 ENCOUNTER — Emergency Department (HOSPITAL_COMMUNITY): Payer: Medicaid Other

## 2023-08-14 ENCOUNTER — Other Ambulatory Visit: Payer: Self-pay

## 2023-08-14 ENCOUNTER — Encounter (HOSPITAL_COMMUNITY): Payer: Self-pay | Admitting: Emergency Medicine

## 2023-08-14 ENCOUNTER — Emergency Department (HOSPITAL_COMMUNITY)
Admission: EM | Admit: 2023-08-14 | Discharge: 2023-08-14 | Disposition: A | Discharge status: Home / Self Care | Payer: Medicaid Other | Attending: Emergency Medicine | Admitting: Emergency Medicine

## 2023-08-14 DIAGNOSIS — R1084 Generalized abdominal pain: Secondary | ICD-10-CM | POA: Diagnosis present

## 2023-08-14 DIAGNOSIS — K5900 Constipation, unspecified: Secondary | ICD-10-CM | POA: Diagnosis not present

## 2023-08-14 DIAGNOSIS — N3001 Acute cystitis with hematuria: Secondary | ICD-10-CM | POA: Diagnosis not present

## 2023-08-14 LAB — URINALYSIS, ROUTINE W REFLEX MICROSCOPIC
Bilirubin Urine: NEGATIVE
Glucose, UA: NEGATIVE mg/dL
Hgb urine dipstick: NEGATIVE
Ketones, ur: 5 mg/dL — AB
Nitrite: NEGATIVE
Protein, ur: NEGATIVE mg/dL
Specific Gravity, Urine: 1.02 (ref 1.005–1.030)
WBC, UA: >50 WBC/hpf (ref 0–5)
pH: 6 (ref 5.0–8.0)

## 2023-08-14 LAB — CBC
HCT: 40.2 % (ref 36.0–46.0)
Hemoglobin: 13.3 g/dL (ref 12.0–15.0)
MCH: 30.3 pg (ref 26.0–34.0)
MCHC: 33.1 g/dL (ref 30.0–36.0)
MCV: 91.6 fL (ref 80.0–100.0)
Platelets: 240 10*3/uL (ref 150–400)
RBC: 4.39 MIL/uL (ref 3.87–5.11)
RDW: 11.9 % (ref 11.5–15.5)
WBC: 7.3 10*3/uL (ref 4.0–10.5)
nRBC: 0 % (ref 0.0–0.2)

## 2023-08-14 LAB — COMPREHENSIVE METABOLIC PANEL
ALT: 8 U/L (ref 0–44)
AST: 21 U/L (ref 15–41)
Albumin: 4.1 g/dL (ref 3.5–5.0)
Alkaline Phosphatase: 36 U/L — ABNORMAL LOW (ref 38–126)
Anion gap: 12 (ref 5–15)
BUN: 10 mg/dL (ref 6–20)
CO2: 21 mmol/L — ABNORMAL LOW (ref 22–32)
Calcium: 8.8 mg/dL — ABNORMAL LOW (ref 8.9–10.3)
Chloride: 102 mmol/L (ref 98–111)
Creatinine, Ser: 1.02 mg/dL — ABNORMAL HIGH (ref 0.44–1.00)
GFR, Estimated: >60 mL/min (ref >60)
Glucose, Bld: 110 mg/dL — ABNORMAL HIGH (ref 70–99)
Potassium: 3.7 mmol/L (ref 3.5–5.1)
Sodium: 135 mmol/L (ref 135–145)
Total Bilirubin: 0.5 mg/dL (ref 0.3–1.2)
Total Protein: 7.1 g/dL (ref 6.5–8.1)

## 2023-08-14 LAB — HCG, SERUM, QUALITATIVE: Preg, Serum: NEGATIVE

## 2023-08-14 LAB — LIPASE, BLOOD: Lipase: 29 U/L (ref 11–51)

## 2023-08-14 MED ORDER — LACTATED RINGERS IV BOLUS
1000.0000 mL | Freq: Once | INTRAVENOUS | Status: AC
  Administered 2023-08-14: 1000 mL via INTRAVENOUS

## 2023-08-14 MED ORDER — METOCLOPRAMIDE HCL 5 MG/ML IJ SOLN
10.0000 mg | Freq: Once | INTRAMUSCULAR | Status: AC
  Administered 2023-08-14: 10 mg via INTRAVENOUS
  Filled 2023-08-14: qty 2

## 2023-08-14 MED ORDER — IOHEXOL 350 MG/ML SOLN
75.0000 mL | Freq: Once | INTRAVENOUS | Status: AC | PRN
  Administered 2023-08-14: 75 mL via INTRAVENOUS

## 2023-08-14 MED ORDER — CEPHALEXIN 500 MG PO CAPS
500.0000 mg | ORAL_CAPSULE | Freq: Two times a day (BID) | ORAL | 0 refills | Status: AC
Start: 2023-08-14 — End: 2023-08-21

## 2023-08-14 MED ORDER — DICYCLOMINE HCL 10 MG/ML IM SOLN
10.0000 mg | Freq: Once | INTRAMUSCULAR | Status: AC
  Administered 2023-08-14: 10 mg via INTRAMUSCULAR
  Filled 2023-08-14: qty 2

## 2023-08-14 MED ORDER — DICYCLOMINE HCL 20 MG PO TABS
20.0000 mg | ORAL_TABLET | Freq: Two times a day (BID) | ORAL | 0 refills | Status: DC
Start: 2023-08-14 — End: 2024-10-05

## 2023-08-14 MED ORDER — OXYCODONE-ACETAMINOPHEN 5-325 MG PO TABS
2.0000 | ORAL_TABLET | Freq: Once | ORAL | Status: AC
  Administered 2023-08-14: 2 via ORAL
  Filled 2023-08-14: qty 2

## 2023-08-14 MED ORDER — PROMETHAZINE HCL 25 MG PO TABS
25.0000 mg | ORAL_TABLET | ORAL | Status: AC
  Administered 2023-08-14: 25 mg via ORAL
  Filled 2023-08-14: qty 1

## 2023-08-14 MED ORDER — SODIUM CHLORIDE 0.9 % IV SOLN
1.0000 g | Freq: Once | INTRAVENOUS | Status: AC
  Administered 2023-08-14: 1 g via INTRAVENOUS
  Filled 2023-08-14: qty 10

## 2023-08-14 MED ORDER — DIPHENHYDRAMINE HCL 50 MG/ML IJ SOLN
25.0000 mg | Freq: Once | INTRAMUSCULAR | Status: AC
  Administered 2023-08-14: 25 mg via INTRAVENOUS
  Filled 2023-08-14: qty 1

## 2023-08-14 MED ORDER — DIPHENHYDRAMINE HCL 25 MG PO CAPS
25.0000 mg | ORAL_CAPSULE | Freq: Once | ORAL | Status: AC
  Administered 2023-08-14: 25 mg via ORAL
  Filled 2023-08-14: qty 1

## 2023-08-14 MED ORDER — HYDROMORPHONE HCL 1 MG/ML IJ SOLN
1.0000 mg | Freq: Once | INTRAMUSCULAR | Status: AC
  Administered 2023-08-14: 1 mg via INTRAVENOUS
  Filled 2023-08-14: qty 1

## 2023-08-14 NOTE — ED Triage Notes (Signed)
Pt presents via EMS for lower abd pain, 10/10, N/V, constipation   H/o liver tumor, intussusception diagnosed with week (had a CT with contrast), kidney stone 2 weeks ago, free fluid in abd in ct last week  Pt states her concern is increasing pain and feeling feverish

## 2023-08-14 NOTE — ED Provider Triage Note (Signed)
  Emergency Medicine Provider Triage Evaluation Note  MRN:  [id number]  Arrival date & time: 08/14/23    Medically screening exam initiated at 12:41 AM.   CC:   Abdominal pain  HPI:  Abigail Harding is a 32 y.o. year-old female presents to the ED with chief complaint of abdominal pain.  States that she has been seen several times for the same.  Referred to  GI.  She reports hx of introsusception.   History provided by patient. ROS:  -As included in HPI PE:   Vitals:   08/14/23 0022  BP: 118/75  Pulse: 90  Resp: 20  Temp: 98.6 F (37 C)  SpO2: 98%    Non-toxic appearing No respiratory distress Diffuse abdominal discomfort MDM:    Patient was informed that the remainder of the evaluation will be completed by another provider, this initial triage assessment does not replace that evaluation, and the importance of remaining in the ED until their evaluation is complete.    Roxy Horseman, PA-C 08/14/23 [id number]

## 2023-08-14 NOTE — ED Provider Notes (Signed)
[place] EMERGENCY DEPARTMENT AT [facility] Provider Note   CSN: [id number] Arrival date & time: 08/14/23  0020     History ADHD, Seizures Chief Complaint  Patient presents with   Abdominal Pain    Abigail Harding is a 32 y.o. female.  32 year old female with a past medical history of seizures, endometriosis, kidney stones presents to the ED with a chief complaint of generalized abdominal pain that is been ongoing for the past week and a half.  Reports being diagnosed with a liver mass, kidney stone, along with free fluid in her pelvis, reports worsening pain over the last couple of days.  Has not been taking any pain medication at home.  Was also told that she has a 2 mm kidney stone to the right side.  Having some urinary symptoms specially with some dysuria, tried treating UTI at home with Azos but no improvement in symptoms.  She is also endorsing some nausea and vomiting, nonbilious, nonbloody.  She also had a last bowel movement 2 days ago, reports it was first very mucousy, now it is more so hard. Endorsing fevers with a Tmax of 101, taking antipyretics for. No chest pain, cough or sob.  Of note, multiple prior surgeries to her abdomen including two exploratory labs.   The history is provided by the patient.       Home Medications Prior to Admission medications   Medication Sig Start Date End Date Taking? Authorizing Provider  cephALEXin (KEFLEX) 500 MG capsule Take 1 capsule (500 mg total) by mouth 2 (two) times daily for 7 days. 08/14/23 08/21/23 Yes Mrk Buzby, PA-C  dicyclomine (BENTYL) 20 MG tablet Take 1 tablet (20 mg total) by mouth 2 (two) times daily for 7 days. 08/14/23 08/21/23 Yes Daevion Navarette, PA-C  albuterol (VENTOLIN HFA) 108 (90 Base) MCG/ACT inhaler Inhale 2 puffs into the lungs daily as needed for shortness of breath. 05/15/21   [provider]  clonazePAM (KLONOPIN) 0.5 MG tablet Take 1 tablet (0.5 mg total) by mouth daily as needed  (anxiety). 02/22/22   Drema Dallas, MD  cyclobenzaprine (FLEXERIL) 5 MG tablet Take 1 tablet (5 mg total) by mouth 3 (three) times daily as needed for muscle spasms. Patient taking differently: Take 5 mg by mouth daily as needed for muscle spasms. 02/20/22   Fayrene Helper, PA-C  Drospirenone (SLYND) 4 MG TABS Take 1 tablet by mouth daily. Pt skips placebo pills and continues to next pack in order to skip period 09/30/22   Gwyneth Sprout, MD  fluticasone (FLONASE) 50 MCG/ACT nasal spray Place 1 spray into the nose daily as needed.    [provider]  hyoscyamine (LEVSIN/SL) 0.125 MG SL tablet Place 1 tablet (0.125 mg total) under the tongue every 4 (four) hours as needed. 08/10/23   Durwin Glaze, MD  lidocaine (LIDODERM) 5 % Place 1 patch onto the skin daily as needed (pain). Remove & Discard patch within 12 hours or as directed by MD    [provider]  metoCLOPramide (REGLAN) 10 MG tablet Take 1 tablet (10 mg total) by mouth every 6 (six) hours as needed for nausea or vomiting. 12/19/22   Alvira Monday, MD  oxyCODONE-acetaminophen (PERCOCET/ROXICET) 5-325 MG tablet Take 1 tablet by mouth every 6 (six) hours as needed for severe pain. 09/30/22   Gwyneth Sprout, MD  pantoprazole (PROTONIX) 40 MG tablet Take 1 tablet (40 mg total) by mouth daily for 14 days. 12/19/22 01/02/23  Alvira Monday, MD  Polyvinyl Alcohol-Povidone (REFRESH OP) Place 1 drop into both eyes 3 (three) times daily as needed (dry eyes).    [provider]  predniSONE (DELTASONE) 20 MG tablet 3 tabs po day one, then 2 tabs daily x 4 days 02/20/22   Fayrene Helper, PA-C  promethazine (PHENERGAN) 12.5 MG tablet Take 1 tablet (12.5 mg total) by mouth every 6 (six) hours as needed for nausea or vomiting. 09/30/22   Gwyneth Sprout, MD  promethazine (PHENERGAN) 25 MG tablet Take 1 tablet (25 mg total) by mouth every 6 (six) hours as needed for nausea or vomiting. 08/10/23   Durwin Glaze, MD  traZODone (DESYREL)  50 MG tablet Take 0.5 tablets (25 mg total) by mouth at bedtime as needed for sleep. 02/22/22   Drema Dallas, MD      Allergies    Hydrocodone, Ketorolac, Morphine and codeine, Tramadol, Venlafaxine, Betamethasone, Zofran [ondansetron], and Hydroxyzine    Review of Systems   Review of Systems  Constitutional:  Positive for chills and fever.  HENT:  Negative for sore throat.   Respiratory:  Negative for shortness of breath.   Cardiovascular:  Negative for chest pain.  Gastrointestinal:  Positive for abdominal pain, constipation, nausea and vomiting. Negative for blood in stool.  Genitourinary:  Negative for flank pain.  All other systems reviewed and are negative.   Physical Exam Updated Vital Signs BP 100/66   Pulse 75   Temp 98.5 F (36.9 C)   Resp 17   Wt 56 kg   LMP  (LMP Unknown)   SpO2 98%   BMI 21.19 kg/m  Physical Exam Vitals and nursing note reviewed.  Constitutional:      Appearance: Normal appearance.  HENT:     Head: Normocephalic and atraumatic.     Mouth/Throat:     Mouth: Mucous membranes are moist.  Cardiovascular:     Rate and Rhythm: Normal rate.  Pulmonary:     Effort: Pulmonary effort is normal.  Abdominal:     General: Abdomen is flat.     Tenderness: There is abdominal tenderness. There is right CVA tenderness and guarding. There is no left CVA tenderness or rebound.  Musculoskeletal:     Cervical back: Normal range of motion and neck supple.  Skin:    General: Skin is warm and dry.  Neurological:     Mental Status: She is alert and oriented to person, place, and time.     ED Results / Procedures / Treatments   Labs (all labs ordered are listed, but only abnormal results are displayed) Labs Reviewed  COMPREHENSIVE METABOLIC PANEL - Abnormal; Notable for the following components:      Result Value   CO2 21 (*)    Glucose, Bld 110 (*)    Creatinine, Ser 1.02 (*)    Calcium 8.8 (*)    Alkaline Phosphatase 36 (*)    All other  components within normal limits  URINALYSIS, ROUTINE W REFLEX MICROSCOPIC - Abnormal; Notable for the following components:   APPearance HAZY (*)    Ketones, ur 5 (*)    Leukocytes,Ua LARGE (*)    Bacteria, UA FEW (*)    All other components within normal limits  URINE CULTURE  LIPASE, BLOOD  CBC  HCG, SERUM, QUALITATIVE    EKG None  Radiology CT ABDOMEN PELVIS W CONTRAST  Addendum Date: 08/14/2023   ADDENDUM REPORT: 08/14/2023 08:35 ADDENDUM: An addendum has been requested for this examination. Exam and patient information have been  confirmed and are correct. Upon further review, there are postsurgical changes of appendectomy with hyperdense surgical material at the inferior aspect of the cecum. Previously identified tubular structure in the right lower quadrant corresponds to the normal anatomic round ligament. A remnant appendix is NOT seen. Electronically Signed   By: Agustin Cree M.D.   On: 08/14/2023 08:35   Result Date: 08/14/2023 CLINICAL DATA:  Acute abdominal pain EXAM: CT ABDOMEN AND PELVIS WITH CONTRAST TECHNIQUE: Multidetector CT imaging of the abdomen and pelvis was performed using the standard protocol following bolus administration of intravenous contrast. RADIATION DOSE REDUCTION: This exam was performed according to the departmental dose-optimization program which includes automated exposure control, adjustment of the mA and/or kV according to patient size and/or use of iterative reconstruction technique. CONTRAST:  75 mL Omnipaque 350. COMPARISON:  08/10/2023 FINDINGS: Lower chest: No acute abnormality. Hepatobiliary: No focal liver abnormality is seen. No gallstones, gallbladder wall thickening, or biliary dilatation. Pancreas: Unremarkable. No pancreatic ductal dilatation or surrounding inflammatory changes. Spleen: Normal in size without focal abnormality. Adrenals/Urinary Tract: Adrenal glands are within normal limits. Kidneys are well visualized bilaterally. Small 1-2 mm  stone is noted in the lower pole of the right kidney similar to that seen on prior exam. No obstructive changes are seen. The bladder is decompressed. Stomach/Bowel: No obstructive or inflammatory changes of the colon are seen. Scattered fecal material is noted throughout the colon consistent with a mild degree of constipation. The appendix is well visualized and within normal limits. Small bowel and stomach appear within normal limits. Vascular/Lymphatic: No significant vascular findings are present. No enlarged abdominal or pelvic lymph nodes. Reproductive: Uterus and bilateral adnexa are unremarkable. Other: No abdominal wall hernia or abnormality. No abdominopelvic ascites. Musculoskeletal: No acute or significant osseous findings. IMPRESSION: Nonobstructing right renal stone stable from the prior study. Mild constipation. No other focal abnormality is noted. Electronically Signed: By: Alcide Clever M.D. On: 08/14/2023 01:54    Procedures Procedures    Medications Ordered in ED Medications  cefTRIAXone (ROCEPHIN) 1 g in sodium chloride 0.9 % 100 mL IVPB (1 g Intravenous New Bag/Given 08/14/23 0929)  oxyCODONE-acetaminophen (PERCOCET/ROXICET) 5-325 MG per tablet 2 tablet (2 tablets Oral Given 08/14/23 0052)  promethazine (PHENERGAN) tablet 25 mg (25 mg Oral Given 08/14/23 0052)  iohexol (OMNIPAQUE) 350 MG/ML injection 75 mL (75 mLs Intravenous Contrast Given 08/14/23 0150)  diphenhydrAMINE (BENADRYL) capsule 25 mg (25 mg Oral Given 08/14/23 0312)  lactated ringers bolus 1,000 mL (0 mLs Intravenous Stopped 08/14/23 0929)  metoCLOPramide (REGLAN) injection 10 mg (10 mg Intravenous Given 08/14/23 0700)  HYDROmorphone (DILAUDID) injection 1 mg (1 mg Intravenous Given 08/14/23 0700)  diphenhydrAMINE (BENADRYL) injection 25 mg (25 mg Intravenous Given 08/14/23 0838)  dicyclomine (BENTYL) injection 10 mg (10 mg Intramuscular Given 08/14/23 4098)    ED Course/ Medical Decision Making/ A&P Clinical Course as of  08/14/23 0945  Tue Aug 14, 2023  0640 Leukocytes,Ua(!): LARGE [JS]  0640 WBC, UA: >50 [JS]    Clinical Course User Index [JS] Claude Manges, PA-C                                 Medical Decision Making Amount and/or Complexity of Data Reviewed Labs: ordered. Decision-making details documented in ED Course.  Risk Prescription drug management.   This patient presents to the ED for concern of abdominal pain, this involves a number of treatment options, and is a  complaint that carries with it a high risk of complications and morbidity.  The differential diagnosis includes bowel obstruction, recurrent intussusception versus renal colic.    Co morbidities: Discussed in HPI   Brief History:  See HPI.   EMR reviewed including pt PMHx, past surgical history and past visits to ER.   See HPI for more details   Lab Tests:  I ordered and independently interpreted labs.  The pertinent results include:    I personally reviewed all laboratory work and imaging. Metabolic panel without any acute abnormality specifically kidney function within normal limits and no significant electrolyte abnormalities. CBC without leukocytosis or significant anemia. Lipase level is normal, Hcg is negative. UA large leukocytes, >50 WBC, few bacteria will send for culture.   Imaging Studies:  CT Abdomen and pelvis showed: IMPRESSION:  Nonobstructing right renal stone stable from the prior study.    Mild constipation.    No other focal abnormality is noted.   Medicines ordered:  I ordered medication including Reglan, Dilaudid, Phenergan, bolus for symptomatic treatment  Reevaluation of the patient after these medicines showed that the patient improved I have reviewed the patients home medicines and have made adjustments as needed  Reevaluation:  After the interventions noted above I re-evaluated patient and found that they have :improved  Social Determinants of Health:  The patient's social  determinants of health were a factor in the care of this patient   Problem List / ED Course:  Patient here with prior history of endometriosis, significant abdominal pathology including 3X labs presents to the ED with a chief complaint of generalized abdominal pain, reports she was diagnosed with a liver mass, kidney stone, intussusception.  Prior history of intussusception in the past.  Reports pain has been exacerbated over the last few days.  She is also been treating a urinary tract infection at home with some Azo's.  Here her UA shows large leukocytes, greater than 50 white blood cell count along with few bacteria.  She is generally tender along the entire abdomen with diminished bowel sounds, reports her last bowel movement was approximately 2 days ago.  CBC with no leukocytosis, reports a fever at home but has been afebrile here.  CMP with slight increase in her creatinine, LFTs are within normal limits and no other electrolyte derangement.  Lipase level is negative, hCG is also negative.  CT showed mild level of constipation.  She has received narcotic pain medication prior to me evaluating patient.  Given medication to help with pain control along with Reglan and fluids for hydration and symptomatic treatment. Call placed to [facility] radiology, in order to addendum patient CT scan as she reports she had a appendectomy in the past.  She is not actively vomiting at this time, tolerated sips of water in order to take her BuSpar medication.  She was given 1 round of Dilaudid which she reports took the edge off but does not resolve the pain completely.  Explained to her that I likely cannot resolve the pain in its totality.  Patient does report that she has hydrocodone at home, I did advise her this is likely worsening her constipation at this time, we will try Bentyl to help with abdominal etiology. Patient also received 1 g of Rocephin while in the emergency department, she continues to tolerate p.o.  intake, we discussed appropriate follow-up with gastroenterology.  Hemodynamically stable for discharge.   Dispostion:  After consideration of the diagnostic results and the patients response to treatment, I  feel that the patent would benefit from outpatient follow-up with gastroenterology.  Portions of this note were generated with Scientist, clinical (histocompatibility and immunogenetics). Dictation errors may occur despite best attempts at proofreading.   Final Clinical Impression(s) / ED Diagnoses Final diagnoses:  Generalized abdominal pain  Acute cystitis with hematuria    Rx / DC Orders ED Discharge Orders          Ordered    cephALEXin (KEFLEX) 500 MG capsule  2 times daily        08/14/23 0849    dicyclomine (BENTYL) 20 MG tablet  2 times daily        08/14/23 0944              Claude Manges, PA-C 08/14/23 0945    Gwyneth Sprout, MD 08/18/23 [phone]

## 2023-08-14 NOTE — Discharge Instructions (Addendum)
You are given a prescription for Keflex in order to help treat your urinary tract infection.  Your urine will also be sent for culture, therefore if another specimen is grown, your antibiotic may be changed.  You may continue taking your pain medication that you are prescribed at home, however I do know that you will need to add a bowel regimen such as MiraLAX or Dulcolax to help with the constipation shown on your CT abdomen and pelvis.  You ultimately need an appointment with gastroenterology if this pain continues, please schedule an appointment with our specialist, therefore number is attached to your chart.

## 2023-08-15 LAB — URINE CULTURE: Culture: NO GROWTH

## 2023-11-15 ENCOUNTER — Emergency Department (HOSPITAL_COMMUNITY): Payer: Medicaid Other

## 2023-11-15 ENCOUNTER — Emergency Department (HOSPITAL_COMMUNITY)
Admission: EM | Admit: 2023-11-15 | Discharge: 2023-11-15 | Disposition: A | Discharge status: Home / Self Care | Payer: Medicaid Other | Attending: Emergency Medicine | Admitting: Emergency Medicine

## 2023-11-15 ENCOUNTER — Encounter (HOSPITAL_COMMUNITY): Payer: Self-pay

## 2023-11-15 DIAGNOSIS — R1084 Generalized abdominal pain: Secondary | ICD-10-CM | POA: Diagnosis not present

## 2023-11-15 DIAGNOSIS — Z79899 Other long term (current) drug therapy: Secondary | ICD-10-CM | POA: Insufficient documentation

## 2023-11-15 DIAGNOSIS — R140 Abdominal distension (gaseous): Secondary | ICD-10-CM | POA: Diagnosis present

## 2023-11-15 DIAGNOSIS — R112 Nausea with vomiting, unspecified: Secondary | ICD-10-CM | POA: Insufficient documentation

## 2023-11-15 HISTORY — DX: Acquired absence of other specified parts of digestive tract: Z90.49

## 2023-11-15 HISTORY — DX: Bipolar disorder, unspecified: F31.9

## 2023-11-15 HISTORY — DX: Other specified postprocedural states: Z98.890

## 2023-11-15 LAB — CBC WITH DIFFERENTIAL/PLATELET
Abs Immature Granulocytes: 0.02 10*3/uL (ref 0.00–0.07)
Basophils Absolute: 0 10*3/uL (ref 0.0–0.1)
Basophils Relative: 0 %
Eosinophils Absolute: 0.1 10*3/uL (ref 0.0–0.5)
Eosinophils Relative: 1 %
HCT: 40.2 % (ref 36.0–46.0)
Hemoglobin: 13.2 g/dL (ref 12.0–15.0)
Immature Granulocytes: 0 %
Lymphocytes Relative: 10 %
Lymphs Abs: 0.8 10*3/uL (ref 0.7–4.0)
MCH: 30.1 pg (ref 26.0–34.0)
MCHC: 32.8 g/dL (ref 30.0–36.0)
MCV: 91.6 fL (ref 80.0–100.0)
Monocytes Absolute: 0.6 10*3/uL (ref 0.1–1.0)
Monocytes Relative: 8 %
Neutro Abs: 5.9 10*3/uL (ref 1.7–7.7)
Neutrophils Relative %: 81 %
Platelets: 189 10*3/uL (ref 150–400)
RBC: 4.39 MIL/uL (ref 3.87–5.11)
RDW: 11.8 % (ref 11.5–15.5)
WBC: 7.3 10*3/uL (ref 4.0–10.5)
nRBC: 0 % (ref 0.0–0.2)

## 2023-11-15 LAB — COMPREHENSIVE METABOLIC PANEL
ALT: 9 U/L (ref 0–44)
AST: 19 U/L (ref 15–41)
Albumin: 4.1 g/dL (ref 3.5–5.0)
Alkaline Phosphatase: 40 U/L (ref 38–126)
Anion gap: 10 (ref 5–15)
BUN: 8 mg/dL (ref 6–20)
CO2: 24 mmol/L (ref 22–32)
Calcium: 9 mg/dL (ref 8.9–10.3)
Chloride: 104 mmol/L (ref 98–111)
Creatinine, Ser: 0.78 mg/dL (ref 0.44–1.00)
GFR, Estimated: >60 mL/min (ref >60)
Glucose, Bld: 104 mg/dL — ABNORMAL HIGH (ref 70–99)
Potassium: 4.5 mmol/L (ref 3.5–5.1)
Sodium: 138 mmol/L (ref 135–145)
Total Bilirubin: 0.7 mg/dL (ref 0.0–1.2)
Total Protein: 6.7 g/dL (ref 6.5–8.1)

## 2023-11-15 LAB — I-STAT CHEM 8, ED
BUN: 9 mg/dL (ref 6–20)
Calcium, Ion: 1.12 mmol/L — ABNORMAL LOW (ref 1.15–1.40)
Chloride: 104 mmol/L (ref 98–111)
Creatinine, Ser: 0.8 mg/dL (ref 0.44–1.00)
Glucose, Bld: 99 mg/dL (ref 70–99)
HCT: 40 % (ref 36.0–46.0)
Hemoglobin: 13.6 g/dL (ref 12.0–15.0)
Potassium: 4.4 mmol/L (ref 3.5–5.1)
Sodium: 139 mmol/L (ref 135–145)
TCO2: 25 mmol/L (ref 22–32)

## 2023-11-15 LAB — HCG, SERUM, QUALITATIVE: Preg, Serum: NEGATIVE

## 2023-11-15 LAB — LIPASE, BLOOD: Lipase: 28 U/L (ref 11–51)

## 2023-11-15 MED ORDER — OXYCODONE-ACETAMINOPHEN 5-325 MG PO TABS: 1.0000 | ORAL_TABLET | Freq: Four times a day (QID) | ORAL | 0 refills | Status: DC | PRN

## 2023-11-15 MED ORDER — SODIUM CHLORIDE 0.9 % IV SOLN
12.5000 mg | Freq: Four times a day (QID) | INTRAVENOUS | Status: DC | PRN
  Administered 2023-11-15: 12.5 mg via INTRAVENOUS
  Filled 2023-11-15: qty 0.5
  Filled 2023-11-15: qty 12.5

## 2023-11-15 MED ORDER — IOHEXOL 350 MG/ML SOLN
60.0000 mL | Freq: Once | INTRAVENOUS | Status: AC | PRN
  Administered 2023-11-15: 60 mL via INTRAVENOUS

## 2023-11-15 MED ORDER — PROMETHAZINE HCL 25 MG PO TABS: 25.0000 mg | ORAL_TABLET | Freq: Four times a day (QID) | ORAL | 0 refills | Status: DC | PRN

## 2023-11-15 MED ORDER — SODIUM CHLORIDE 0.9 % IV SOLN
12.5000 mg | INTRAVENOUS | Status: AC
  Administered 2023-11-15: 12.5 mg via INTRAVENOUS
  Filled 2023-11-15: qty 12.5

## 2023-11-15 MED ORDER — HYDROMORPHONE HCL 1 MG/ML IJ SOLN
1.0000 mg | Freq: Once | INTRAMUSCULAR | Status: AC
  Administered 2023-11-15: 1 mg via INTRAVENOUS
  Filled 2023-11-15: qty 1

## 2023-11-15 MED ORDER — DIPHENHYDRAMINE HCL 50 MG/ML IJ SOLN
25.0000 mg | Freq: Once | INTRAMUSCULAR | Status: AC
  Administered 2023-11-15: 25 mg via INTRAVENOUS
  Filled 2023-11-15: qty 1

## 2023-11-15 MED ORDER — FENTANYL CITRATE PF 50 MCG/ML IJ SOSY
50.0000 ug | PREFILLED_SYRINGE | Freq: Once | INTRAMUSCULAR | Status: AC
  Administered 2023-11-15: 50 ug via INTRAVENOUS
  Filled 2023-11-15: qty 1

## 2023-11-15 NOTE — Discharge Instructions (Addendum)
 As we discussed I do not find anything on your CT scan that resembles a capsule endoscopy.  You need to talk to your GI doctor and continue Phenergan  as prescribed.  If there is persistent concern, I recommend a colonoscopy to look for the endoscopy capsule  Return to ER if you have persistent abdominal pain or vomiting or dehydration

## 2023-11-15 NOTE — ED Notes (Signed)
 Called CT to f/u on CT result, not yet posted. CT tech will call to confirm that scan is being reviewed by radiology.

## 2023-11-15 NOTE — ED Notes (Signed)
 Messaged EDP to inform that pt is requesting additional pain medication prior to CT. IV remains in place. She notes that the initial dose of fentanyl wore off very quickly. Dr. Lynelle Doctor notified.

## 2023-11-15 NOTE — ED Provider Triage Note (Signed)
 Emergency Medicine Provider Triage Evaluation Note  Abigail Harding , a 33 y.o. female  was evaluated in triage.  Pt complains of abdominal pain.  Patient feels that her camera endoscopy pill is lodged in her abdomen.  Review of Systems  Positive: Vomiting abdominal pain Negative: Fever  Physical Exam  BP 114/70 (BP Location: Right Arm)   Pulse 87   Temp 97.8 F (36.6 C) (Oral)   Resp 15   Ht 1.626 m (5' 4)   Wt 56 kg   SpO2 96%   BMI 21.19 kg/m  Gen:   Awake,  Resp:  Normal effort  MSK:   Moves extremities without difficulty  Other:  Abdomen tender  Medical Decision Making  Medically screening exam initiated at 10:07 AM.  Appropriate orders placed.  Abigail Harding was informed that the remainder of the evaluation will be completed by another provider, this initial triage assessment does not replace that evaluation, and the importance of remaining in the ED until their evaluation is complete.  Patient has history of chronic abdominal pain.  She has been evaluated by gastroenterology affiliated with [facility].  Patient had a pill endoscopy procedure initiated on December 30.  Patient states she has not passed the pill and is having abdominal pain.  Will proceed with labs and imaging   Abigail Simmonds, MD 11/15/23 1011

## 2023-11-15 NOTE — ED Notes (Signed)
 Patient transported to CT

## 2023-11-15 NOTE — ED Provider Notes (Signed)
 [place] [facility] AT [facility] Provider Note   CSN: [id number] Arrival date & time: 11/15/23  1001     History  Chief Complaint  Patient presents with   Foreign Body    Abigail Harding is a 33 y.o. female history of chronic abdominal pain here presenting with abdominal distention and vomiting.  Patient states that she had pill endoscopy procedure done on December 30.  She states that she swallowed the pill and it should come out in 48 hours.  However she has not seen the pill.  She started having nausea vomiting.  She called her GI doctor was sent here for CT scan to rule out obstruction.  Patient has a history of chronic abdominal pain.  Patient recently had an endoscopy that did not find a cause of her abdominal pain.   The history is provided by the patient.       Home Medications Prior to Admission medications   Medication Sig Start Date End Date Taking? Authorizing Provider  busPIRone  (BUSPAR ) 7.5 MG tablet Take 7.5 mg by mouth 3 (three) times daily. 10/30/23  Yes [provider]  DONNATAL 16.2 MG tablet Take 1 tablet by mouth 3 (three) times daily as needed. 08/16/23  Yes [provider]  INDERAL LA 80 MG 24 hr capsule Take 80 mg by mouth daily. 08/16/23  Yes [provider]  QUEtiapine  (SEROQUEL  XR) 50 MG TB24 24 hr tablet Take 50 mg by mouth at bedtime. 10/26/23  Yes [provider]  albuterol (VENTOLIN HFA) 108 (90 Base) MCG/ACT inhaler Inhale 2 puffs into the lungs daily as needed for shortness of breath. 05/15/21   [provider]  clonazePAM  (KLONOPIN ) 0.5 MG tablet Take 1 tablet (0.5 mg total) by mouth daily as needed (anxiety). 02/22/22   Milissa Tod PARAS, MD  cyclobenzaprine  (FLEXERIL ) 5 MG tablet Take 1 tablet (5 mg total) by mouth 3 (three) times daily as needed for muscle spasms. Patient taking differently: Take 5 mg by mouth daily as needed for muscle spasms. 02/20/22   Nivia Colon, PA-C  dicyclomine   (BENTYL ) 20 MG tablet Take 1 tablet (20 mg total) by mouth 2 (two) times daily for 7 days. 08/14/23 08/21/23  Soto, Johana, PA-C  Drospirenone  (SLYND ) 4 MG TABS Take 1 tablet by mouth daily. Pt skips placebo pills and continues to next pack in order to skip period 09/30/22   Doretha Folks, MD  FLUoxetine  (PROZAC ) 20 MG capsule Take 20 mg by mouth daily.    [provider]  fluticasone  (FLONASE ) 50 MCG/ACT nasal spray Place 1 spray into the nose daily as needed.    [provider]  hyoscyamine  (LEVSIN AMIEL) 0.125 MG SL tablet Place 1 tablet (0.125 mg total) under the tongue every 4 (four) hours as needed. 08/10/23   Ula Prentice SAUNDERS, MD  lidocaine  (LIDODERM ) 5 % Place 1 patch onto the skin daily as needed (pain). Remove & Discard patch within 12 hours or as directed by MD    [provider]  metoCLOPramide  (REGLAN ) 10 MG tablet Take 1 tablet (10 mg total) by mouth every 6 (six) hours as needed for nausea or vomiting. 12/19/22   Dreama Longs, MD  Na Sulfate-K Sulfate-Mg Sulf 17.5-3.13-1.6 GM/177ML SOLN Take 177 mLs by mouth. 11/05/23   [provider]  oxyCODONE -acetaminophen  (PERCOCET/ROXICET) 5-325 MG tablet Take 1 tablet by mouth every 6 (six) hours as needed for severe pain. 09/30/22   Doretha Folks, MD  pantoprazole  (PROTONIX ) 40 MG  tablet Take 1 tablet (40 mg total) by mouth daily for 14 days. 12/19/22 01/02/23  Dreama Longs, MD  Polyvinyl Alcohol -Povidone (REFRESH OP) Place 1 drop into both eyes 3 (three) times daily as needed (dry eyes).    [provider]  predniSONE  (DELTASONE ) 20 MG tablet 3 tabs po day one, then 2 tabs daily x 4 days 02/20/22   Nivia Colon, PA-C  promethazine  (PHENERGAN ) 12.5 MG tablet Take 1 tablet (12.5 mg total) by mouth every 6 (six) hours as needed for nausea or vomiting. 09/30/22   Doretha Folks, MD  promethazine  (PHENERGAN ) 25 MG tablet Take 1 tablet (25 mg total) by mouth every 6 (six) hours as needed for nausea or  vomiting. 08/10/23   Ula Prentice SAUNDERS, MD  promethazine -dextromethorphan  (PROMETHAZINE -DM) 6.25-15 MG/5ML syrup Take 5 mLs by mouth 4 (four) times daily as needed for cough. 09/04/23   [provider]  rizatriptan (MAXALT-MLT) 10 MG disintegrating tablet Take 10 mg by mouth as needed for migraine. 10/30/23   [provider]  tamsulosin (FLOMAX) 0.4 MG CAPS capsule Take 0.4 mg by mouth every evening. 08/13/23   [provider]  tiZANidine (ZANAFLEX) 2 MG tablet Take 4 mg by mouth every 8 (eight) hours as needed.    [provider]  traZODone  (DESYREL ) 100 MG tablet Take 100 mg by mouth at bedtime as needed.    [provider]  traZODone  (DESYREL ) 50 MG tablet Take 0.5 tablets (25 mg total) by mouth at bedtime as needed for sleep. 02/22/22   Milissa Tod PARAS, MD      Allergies    Hydrocodone , Ketorolac , Morphine and codeine, Tramadol, Venlafaxine , Betamethasone, Zofran  [ondansetron ], and Hydroxyzine     Review of Systems   Review of Systems  Gastrointestinal:  Positive for abdominal pain and vomiting.  All other systems reviewed and are negative.   Physical Exam Updated Vital Signs BP 109/75 (BP Location: Left Arm)   Pulse 77   Temp 98.2 F (36.8 C) (Oral)   Resp 18   Ht 5' 4 (1.626 m)   Wt 56 kg   SpO2 100%   BMI 21.19 kg/m  Physical Exam Vitals and nursing note reviewed.  Constitutional:      Comments: Tearful and crying   HENT:     Head: Normocephalic.     Nose: Nose normal.     Mouth/Throat:     Mouth: Mucous membranes are dry.  Eyes:     Extraocular Movements: Extraocular movements intact.     Pupils: Pupils are equal, round, and reactive to light.  Cardiovascular:     Rate and Rhythm: Normal rate and regular rhythm.     Pulses: Normal pulses.     Heart sounds: Normal heart sounds.  Pulmonary:     Effort: Pulmonary effort is normal.     Breath sounds: Normal breath sounds.  Abdominal:     General: Abdomen is flat.      Palpations: Abdomen is soft.     Comments: Mild lower abdominal tenderness  Musculoskeletal:        General: Normal range of motion.     Cervical back: Normal range of motion and neck supple.  Skin:    General: Skin is warm.     Capillary Refill: Capillary refill takes less than 2 seconds.  Neurological:     General: No focal deficit present.  Psychiatric:     Comments: Crying      ED Results / Procedures / Treatments   Labs (  all labs ordered are listed, but only abnormal results are displayed) Labs Reviewed  COMPREHENSIVE METABOLIC PANEL - Abnormal; Notable for the following components:      Result Value   Glucose, Bld 104 (*)    All other components within normal limits  I-STAT CHEM 8, ED - Abnormal; Notable for the following components:   Calcium, Ion 1.12 (*)    All other components within normal limits  LIPASE, BLOOD  CBC WITH DIFFERENTIAL/PLATELET  HCG, SERUM, QUALITATIVE  URINALYSIS, ROUTINE W REFLEX MICROSCOPIC    EKG None  Radiology CT ABDOMEN PELVIS W CONTRAST Result Date: 11/15/2023 CLINICAL DATA:  Bowel obstruction suspected pt ingested a capsule endoscopy on 12/30. Has not felt it pass now with pain and vomiting. EXAM: CT ABDOMEN AND PELVIS WITH CONTRAST TECHNIQUE: Multidetector CT imaging of the abdomen and pelvis was performed using the standard protocol following bolus administration of intravenous contrast. RADIATION DOSE REDUCTION: This exam was performed according to the departmental dose-optimization program which includes automated exposure control, adjustment of the mA and/or kV according to patient size and/or use of iterative reconstruction technique. CONTRAST:  60mL OMNIPAQUE  IOHEXOL  350 MG/ML SOLN COMPARISON:  CT scan abdomen and pelvis from 08/14/2023. FINDINGS: Lower chest: The lung bases are clear. No pleural effusion. The heart is normal in size. No pericardial effusion. Hepatobiliary: The liver is normal in size. Non-cirrhotic configuration. No  suspicious mass. No intrahepatic or extrahepatic bile duct dilation. No calcified gallstones. Normal gallbladder wall thickness. No pericholecystic inflammatory changes. Pancreas: Unremarkable. No pancreatic ductal dilatation or surrounding inflammatory changes. Spleen: Within normal limits. No focal lesion. Adrenals/Urinary Tract: Adrenal glands are unremarkable. No suspicious renal mass. No hydronephrosis. There is a 2-3 mm nonobstructing calculus in the right kidney lower pole. No other renal or ureteric calculi. Unremarkable urinary bladder. Stomach/Bowel: No disproportionate dilation of the small or large bowel loops. No evidence of abnormal bowel wall thickening or inflammatory changes. The appendix is surgically absent. No radiopaque capsule endoscope noted. Vascular/Lymphatic: No ascites or pneumoperitoneum. No abdominal or pelvic lymphadenopathy, by size criteria. No aneurysmal dilation of the major abdominal arteries. Reproductive: The uterus is unremarkable. The ovaries are unremarkable. Other: The visualized soft tissues and abdominal wall are unremarkable. Musculoskeletal: No suspicious osseous lesions. IMPRESSION: *No acute inflammatory process identified within the abdomen or pelvis. No bowel obstruction. *Other nonacute observations, as described above. Electronically Signed   By: Ree Molt M.D.   On: 11/15/2023 15:22    Procedures Procedures    Medications Ordered in ED Medications  HYDROmorphone  (DILAUDID ) injection 1 mg (has no administration in time range)  promethazine  (PHENERGAN ) 12.5 mg in sodium chloride  0.9 % 50 mL IVPB (has no administration in time range)  fentaNYL  (SUBLIMAZE ) injection 50 mcg (50 mcg Intravenous Given 11/15/23 1023)  iohexol  (OMNIPAQUE ) 350 MG/ML injection 60 mL (60 mLs Intravenous Contrast Given 11/15/23 1245)    ED Course/ Medical Decision Making/ A&P                                 Medical Decision Making Abigail Harding is a 33 y.o. female here  presenting with abdominal pain and concern for possible obstruction from inability to pass the capsule endoscopy.  Plan to get CBC and CMP and CT abdomen pelvis  5:16 PM Reviewed patient's labs and independently reviewed CT scan.  I do not see any obvious foreign body or capsule endoscopy on the CT scan.  Since  capsule endoscopy usually has electronics, it would show up on the CT scan.  I do not see anything that resembles that.  I told her that she can follow-up with her GI doctor    Problems Addressed: Generalized abdominal pain: chronic illness or injury  Amount and/or Complexity of Data Reviewed Labs: ordered. Decision-making details documented in ED Course. Radiology: ordered and independent interpretation performed. Decision-making details documented in ED Course.  Risk Prescription drug management.    Final Clinical Impression(s) / ED Diagnoses Final diagnoses:  None    Rx / DC Orders ED Discharge Orders     None         Patt Alm Macho, MD 11/15/23 1718

## 2023-11-15 NOTE — ED Triage Notes (Signed)
 Dec 30, pt had an endoscopy with a pill camera. Pt swallowed the camera and is supposed to defecate the camera. Sent here for possible bowel obstruction as pt still has the camera in her abdomen.

## 2023-11-15 NOTE — ED Notes (Signed)
 Pt is in tears reporting 10/10 pain. EDP notified.

## 2024-01-11 ENCOUNTER — Encounter (HOSPITAL_COMMUNITY): Payer: Self-pay | Admitting: Emergency Medicine

## 2024-01-11 ENCOUNTER — Emergency Department (HOSPITAL_COMMUNITY): Payer: Medicaid Other

## 2024-01-11 ENCOUNTER — Emergency Department (HOSPITAL_COMMUNITY)
Admission: EM | Admit: 2024-01-11 | Discharge: 2024-01-11 | Disposition: A | Discharge status: Home / Self Care | Payer: Medicaid Other | Attending: Emergency Medicine | Admitting: Emergency Medicine

## 2024-01-11 ENCOUNTER — Other Ambulatory Visit: Payer: Self-pay

## 2024-01-11 DIAGNOSIS — J020 Streptococcal pharyngitis: Secondary | ICD-10-CM | POA: Diagnosis not present

## 2024-01-11 DIAGNOSIS — M545 Low back pain, unspecified: Secondary | ICD-10-CM | POA: Diagnosis not present

## 2024-01-11 DIAGNOSIS — J029 Acute pharyngitis, unspecified: Secondary | ICD-10-CM | POA: Diagnosis present

## 2024-01-11 LAB — RESP PANEL BY RT-PCR (RSV, FLU A&B, COVID)  RVPGX2
Influenza A by PCR: NEGATIVE
Influenza B by PCR: NEGATIVE
Resp Syncytial Virus by PCR: NEGATIVE
SARS Coronavirus 2 by RT PCR: NEGATIVE

## 2024-01-11 LAB — HCG, SERUM, QUALITATIVE: Preg, Serum: NEGATIVE

## 2024-01-11 LAB — GROUP A STREP BY PCR: Group A Strep by PCR: DETECTED — AB

## 2024-01-11 MED ORDER — DEXAMETHASONE SODIUM PHOSPHATE 10 MG/ML IJ SOLN
10.0000 mg | Freq: Once | INTRAMUSCULAR | Status: AC
  Administered 2024-01-11: 10 mg via INTRAVENOUS
  Filled 2024-01-11: qty 1

## 2024-01-11 MED ORDER — HYDROMORPHONE HCL 1 MG/ML IJ SOLN
0.5000 mg | Freq: Once | INTRAMUSCULAR | Status: AC
  Administered 2024-01-11: 0.5 mg via INTRAVENOUS
  Filled 2024-01-11: qty 1

## 2024-01-11 MED ORDER — SODIUM CHLORIDE 0.9 % IV SOLN
12.5000 mg | Freq: Once | INTRAVENOUS | Status: AC
  Administered 2024-01-11: 12.5 mg via INTRAVENOUS
  Filled 2024-01-11: qty 0.5

## 2024-01-11 MED ORDER — PENICILLIN G BENZATHINE 1200000 UNIT/2ML IM SUSY
1.2000 10*6.[IU] | PREFILLED_SYRINGE | Freq: Once | INTRAMUSCULAR | Status: AC
  Administered 2024-01-11: 1.2 10*6.[IU] via INTRAMUSCULAR
  Filled 2024-01-11: qty 2

## 2024-01-11 MED ORDER — OXYCODONE-ACETAMINOPHEN 5-325 MG PO TABS
1.0000 | ORAL_TABLET | Freq: Once | ORAL | Status: AC
  Administered 2024-01-11: 1 via ORAL
  Filled 2024-01-11: qty 1

## 2024-01-11 MED ORDER — OXYCODONE-ACETAMINOPHEN 5-325 MG PO TABS: 1.0000 | ORAL_TABLET | Freq: Four times a day (QID) | ORAL | 0 refills | Status: DC | PRN

## 2024-01-11 MED ORDER — METOCLOPRAMIDE HCL 5 MG/ML IJ SOLN
10.0000 mg | Freq: Once | INTRAMUSCULAR | Status: AC
  Administered 2024-01-11: 10 mg via INTRAVENOUS
  Filled 2024-01-11: qty 2

## 2024-01-11 MED ORDER — LIDOCAINE 5 % EX PTCH
1.0000 | MEDICATED_PATCH | CUTANEOUS | Status: DC
  Administered 2024-01-11: 1 via TRANSDERMAL
  Filled 2024-01-11: qty 1

## 2024-01-11 MED ORDER — LACTATED RINGERS IV BOLUS
1000.0000 mL | Freq: Once | INTRAVENOUS | Status: AC
  Administered 2024-01-11: 1000 mL via INTRAVENOUS

## 2024-01-11 MED ORDER — PROMETHAZINE HCL 25 MG PO TABS: 25.0000 mg | ORAL_TABLET | Freq: Four times a day (QID) | ORAL | 0 refills | Status: AC | PRN

## 2024-01-11 MED ORDER — LIDOCAINE VISCOUS HCL 2 % MT SOLN: 5.0000 mL | Freq: Four times a day (QID) | OROMUCOSAL | 0 refills | Status: DC | PRN

## 2024-01-11 MED ORDER — LIDOCAINE VISCOUS HCL 2 % MT SOLN
15.0000 mL | Freq: Once | OROMUCOSAL | Status: AC
  Administered 2024-01-11: 15 mL via OROMUCOSAL
  Filled 2024-01-11: qty 15

## 2024-01-11 NOTE — ED Provider Triage Note (Signed)
 Emergency Medicine Provider Triage Evaluation Note  Abigail Harding , a 33 y.o. female  was evaluated in triage.  Pt complains of sore throat, nausea, cough, and back pain.  Family member sick with 5th disease.  Also complains of low back pain.  Review of Systems  Positive: Sore throat, cough Negative:   Physical Exam  BP 122/72   Pulse 80   Temp 98.1 F (36.7 C) (Oral)   Resp 18   Wt 56 kg   LMP  (LMP Unknown)   SpO2 98%   BMI 21.19 kg/m  Gen:   Awake, no distress   Resp:  Normal effort  MSK:   Moves extremities without difficulty  Other:  Mild erythema of the oropharynx  Medical Decision Making  Medically screening exam initiated at 3:53 AM.  Appropriate orders placed.  Abigail Harding was informed that the remainder of the evaluation will be completed by another provider, this initial triage assessment does not replace that evaluation, and the importance of remaining in the ED until their evaluation is complete.     Roxy Horseman, PA-C 01/11/24 [phone]

## 2024-01-11 NOTE — ED Notes (Signed)
 Pt requesting more nausea medication and a lidocaine patch. MD made aware.

## 2024-01-11 NOTE — ED Provider Notes (Signed)
 [place] EMERGENCY DEPARTMENT AT [facility] Provider Note   CSN: [id number] Arrival date & time: 01/11/24  7846     History  Chief Complaint  Patient presents with   Sore Throat   Nausea    Abigail Harding is a 33 y.o. female.  Patient is a 33 year old female with a history of chronic pain due to multiple back surgeries who is not on chronic medications who presents with 3-day history of worsening sore throat, trouble swallowing and feeling like her throat is swollen.  She has also had lymph node swelling and pain.  No fever that she is aware of.  She reports in the setting of this she is getting worsening pain in her low back which is more chronic that she can usually deal with but now is becoming more severe since the sore throat has started.  Having a hard time eating and drinking because of the severity of the sore throat.  She denies any rashes.  No urinary symptoms.  The history is provided by the patient.  Sore Throat       Home Medications Prior to Admission medications   Medication Sig Start Date End Date Taking? Authorizing Provider  magic mouthwash (lidocaine, diphenhydrAMINE, alum & mag hydroxide) suspension Swish and swallow 5 mLs 4 (four) times daily as needed for mouth pain. 01/11/24  Yes Gwyneth Sprout, MD  oxyCODONE-acetaminophen (PERCOCET/ROXICET) 5-325 MG tablet Take 1 tablet by mouth every 6 (six) hours as needed for severe pain (pain score 7-10). 01/11/24  Yes Nadina Fomby, Alphonzo Lemmings, MD  albuterol (VENTOLIN HFA) 108 (90 Base) MCG/ACT inhaler Inhale 2 puffs into the lungs daily as needed for shortness of breath. 05/15/21   [provider]  busPIRone (BUSPAR) 7.5 MG tablet Take 7.5 mg by mouth 3 (three) times daily. 10/30/23   [provider]  clonazePAM (KLONOPIN) 0.5 MG tablet Take 1 tablet (0.5 mg total) by mouth daily as needed (anxiety). 02/22/22   Drema Dallas, MD  cyclobenzaprine (FLEXERIL) 5 MG tablet Take 1 tablet (5 mg total)  by mouth 3 (three) times daily as needed for muscle spasms. Patient taking differently: Take 5 mg by mouth daily as needed for muscle spasms. 02/20/22   Fayrene Helper, PA-C  dicyclomine (BENTYL) 20 MG tablet Take 1 tablet (20 mg total) by mouth 2 (two) times daily for 7 days. 08/14/23 08/21/23  Soto, Leonie Douglas, PA-C  DONNATAL 16.2 MG tablet Take 1 tablet by mouth 3 (three) times daily as needed. 08/16/23   [provider]  Drospirenone (SLYND) 4 MG TABS Take 1 tablet by mouth daily. Pt skips placebo pills and continues to next pack in order to skip period 09/30/22   Gwyneth Sprout, MD  FLUoxetine (PROZAC) 20 MG capsule Take 20 mg by mouth daily.    [provider]  fluticasone (FLONASE) 50 MCG/ACT nasal spray Place 1 spray into the nose daily as needed.    [provider]  hyoscyamine (LEVSIN/SL) 0.125 MG SL tablet Place 1 tablet (0.125 mg total) under the tongue every 4 (four) hours as needed. 08/10/23   Durwin Glaze, MD  INDERAL LA 80 MG 24 hr capsule Take 80 mg by mouth daily. 08/16/23   [provider]  lidocaine (LIDODERM) 5 % Place 1 patch onto the skin daily as needed (pain). Remove & Discard patch within 12 hours or as directed by MD    [provider]  metoCLOPramide (REGLAN) 10 MG tablet Take 1 tablet (10 mg total)  by mouth every 6 (six) hours as needed for nausea or vomiting. 12/19/22   Alvira Monday, MD  Na Sulfate-K Sulfate-Mg Sulf 17.5-3.13-1.6 GM/177ML SOLN Take 177 mLs by mouth. 11/05/23   [provider]  pantoprazole (PROTONIX) 40 MG tablet Take 1 tablet (40 mg total) by mouth daily for 14 days. 12/19/22 01/02/23  Alvira Monday, MD  Polyvinyl Alcohol-Povidone (REFRESH OP) Place 1 drop into both eyes 3 (three) times daily as needed (dry eyes).    [provider]  predniSONE (DELTASONE) 20 MG tablet 3 tabs po day one, then 2 tabs daily x 4 days 02/20/22   Fayrene Helper, PA-C  promethazine (PHENERGAN) 25 MG tablet Take 1 tablet (25  mg total) by mouth every 6 (six) hours as needed for nausea or vomiting. 01/11/24   Gwyneth Sprout, MD  promethazine-dextromethorphan (PROMETHAZINE-DM) 6.25-15 MG/5ML syrup Take 5 mLs by mouth 4 (four) times daily as needed for cough. 09/04/23   [provider]  QUEtiapine (SEROQUEL XR) 50 MG TB24 24 hr tablet Take 50 mg by mouth at bedtime. 10/26/23   [provider]  rizatriptan (MAXALT-MLT) 10 MG disintegrating tablet Take 10 mg by mouth as needed for migraine. 10/30/23   [provider]  tamsulosin (FLOMAX) 0.4 MG CAPS capsule Take 0.4 mg by mouth every evening. 08/13/23   [provider]  tiZANidine (ZANAFLEX) 2 MG tablet Take 4 mg by mouth every 8 (eight) hours as needed.    [provider]  traZODone (DESYREL) 100 MG tablet Take 100 mg by mouth at bedtime as needed.    [provider]  traZODone (DESYREL) 50 MG tablet Take 0.5 tablets (25 mg total) by mouth at bedtime as needed for sleep. 02/22/22   Drema Dallas, MD      Allergies    Hydrocodone, Ketorolac, Morphine and codeine, Tramadol, Venlafaxine, Betamethasone, Zofran [ondansetron], and Hydroxyzine    Review of Systems   Review of Systems  Physical Exam Updated Vital Signs BP 110/79 (BP Location: Left Arm)   Pulse 89   Temp 97.9 F (36.6 C) (Oral)   Resp 16   Wt 56 kg   LMP  (LMP Unknown)   SpO2 99%   BMI 21.19 kg/m  Physical Exam Vitals and nursing note reviewed.  Constitutional:      General: She is not in acute distress.    Appearance: She is well-developed.  HENT:     Head: Normocephalic and atraumatic.     Mouth/Throat:     Mouth: Mucous membranes are dry.     Pharynx: Pharyngeal swelling, oropharyngeal exudate and posterior oropharyngeal erythema present.     Tonsils: Tonsillar exudate present. No tonsillar abscesses.     Comments: No uvular deviation or peritonsillar swelling Eyes:     Pupils: Pupils are equal, round, and reactive to light.   Cardiovascular:     Rate and Rhythm: Normal rate and regular rhythm.     Heart sounds: Normal heart sounds. No murmur heard.    No friction rub.  Pulmonary:     Effort: Pulmonary effort is normal.     Breath sounds: Normal breath sounds. No wheezing or rales.  Abdominal:     General: Bowel sounds are normal. There is no distension.     Palpations: Abdomen is soft.     Tenderness: There is no abdominal tenderness. There is no guarding or rebound.  Musculoskeletal:        General: Tenderness present. Normal range of motion.  Legs:     Comments: No edema  Skin:    General: Skin is warm and dry.     Findings: No rash.  Neurological:     Mental Status: She is alert and oriented to person, place, and time.     Cranial Nerves: No cranial nerve deficit.  Psychiatric:        Behavior: Behavior normal.     ED Results / Procedures / Treatments   Labs (all labs ordered are listed, but only abnormal results are displayed) Labs Reviewed  GROUP A STREP BY PCR - Abnormal; Notable for the following components:      Result Value   Group A Strep by PCR DETECTED (*)    All other components within normal limits  RESP PANEL BY RT-PCR (RSV, FLU A&B, COVID)  [id number]  HCG, SERUM, QUALITATIVE    EKG None  Radiology CT Lumbar Spine Wo Contrast Result Date: 01/11/2024 CLINICAL DATA:  Lower back pain EXAM: CT LUMBAR SPINE WITHOUT CONTRAST TECHNIQUE: Multidetector CT imaging of the lumbar spine was performed without intravenous contrast administration. Multiplanar CT image reconstructions were also generated. RADIATION DOSE REDUCTION: This exam was performed according to the departmental dose-optimization program which includes automated exposure control, adjustment of the mA and/or kV according to patient size and/or use of iterative reconstruction technique. COMPARISON:  MRI lumbar spine 04/11/2023, CT lumbar spine 06/12/2021 FINDINGS: Segmentation: 5 lumbar type vertebrae. Alignment: Alignment  is maintained.  No listhesis. Vertebrae: No compression fracture or displaced fracture in the lumbar spine. No suspicious osseous lesion. Paraspinal and other soft tissues: The paraspinal musculature is unremarkable. Redemonstration of 2 mm nonobstructing calculus in the lower pole of the right kidney. Disc levels: Postsurgical changes of right hemilaminectomy at L4-5 compatible with history of prior microdiscectomy. Small disc bulge noted at L4-5 without CT evidence of high-grade spinal canal stenosis. There is no additional spinal canal stenosis or foraminal stenosis within the lumbar spine. IMPRESSION: 1. No acute fracture or traumatic malalignment of the lumbar spine. 2. Postsurgical changes of right hemilaminectomy at L4-5. Small disc bulge at L4-5 without evidence of high-grade spinal canal stenosis. 3. Redemonstration of 2 mm nonobstructing calculus in the lower pole of the right kidney. Electronically Signed   By: Emily Filbert M.D.   On: 01/11/2024 13:06   DG Chest Port 1 View Result Date: 01/11/2024 CLINICAL DATA:  Cough.  Chest pain and sore throat. EXAM: PORTABLE CHEST 1 VIEW COMPARISON:  02/21/2011 FINDINGS: Heart size and mediastinal contours are normal. No pleural fluid, interstitial edema or airspace disease. Visualized osseous structures are unremarkable. IMPRESSION: No active disease. Electronically Signed   By: Signa Kell M.D.   On: 01/11/2024 07:13    Procedures Procedures    Medications Ordered in ED Medications  lidocaine (LIDODERM) 5 % 1 patch (1 patch Transdermal Patch Applied 01/11/24 1313)  promethazine (PHENERGAN) 12.5 mg in sodium chloride 0.9 % 50 mL IVPB (12.5 mg Intravenous New Bag/Given 01/11/24 1319)  lidocaine (XYLOCAINE) 2 % viscous mouth solution 15 mL (15 mLs Mouth/Throat Given 01/11/24 0359)  oxyCODONE-acetaminophen (PERCOCET/ROXICET) 5-325 MG per tablet 1 tablet (1 tablet Oral Given 01/11/24 0448)  lactated ringers bolus 1,000 mL (0 mLs Intravenous Stopped  01/11/24 1013)  dexamethasone (DECADRON) injection 10 mg (10 mg Intravenous Given 01/11/24 0903)  metoCLOPramide (REGLAN) injection 10 mg (10 mg Intravenous Given 01/11/24 0900)  HYDROmorphone (DILAUDID) injection 0.5 mg (0.5 mg Intravenous Given 01/11/24 0905)  penicillin g benzathine (BICILLIN LA) 1200000 UNIT/2ML injection 1.2 Million Units (  1.2 Million Units Intramuscular Given 01/11/24 1013)    ED Course/ Medical Decision Making/ A&P                                 Medical Decision Making Amount and/or Complexity of Data Reviewed External Data Reviewed: notes. Labs: ordered. Decision-making details documented in ED Course. Radiology: ordered and independent interpretation performed. Decision-making details documented in ED Course.  Risk Prescription drug management.   Presenting today with symptoms of sore throat with findings concerning for strep versus viral etiology.  No findings to suggest peritonsillar abscess and low suspicion for epiglottitis, retropharyngeal abscess.  Patient opted for IM penicillin she was given IV fluids and Decadron here due to dehydration and pain.  No trismus or trouble breathing at this time.  Strep test was positive here.  Viral panel was negative.  I have independently visualized and interpreted pt's images today. CXR wnl.  She does report worsening back pain as well after 2 spinal surgeries due to fractures and is concerned she has something worse going on and is requesting imaging.  She reports it never shows up on plain images and her is "requesting a CT.  I have independently visualized and interpreted pt's images today.  CT negative for any acute fracture.  Radiology reports postsurgical changes from hemilaminectomy and with a nonobstructing 2 mm right kidney stone.  Findings discussed with the patient.  At this time she appears clear for discharge home.         Final Clinical Impression(s) / ED Diagnoses Final diagnoses:  Strep pharyngitis   Chronic right-sided low back pain without sciatica    Rx / DC Orders ED Discharge Orders          Ordered    oxyCODONE-acetaminophen (PERCOCET/ROXICET) 5-325 MG tablet  Every 6 hours PRN        01/11/24 1325    magic mouthwash (lidocaine, diphenhydrAMINE, alum & mag hydroxide) suspension  4 times daily PRN        01/11/24 1325    promethazine (PHENERGAN) 25 MG tablet  Every 6 hours PRN        01/11/24 1325              Gwyneth Sprout, MD 01/11/24 1327

## 2024-01-11 NOTE — Discharge Instructions (Addendum)
 The antibiotic will be in your system for approximately a week.  Make sure you change your toothbrush in the next few days.

## 2024-01-11 NOTE — ED Triage Notes (Signed)
 Pt in from home via [facility], chief complaint sore throat and nausea x 3 days. Pt reports cp x 2 days and back pain. Pt states throat is "burning and feels like it is closing up". Pt states Tums have not brought relief. Daughter recently dx with Fifth's Disease  110/70 85HR 22RR 98.2 CBG 135

## 2024-05-14 ENCOUNTER — Other Ambulatory Visit: Payer: Self-pay

## 2024-05-14 ENCOUNTER — Emergency Department (HOSPITAL_COMMUNITY)
Admission: EM | Admit: 2024-05-14 | Discharge: 2024-05-15 | Discharge status: Against Medical Advice | Payer: Worker's Compensation | Attending: Emergency Medicine | Admitting: Emergency Medicine

## 2024-05-14 ENCOUNTER — Encounter (HOSPITAL_COMMUNITY): Payer: Self-pay

## 2024-05-14 DIAGNOSIS — R0789 Other chest pain: Secondary | ICD-10-CM | POA: Insufficient documentation

## 2024-05-14 DIAGNOSIS — R110 Nausea: Secondary | ICD-10-CM | POA: Insufficient documentation

## 2024-05-14 DIAGNOSIS — R202 Paresthesia of skin: Secondary | ICD-10-CM | POA: Diagnosis not present

## 2024-05-14 DIAGNOSIS — R42 Dizziness and giddiness: Secondary | ICD-10-CM | POA: Insufficient documentation

## 2024-05-14 DIAGNOSIS — Z5321 Procedure and treatment not carried out due to patient leaving prior to being seen by health care provider: Secondary | ICD-10-CM | POA: Diagnosis not present

## 2024-05-14 LAB — BASIC METABOLIC PANEL WITH GFR
Anion gap: 9 (ref 5–15)
BUN: 7 mg/dL (ref 6–20)
CO2: 20 mmol/L — ABNORMAL LOW (ref 22–32)
Calcium: 8.6 mg/dL — ABNORMAL LOW (ref 8.9–10.3)
Chloride: 107 mmol/L (ref 98–111)
Creatinine, Ser: 0.9 mg/dL (ref 0.44–1.00)
GFR, Estimated: >60 mL/min (ref >60)
Glucose, Bld: 84 mg/dL (ref 70–99)
Potassium: 3.7 mmol/L (ref 3.5–5.1)
Sodium: 136 mmol/L (ref 135–145)

## 2024-05-14 LAB — CBC WITH DIFFERENTIAL/PLATELET
Abs Immature Granulocytes: 0.02 10*3/uL (ref 0.00–0.07)
Basophils Absolute: 0 10*3/uL (ref 0.0–0.1)
Basophils Relative: 0 %
Eosinophils Absolute: 0.1 10*3/uL (ref 0.0–0.5)
Eosinophils Relative: 1 %
HCT: 37.7 % (ref 36.0–46.0)
Hemoglobin: 12.6 g/dL (ref 12.0–15.0)
Immature Granulocytes: 0 %
Lymphocytes Relative: 32 %
Lymphs Abs: 2.3 10*3/uL (ref 0.7–4.0)
MCH: 30.4 pg (ref 26.0–34.0)
MCHC: 33.4 g/dL (ref 30.0–36.0)
MCV: 91.1 fL (ref 80.0–100.0)
Monocytes Absolute: 0.7 10*3/uL (ref 0.1–1.0)
Monocytes Relative: 10 %
Neutro Abs: 4.1 10*3/uL (ref 1.7–7.7)
Neutrophils Relative %: 57 %
Platelets: 214 10*3/uL (ref 150–400)
RBC: 4.14 MIL/uL (ref 3.87–5.11)
RDW: 12 % (ref 11.5–15.5)
WBC: 7.3 10*3/uL (ref 4.0–10.5)
nRBC: 0 % (ref 0.0–0.2)

## 2024-05-14 MED ORDER — SODIUM CHLORIDE 0.9 % IV BOLUS: 1000.0000 mL | Freq: Once | INTRAVENOUS | Status: DC

## 2024-05-14 MED ORDER — SODIUM CHLORIDE 0.9 % IV SOLN: 12.5000 mg | Freq: Four times a day (QID) | INTRAVENOUS | Status: DC | PRN

## 2024-05-14 NOTE — ED Provider Triage Note (Signed)
 Emergency Medicine Provider Triage Evaluation Note  HUDSON LEHMKUHL , a 33 y.o. female  was evaluated in triage.  Pt complains of nausea and some tingliness.  Patient works at [employer] and was smelling some noxious fumes.  Coworker here for similar.  She was complaining of some chest tightness earlier but that is since resolved.  Review of Systems  Positive:  Negative: See above   Physical Exam  BP 112/72   Pulse 68   Temp 98.3 F (36.8 C) (Oral)   Resp 18   SpO2 96%  Gen:   Awake, no distress   Resp:  Normal effort  MSK:   Moves extremities without difficulty  Other:    Medical Decision Making  Medically screening exam initiated at 7:05 PM.  Appropriate orders placed.  Lyndell M Waldman was informed that the remainder of the evaluation will be completed by another provider, this initial triage assessment does not replace that evaluation, and the importance of remaining in the ED until their evaluation is complete.     Theotis Peers [place], [place] 05/14/24 1907

## 2024-05-14 NOTE — ED Notes (Signed)
 Pt stated that she and her friend are leaving

## 2024-05-14 NOTE — ED Triage Notes (Addendum)
 Pt bib ems from work, began smelling gas in store; called out for possible carbon monoxide; pt lying on concrete when ems arrived, pt states she laid self down when she started feeling poorly, did not fall; ; c/o being lightheaded, dizzy, numbness, and sob; motor and sensory intact; hx bipolar and anxiety; 112/70, sats 99% RA, HR 66, RR 20; c/o some cp with expiration; NAD in triage  EMS reports FD reported no gas leak detected in store

## 2024-08-11 NOTE — Progress Notes (Signed)
 Patient Name: Abigail Harding MR#: [id number] Author: Jordan Miller Case, MD    Subjective:   CC: Bilateral shoulder pain right greater than left  HPI: [name] is a 33 y.o. female who presents with chief complaint of bilateral shoulder pain.  The pain has been present for approximately 3 months.  There is associated radiating pain that goes down into the hand with numbness into the her hand as well.  She has a history of degenerative disc disease and has had surgery on her lower back for radiculopathy issues.  HPI     Follow-Up Test Results    Additional comments: Room 3. Pt presents today for Cervical Spine MRI Review. Pt states her Left arm is now in pain since she can only work with her Left Arm.      Last edited by Carlin DELENA Moons, CMA on 08/11/2024  2:46 PM.       --- Interval History  08/11/24: Pt presents today for Cervical Spine MRI Review. Pt states her Left arm is now in pain since she can only work with her left Arm. The Medrol  Dosepak and Lyrica  didn't provide any relief. The muscle relaxer helped pt with sleeping, but didn't provide pain relief. Pain is at anterior shoulder radiating to clavicle and lateral thump. She state she has a lot of weakness, accompanied by tingling and numbness. Her motion is limited. No hx of dislocation. Pt allergic to tramadol and CSI. ---  Objective:   Physical Exam:  Physical examination is very limited by pain active forward flexion is to about 40 degrees, passively I can get her in to around 130 degrees which causes her very severe pain.  I cannot get a great C5 read on her right shoulder due to the pain putting her shoulder into an abducted position.  She does not lose a pulse in the arm when I get her arm overhead.  C6-T1 muscle testing does not show any obvious neurologic weakness, but the exam is a little bit unreliable again secondary to her pain. __ Diagnostic Studies/Labs:  Prior x-rays of the right shoulder and  x-rays of her cervical spine were reviewed and independently interpreted.  The right shoulder x-rays are grossly normal.  The cervical spine x-rays demonstrate a loss of normal cervical lordosis but do not have any evidence of trauma or advanced degenerative change.  MRI of C-spine from 08/06/24 demonstrates no evidence of any stenosis  Assessment:  33 y.o. 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 I will be referring the pt to neurology for further evaluation and to determine the next steps. I prescribed Tizanidine 4mg  q8 prn today.  __ Plan of care:   Neurology referral Tizanidine 4mg  q8 prn prescription  Follow up as needed  __ MDM:  Medical Decision Making Elements:  Number/Complexity of Problems: Undiagnosed new problem with uncertain prognosis Risk of Complications/morbidity: Moderate, prescription drug management.  Amount/Complexity of Data: low  This document serves as a record of services personally performed by Jordan Case, MD. It was created on their behalf by , Morgan Gee  a trained medical scribe. The creation of this record is the provider's dictation and/or activities during the visit.   Electronically signed by: Morgan Gee, Scribe 08/11/2024 8:48 AM  I agree the  documentation is accurate and complete.  Electronically signed by: Jordan Case, MD 08/11/2024 8:48 AM
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
## 2024-10-04 ENCOUNTER — Emergency Department (HOSPITAL_COMMUNITY)

## 2024-10-04 ENCOUNTER — Inpatient Hospital Stay (HOSPITAL_COMMUNITY)
Admission: EM | Admit: 2024-10-04 | Discharge: 2024-10-08 | DRG: 552 | Disposition: A | Discharge status: Home / Self Care | Attending: Internal Medicine | Admitting: Internal Medicine

## 2024-10-04 ENCOUNTER — Other Ambulatory Visit: Payer: Self-pay

## 2024-10-04 ENCOUNTER — Encounter (HOSPITAL_COMMUNITY): Payer: Self-pay

## 2024-10-04 DIAGNOSIS — F319 Bipolar disorder, unspecified: Secondary | ICD-10-CM | POA: Diagnosis present

## 2024-10-04 DIAGNOSIS — Z800 Family history of malignant neoplasm of digestive organs: Secondary | ICD-10-CM

## 2024-10-04 DIAGNOSIS — F411 Generalized anxiety disorder: Secondary | ICD-10-CM | POA: Diagnosis present

## 2024-10-04 DIAGNOSIS — Z79899 Other long term (current) drug therapy: Secondary | ICD-10-CM

## 2024-10-04 DIAGNOSIS — Z888 Allergy status to other drugs, medicaments and biological substances status: Secondary | ICD-10-CM

## 2024-10-04 DIAGNOSIS — Z803 Family history of malignant neoplasm of breast: Secondary | ICD-10-CM

## 2024-10-04 DIAGNOSIS — Z87891 Personal history of nicotine dependence: Secondary | ICD-10-CM

## 2024-10-04 DIAGNOSIS — G4700 Insomnia, unspecified: Secondary | ICD-10-CM | POA: Diagnosis not present

## 2024-10-04 DIAGNOSIS — M5126 Other intervertebral disc displacement, lumbar region: Secondary | ICD-10-CM | POA: Diagnosis present

## 2024-10-04 DIAGNOSIS — M5442 Lumbago with sciatica, left side: Principal | ICD-10-CM | POA: Diagnosis present

## 2024-10-04 DIAGNOSIS — D72829 Elevated white blood cell count, unspecified: Secondary | ICD-10-CM | POA: Diagnosis present

## 2024-10-04 DIAGNOSIS — L299 Pruritus, unspecified: Secondary | ICD-10-CM | POA: Diagnosis not present

## 2024-10-04 DIAGNOSIS — M5430 Sciatica, unspecified side: Secondary | ICD-10-CM | POA: Diagnosis present

## 2024-10-04 DIAGNOSIS — R52 Pain, unspecified: Secondary | ICD-10-CM

## 2024-10-04 DIAGNOSIS — Z9049 Acquired absence of other specified parts of digestive tract: Secondary | ICD-10-CM

## 2024-10-04 DIAGNOSIS — Z87442 Personal history of urinary calculi: Secondary | ICD-10-CM

## 2024-10-04 DIAGNOSIS — Z833 Family history of diabetes mellitus: Secondary | ICD-10-CM

## 2024-10-04 DIAGNOSIS — M5416 Radiculopathy, lumbar region: Secondary | ICD-10-CM | POA: Diagnosis present

## 2024-10-04 DIAGNOSIS — Z8249 Family history of ischemic heart disease and other diseases of the circulatory system: Secondary | ICD-10-CM

## 2024-10-04 DIAGNOSIS — R29898 Other symptoms and signs involving the musculoskeletal system: Secondary | ICD-10-CM

## 2024-10-04 DIAGNOSIS — Z885 Allergy status to narcotic agent status: Secondary | ICD-10-CM

## 2024-10-04 DIAGNOSIS — R202 Paresthesia of skin: Secondary | ICD-10-CM | POA: Diagnosis present

## 2024-10-04 DIAGNOSIS — R531 Weakness: Secondary | ICD-10-CM | POA: Diagnosis present

## 2024-10-04 DIAGNOSIS — G8929 Other chronic pain: Principal | ICD-10-CM | POA: Diagnosis present

## 2024-10-04 DIAGNOSIS — M545 Low back pain, unspecified: Secondary | ICD-10-CM | POA: Diagnosis present

## 2024-10-04 HISTORY — DX: Neuralgic amyotrophy: G54.5

## 2024-10-04 MED ORDER — HYDROXYZINE HCL 50 MG/ML IM SOLN
25.0000 mg | Freq: Four times a day (QID) | INTRAMUSCULAR | Status: DC | PRN
  Administered 2024-10-06: 25 mg via INTRAMUSCULAR
  Filled 2024-10-04 (×2): qty 0.5

## 2024-10-04 MED ORDER — ENOXAPARIN SODIUM 40 MG/0.4ML IJ SOSY
40.0000 mg | PREFILLED_SYRINGE | INTRAMUSCULAR | Status: DC
  Administered 2024-10-05 – 2024-10-08 (×4): 40 mg via SUBCUTANEOUS
  Filled 2024-10-04 (×4): qty 0.4

## 2024-10-04 MED ORDER — GADOBUTROL 1 MMOL/ML IV SOLN
6.0000 mL | Freq: Once | INTRAVENOUS | Status: AC | PRN
  Administered 2024-10-04: 6 mL via INTRAVENOUS

## 2024-10-04 MED ORDER — DIPHENHYDRAMINE HCL 50 MG/ML IJ SOLN
50.0000 mg | Freq: Once | INTRAMUSCULAR | Status: AC
  Administered 2024-10-04: 50 mg via INTRAVENOUS
  Filled 2024-10-04: qty 1

## 2024-10-04 MED ORDER — PROMETHAZINE HCL 12.5 MG PO TABS
12.5000 mg | ORAL_TABLET | Freq: Four times a day (QID) | ORAL | Status: DC | PRN
  Administered 2024-10-05: 12.5 mg via ORAL
  Filled 2024-10-04: qty 1

## 2024-10-04 MED ORDER — LACTATED RINGERS IV BOLUS
1000.0000 mL | Freq: Once | INTRAVENOUS | Status: AC
  Administered 2024-10-04: 1000 mL via INTRAVENOUS

## 2024-10-04 MED ORDER — METHOCARBAMOL 1000 MG/10ML IJ SOLN: 500.0000 mg | Freq: Four times a day (QID) | INTRAMUSCULAR | Status: DC | PRN

## 2024-10-04 MED ORDER — HYDROMORPHONE HCL 1 MG/ML IJ SOLN
1.0000 mg | Freq: Once | INTRAMUSCULAR | Status: AC
  Administered 2024-10-04: 1 mg via INTRAVENOUS
  Filled 2024-10-04: qty 1

## 2024-10-04 MED ORDER — DIPHENHYDRAMINE HCL 50 MG/ML IJ SOLN
12.5000 mg | Freq: Once | INTRAMUSCULAR | Status: AC
Start: 2024-10-04 — End: 2024-10-04
  Administered 2024-10-04: 12.5 mg via INTRAVENOUS
  Filled 2024-10-04: qty 1

## 2024-10-04 MED ORDER — DOCUSATE SODIUM 100 MG PO CAPS
100.0000 mg | ORAL_CAPSULE | Freq: Two times a day (BID) | ORAL | Status: DC
  Administered 2024-10-05 – 2024-10-08 (×7): 100 mg via ORAL
  Filled 2024-10-04 (×7): qty 1

## 2024-10-04 MED ORDER — LIDOCAINE 5 % EX PTCH
1.0000 | MEDICATED_PATCH | CUTANEOUS | Status: DC
  Administered 2024-10-04 – 2024-10-08 (×5): 1 via TRANSDERMAL
  Filled 2024-10-04 (×5): qty 1

## 2024-10-04 MED ORDER — DIPHENHYDRAMINE HCL 50 MG/ML IJ SOLN
25.0000 mg | Freq: Once | INTRAMUSCULAR | Status: AC
  Administered 2024-10-04: 25 mg via INTRAVENOUS
  Filled 2024-10-04: qty 1

## 2024-10-04 MED ORDER — KETOROLAC TROMETHAMINE 15 MG/ML IJ SOLN
15.0000 mg | Freq: Once | INTRAMUSCULAR | Status: AC
  Administered 2024-10-04: 15 mg via INTRAVENOUS
  Filled 2024-10-04: qty 1

## 2024-10-04 MED ORDER — SENNA 8.6 MG PO TABS
1.0000 | ORAL_TABLET | Freq: Two times a day (BID) | ORAL | Status: DC
  Administered 2024-10-05 – 2024-10-08 (×7): 8.6 mg via ORAL
  Filled 2024-10-04 (×7): qty 1

## 2024-10-04 MED ORDER — LIDOCAINE 5 % EX PTCH
1.0000 | MEDICATED_PATCH | CUTANEOUS | Status: DC
  Administered 2024-10-04: 1 via TRANSDERMAL
  Filled 2024-10-04: qty 1

## 2024-10-04 MED ORDER — ACETAMINOPHEN 650 MG RE SUPP: 650.0000 mg | Freq: Four times a day (QID) | RECTAL | Status: DC | PRN

## 2024-10-04 MED ORDER — OXYCODONE HCL 5 MG PO TABS
5.0000 mg | ORAL_TABLET | ORAL | Status: DC | PRN
  Administered 2024-10-05 – 2024-10-06 (×3): 5 mg via ORAL
  Filled 2024-10-04 (×3): qty 1

## 2024-10-04 MED ORDER — SODIUM CHLORIDE 0.9 % IV SOLN
12.5000 mg | Freq: Four times a day (QID) | INTRAVENOUS | Status: DC | PRN
  Administered 2024-10-04: 12.5 mg via INTRAVENOUS
  Filled 2024-10-04: qty 0.5
  Filled 2024-10-04: qty 12.5

## 2024-10-04 MED ORDER — LORAZEPAM 1 MG PO TABS
2.0000 mg | ORAL_TABLET | Freq: Once | ORAL | Status: AC
  Administered 2024-10-04: 2 mg via ORAL
  Filled 2024-10-04: qty 2

## 2024-10-04 MED ORDER — HYDROMORPHONE HCL 1 MG/ML IJ SOLN
0.5000 mg | INTRAMUSCULAR | Status: DC | PRN
  Administered 2024-10-05 (×3): 1 mg via INTRAVENOUS
  Filled 2024-10-04 (×3): qty 1

## 2024-10-04 MED ORDER — GABAPENTIN 300 MG PO CAPS
300.0000 mg | ORAL_CAPSULE | Freq: Once | ORAL | Status: AC
  Administered 2024-10-04: 300 mg via ORAL
  Filled 2024-10-04: qty 1

## 2024-10-04 MED ORDER — OXYCODONE-ACETAMINOPHEN 5-325 MG PO TABS
2.0000 | ORAL_TABLET | Freq: Once | ORAL | Status: AC
  Administered 2024-10-04: 2 via ORAL
  Filled 2024-10-04: qty 2

## 2024-10-04 MED ORDER — DEXAMETHASONE SOD PHOSPHATE PF 10 MG/ML IJ SOLN
10.0000 mg | Freq: Once | INTRAMUSCULAR | Status: AC
Start: 2024-10-04 — End: 2024-10-04
  Administered 2024-10-04: 10 mg via INTRAVENOUS

## 2024-10-04 MED ORDER — ACETAMINOPHEN 325 MG PO TABS
650.0000 mg | ORAL_TABLET | Freq: Four times a day (QID) | ORAL | Status: DC | PRN
  Administered 2024-10-06 – 2024-10-08 (×2): 650 mg via ORAL
  Filled 2024-10-04 (×2): qty 2

## 2024-10-04 NOTE — ED Provider Notes (Signed)
 Physical Exam  BP 104/64 (BP Location: Right Arm)   Pulse 87   Temp 98.2 F (36.8 C) (Oral)   Resp 16   Ht 5' 4 (1.626 m)   Wt 59 kg   SpO2 100%   BMI 22.31 kg/m   Physical Exam Vitals and nursing note reviewed.  Constitutional:      General: She is in acute distress.     Appearance: She is well-developed. She is not ill-appearing.     Comments: Significant acute distress.  HENT:     Head: Normocephalic and atraumatic.  Eyes:     Conjunctiva/sclera: Conjunctivae normal.  Cardiovascular:     Rate and Rhythm: Normal rate and regular rhythm.     Heart sounds: No murmur heard. Pulmonary:     Effort: Pulmonary effort is normal. No respiratory distress.     Breath sounds: Normal breath sounds.  Abdominal:     Palpations: Abdomen is soft.     Tenderness: There is no abdominal tenderness.  Musculoskeletal:        General: No swelling.     Cervical back: Neck supple.  Skin:    General: Skin is warm and dry.     Capillary Refill: Capillary refill takes less than 2 seconds.  Neurological:     Mental Status: She is alert and oriented to person, place, and time.     Motor: Weakness present.     Gait: Gait abnormal.     Comments: 1/5 strength left lower extremity in comparison to the right, with 4+/5 strength.  No appreciable sensation to the left lower extremity in comparison to the right, patient is completely unresponsive to painful stimulus.  5/5 strength bilateral upper extremities.  Attempted to walk numerous times, patient unable to bear weight on left lower extremity in any capacity.  Psychiatric:        Mood and Affect: Mood normal.     Procedures  Procedures  ED Course / MDM   Clinical Course as of 10/05/24 0104  Sat Oct 04, 2024  1604 S, [facility], L4-5 discectomy, revision previously, same surgery, wroks at [employer], moving pallats yesterday, tweeked back, acute L leg weakness and back pain. Sig pain on arrival, given dilaudid , wearing off, toradol  and  benadryl  now. Feeling better. MR lumbar reassuring, nsgy consulted and rec MR thoracic, f/u imaging.   [BS]  2310 We have attempted numerous times to be able to walk the patient, however we are unable to do so.  Patient with significant difficulty with attempted walking, unable to put weight on her left leg, stating that it is too weak to be able to support her.  We have tried this numerous times, have attempted numerous times for pain control while in the ED, spoken with both neurology and neurosurgery.  This is very likely to be a functional neurologic deficit, however as we are unable to have the patient attempt to walk out of the ED at this time, patient will require admission at this time. [BS]    Clinical Course User Index [BS] Arlee Katz, MD   Medical Decision Making Amount and/or Complexity of Data Reviewed Radiology: ordered.  Risk Prescription drug management. Decision regarding hospitalization.   Per workup today, patient with focal finding on MRI of the right L5 post laminectomy site, with possible compression of the right, however no acute evidence of findings on the left side.  Patient was evaluated by both neurosurgery and neurology, who are concerned that this is likely a  functional neurologic deficit.  No surgical interventions are recommended at this time.  Patient overall with very significant difficulty controlling pain, requiring numerous rounds of medication to be able to help with controlling pain.  Attempted to walk numerous times, however unable to walk, and repeatedly nearly falling over despite significant medical support from nursing staff.  Overall, I have clinical concern of the patient's neurologic deficit at this time, will require PT and OT.  Therefore, discussed with hospitalist team, agreeable to admission for PT and OT, continue monitoring, and pain control.  Overall has remained hemodynamically stable throughout time in ED.       Arlee Katz,  MD 10/05/24 0104    Ula Prentice SAUNDERS, MD 10/05/24 2216

## 2024-10-04 NOTE — ED Provider Notes (Signed)
 [place] EMERGENCY DEPARTMENT AT [facility] Provider Note   CSN: [id number] Arrival date & time: 10/04/24  1052     Patient presents with: Back Pain   Abigail Harding is a 33 y.o. female who presents to the emergency department with a chief complaint of low back pain.  Patient has a history of chronic low back pain with 2 surgeries performed at the L4-5 level by her neurosurgeon at [facility] neurosurgery.  Patient states that she works at [employer] and was lifting some dog food yesterday whenever she aggravated her back.  Patient states that while walking at work earlier today she had an acute onset of pain and weakness in her left leg.  Patient states that she is unable to ambulate properly and unable to move her left leg.  Denies groin numbness/tingling, bowel/bladder incontinence.  Past medical history significant for sciatica, chronic low back pain, right L4-5 lumbar microdiscectomy, revision of L4-L5 for lumbar microdiscectomy.  Patient is also attempted multiple injections to her spine without significant improvement. Denies acute groin numbness/tingling, bowel/bladder incontinence or retention. Denies fevers, chills, IV drug use.     Back Pain      Prior to Admission medications   Medication Sig Start Date End Date Taking? Authorizing Provider  albuterol (VENTOLIN HFA) 108 (90 Base) MCG/ACT inhaler Inhale 2 puffs into the lungs daily as needed for shortness of breath. 05/15/21   [provider]  busPIRone  (BUSPAR ) 7.5 MG tablet Take 7.5 mg by mouth 3 (three) times daily. 10/30/23   [provider]  clonazePAM  (KLONOPIN ) 0.5 MG tablet Take 1 tablet (0.5 mg total) by mouth daily as needed (anxiety). 02/22/22   Milissa Tod PARAS, MD  cyclobenzaprine  (FLEXERIL ) 5 MG tablet Take 1 tablet (5 mg total) by mouth 3 (three) times daily as needed for muscle spasms. Patient taking differently: Take 5 mg by mouth daily as needed for muscle spasms. 02/20/22   Nivia Colon, PA-C  dicyclomine  (BENTYL ) 20 MG tablet Take 1 tablet (20 mg total) by mouth 2 (two) times daily for 7 days. 08/14/23 08/21/23  Soto, Johana, PA-C  DONNATAL 16.2 MG tablet Take 1 tablet by mouth 3 (three) times daily as needed. 08/16/23   [provider]  Drospirenone  (SLYND ) 4 MG TABS Take 1 tablet by mouth daily. Pt skips placebo pills and continues to next pack in order to skip period 09/30/22   Doretha Folks, MD  FLUoxetine  (PROZAC ) 20 MG capsule Take 20 mg by mouth daily.    [provider]  fluticasone  (FLONASE ) 50 MCG/ACT nasal spray Place 1 spray into the nose daily as needed.    [provider]  hyoscyamine  (LEVSIN AMIEL) 0.125 MG SL tablet Place 1 tablet (0.125 mg total) under the tongue every 4 (four) hours as needed. 08/10/23   Ula Prentice SAUNDERS, MD  INDERAL LA 80 MG 24 hr capsule Take 80 mg by mouth daily. 08/16/23   [provider]  lidocaine  (LIDODERM ) 5 % Place 1 patch onto the skin daily as needed (pain). Remove & Discard patch within 12 hours or as directed by MD    [provider]  magic mouthwash (lidocaine , diphenhydrAMINE , alum & mag hydroxide) suspension Swish and swallow 5 mLs 4 (four) times daily as needed for mouth pain. 01/11/24   Doretha Folks, MD  metoCLOPramide  (REGLAN ) 10 MG tablet Take 1 tablet (10 mg total) by mouth every 6 (six) hours as needed for nausea or vomiting. 12/19/22   Dreama Longs, MD  Na Sulfate-K Sulfate-Mg Sulf 17.5-3.13-1.6 GM/177ML SOLN Take 177 mLs by mouth. 11/05/23   [provider]  oxyCODONE -acetaminophen  (PERCOCET/ROXICET) 5-325 MG tablet Take 1 tablet by mouth every 6 (six) hours as needed for severe pain (pain score 7-10). 01/11/24   Doretha Folks, MD  pantoprazole  (PROTONIX ) 40 MG tablet Take 1 tablet (40 mg total) by mouth daily for 14 days. 12/19/22 01/02/23  Dreama Longs, MD  Polyvinyl Alcohol -Povidone (REFRESH OP) Place 1 drop into both eyes 3 (three) times daily as needed (dry  eyes).    [provider]  predniSONE  (DELTASONE ) 20 MG tablet 3 tabs po day one, then 2 tabs daily x 4 days 02/20/22   Nivia Colon, PA-C  promethazine  (PHENERGAN ) 25 MG tablet Take 1 tablet (25 mg total) by mouth every 6 (six) hours as needed for nausea or vomiting. 01/11/24   Doretha Folks, MD  promethazine -dextromethorphan  (PROMETHAZINE -DM) 6.25-15 MG/5ML syrup Take 5 mLs by mouth 4 (four) times daily as needed for cough. 09/04/23   [provider]  QUEtiapine  (SEROQUEL  XR) 50 MG TB24 24 hr tablet Take 50 mg by mouth at bedtime. 10/26/23   [provider]  rizatriptan (MAXALT-MLT) 10 MG disintegrating tablet Take 10 mg by mouth as needed for migraine. 10/30/23   [provider]  tamsulosin (FLOMAX) 0.4 MG CAPS capsule Take 0.4 mg by mouth every evening. 08/13/23   [provider]  tiZANidine (ZANAFLEX) 2 MG tablet Take 4 mg by mouth every 8 (eight) hours as needed.    [provider]  traZODone  (DESYREL ) 100 MG tablet Take 100 mg by mouth at bedtime as needed.    [provider]  traZODone  (DESYREL ) 50 MG tablet Take 0.5 tablets (25 mg total) by mouth at bedtime as needed for sleep. 02/22/22   Milissa Tod PARAS, MD    Allergies: Hydrocodone , Ketorolac , Morphine and codeine, Tramadol, Venlafaxine , Betamethasone, Flonase  [fluticasone ], Zofran  [ondansetron ], and Hydroxyzine     Review of Systems  Musculoskeletal:  Positive for back pain.    Updated Vital Signs BP (!) 105/55 (BP Location: Left Arm)   Pulse 71   Temp 98.2 F (36.8 C) (Oral)   Resp 14   Ht 5' 4 (1.626 m)   Wt 59 kg   SpO2 99%   BMI 22.31 kg/m   Physical Exam Vitals and nursing note reviewed.  Constitutional:      General: She is awake. She is not in acute distress.    Appearance: Normal appearance. She is not ill-appearing, toxic-appearing or diaphoretic.  HENT:     Head: Normocephalic and atraumatic.  Eyes:     General: No scleral icterus. Pulmonary:      Effort: Pulmonary effort is normal. No respiratory distress.  Musculoskeletal:     Cervical back: Normal range of motion. No tenderness.     Right lower leg: No edema.     Left lower leg: No edema.     Comments: Normal ROM of RLE however patient unable to move LLE due to pain other than just moving toes of LLE  Lumbar spine tender to palpation diffusely following sciatic nerve distribution   Patient able to raise bilateral upper extremities against gravity as well as resistance, equal grip strength bilaterally  Skin:    General: Skin is warm.     Capillary Refill: Capillary refill takes less than 2 seconds.  Neurological:     General: No focal deficit present.     Mental Status: She is alert and oriented to person, place,  and time.     Comments: No facial droop, no slurred speech, no visual disturbances  Psychiatric:        Mood and Affect: Mood normal.        Behavior: Behavior normal. Behavior is cooperative.     (all labs ordered are listed, but only abnormal results are displayed) Labs Reviewed - No data to display  EKG: None  Radiology: MR Lumbar Spine W Wo Contrast Result Date: 10/04/2024 CLINICAL DATA:  Low back pain.  Acute leg weakness. EXAM: MRI LUMBAR SPINE WITHOUT AND WITH CONTRAST TECHNIQUE: Multiplanar and multiecho pulse sequences of the lumbar spine were obtained without and with intravenous contrast. CONTRAST:  6mL GADAVIST  GADOBUTROL  1 MMOL/ML IV SOLN COMPARISON:  CT 01/11/2024.  MRI 04/11/2023. FINDINGS: Segmentation:  5 lumbar type vertebral bodies. Alignment:  No malalignment. Vertebrae: No fracture or focal bone lesion. Mild endplate edematous change at L4-5 could relate to low back pain. Conus medullaris and cauda equina: Conus extends to the L1 level. Conus and cauda equina appear normal. Paraspinal and other soft tissues: Negative Disc levels: No abnormality from T11-12 through L3-4. L4-5: Previous right hemilaminectomy. Shallow disc protrusion with a focal  herniation centrally and just to the right of midline with slight caudal down turning. This is slightly larger than was seen in May of 2024. This results in lateral recess narrowing on the right which could possibly affect the L5 nerve. L5-S1: Normal IMPRESSION: 1. L4-5: Previous right hemilaminectomy. Shallow disc protrusion with a focal herniation centrally and just to the right of midline with slight caudal down turning. This is slightly larger than was seen in May of 2024. This results in lateral recess narrowing on the right which could possibly affect the right L5 nerve. 2. Mild endplate edematous change at L4-5 could relate to low back pain. Electronically Signed   By: Oneil Officer M.D.   On: 10/04/2024 14:56     Procedures   Medications Ordered in the ED  promethazine  (PHENERGAN ) 12.5 mg in sodium chloride  0.9 % 50 mL IVPB (0 mg Intravenous Stopped 10/04/24 1402)  lidocaine  (LIDODERM ) 5 % 1 patch (1 patch Transdermal Patch Removed 10/04/24 1528)  lidocaine  (LIDODERM ) 5 % 1 patch (1 patch Transdermal Patch Applied 10/04/24 1530)  HYDROmorphone  (DILAUDID ) injection 1 mg (1 mg Intravenous Given 10/04/24 1315)  dexamethasone  (DECADRON ) injection 10 mg (10 mg Intravenous Given 10/04/24 1312)  gadobutrol  (GADAVIST ) 1 MMOL/ML injection 6 mL (6 mLs Intravenous Contrast Given 10/04/24 1437)  ketorolac  (TORADOL ) 15 MG/ML injection 15 mg (15 mg Intravenous Given 10/04/24 1530)  diphenhydrAMINE  (BENADRYL ) injection 12.5 mg (12.5 mg Intravenous Given 10/04/24 1530)    Clinical Course as of 10/04/24 1638  Sat Oct 04, 2024  1604 S, [facility], L4-5 discectomy, revision previously, same surgery, wroks at [employer], moving pallats yesterday, tweeked back, acute L leg weakness and back pain. Sig pain on arrival, given dilaudid , wearing off, toradol  and benadryl  now. Feeling better. MR lumbar reassuring, nsgy consulted and rec MR thoracic, f/u imaging.   [BS]    Clinical Course User Index [BS]  Arlee Katz, MD                                 Medical Decision Making Amount and/or Complexity of Data Reviewed Radiology: ordered.  Risk Prescription drug management.   Patient presents to the ED for concern of low back pain, this involves an extensive number of treatment  options, and is a complaint that carries with it a high risk of complications and morbidity.  The differential diagnosis includes compression fracture, bulging disc, cauda equina syndrome, spinal stenosis, mass, etc.   Co morbidities that complicate the patient evaluation  sciatica, chronic low back pain, right L4-5 lumbar microdiscectomy, revision of L4-L5 for lumbar microdiscectomy   Additional history obtained:  Additional history obtained from previous medical record including previous back surgeries   Imaging Studies ordered:  I ordered imaging studies including MRI lumbar spine with and without contrast I independently visualized and interpreted imaging which showed: L4-5 previous right hemilaminectomy, shallow disc protrusion with a focal herniation centrally and just to the right of midline with slight caudal downturning slightly larger than seen back in May 2024 could possibly affect a right L5 nerve, mild endplate edematous changes at L4 could relate to low back pain I agree with the radiologist interpretation    Medicines ordered and prescription drug management:  I ordered medication including lidocaine  patch for pain, Phenergan  for nausea, Decadron  for back pain, Dilaudid  for back pain, Toradol  for back pain, Benadryl  for allergy to Toradol  Reevaluation of the patient after these medicines showed that the patient improved I have reviewed the patients home medicines and have made adjustments as needed   Test Considered:  None   Critical Interventions:  None   Consultations Obtained:  I requested consultation with the neurosurgery team,  and discussed lab and imaging findings  as well as pertinent plan - they recommend: MRI thoracic spine which I ordered and was pending at time of my sign-out   Problem List / ED Course:  33 year old female, vital signs stable, presents with acute worsening of chronic back pain on left side, patient states that she is unable to move her left leg, denies groin numbness/tingling or bowel/bladder incontinence On physical exam patient initially laying on her side in significant pain, patient continually states she is unable to move her left leg however patient is able to wiggle toes of left lower extremity, patient also appreciates chronic left leg numbness from previous back surgeries, otherwise neurologically intact Due to significant past medical history as well as questionable neurologic exam, MRI of the lumbar spine ordered which showed acute worsening of chronic conditions, no evidence of cauda equina syndrome Patient updated on findings and continue to be symptomatically treated, patient still adamant that she cannot move her left lower extremity and that this is what happened last time whenever she needed emergency surgery Out of abundance of caution we will speak with on-call for [place] [facility], spoke with Johnanna RIGGERS who recommend out of caution to get MRI thoracic spine without contrast, this study was ordered Patient signed out to oncoming provider Dr. Arlee who assumes care over this patient, further work-up, and disposition Plan currently is for ongoing pain control and outpatient follow-up with established neurosurgeon if MRI thoracic spine is negative for acute process   Reevaluation:  After the interventions noted above, I reevaluated the patient and found that they have :improved   Social Determinants of Health:  None   Dispostion:  Patient signed out to oncoming provider Dr. Arlee who assumes care over this patient, further work-up, and disposition     Final diagnoses:  Acute left-sided low back  pain with left-sided sciatica  Left leg weakness    ED Discharge Orders     None          Janetta Terrall FALCON, [place] 10/04/24 1639    Laurice Coy  C, MD 10/05/24 [id number]

## 2024-10-04 NOTE — ED Triage Notes (Signed)
 Pt to ED via [facility] from work. Pt was moving pet food bags yesterday and her lower back started hurting last night. Pt has pain and numbness in left leg, and right leg pain. Pt denies incontinence. Pt has had surgery for herniated discs in the past and states this feels similar.

## 2024-10-04 NOTE — ED Notes (Signed)
 Pt to MRI

## 2024-10-05 ENCOUNTER — Observation Stay (HOSPITAL_COMMUNITY)

## 2024-10-05 DIAGNOSIS — M5442 Lumbago with sciatica, left side: Secondary | ICD-10-CM | POA: Diagnosis not present

## 2024-10-05 DIAGNOSIS — M545 Low back pain, unspecified: Secondary | ICD-10-CM

## 2024-10-05 DIAGNOSIS — R531 Weakness: Secondary | ICD-10-CM

## 2024-10-05 DIAGNOSIS — R29898 Other symptoms and signs involving the musculoskeletal system: Secondary | ICD-10-CM | POA: Diagnosis not present

## 2024-10-05 LAB — CBC
HCT: 37.9 % (ref 36.0–46.0)
Hemoglobin: 12.8 g/dL (ref 12.0–15.0)
MCH: 30.6 pg (ref 26.0–34.0)
MCHC: 33.8 g/dL (ref 30.0–36.0)
MCV: 90.7 fL (ref 80.0–100.0)
Platelets: 207 K/uL (ref 150–400)
RBC: 4.18 MIL/uL (ref 3.87–5.11)
RDW: 12.1 % (ref 11.5–15.5)
WBC: 10.3 K/uL (ref 4.0–10.5)
nRBC: 0 % (ref 0.0–0.2)

## 2024-10-05 LAB — CREATININE, SERUM
Creatinine, Ser: 0.87 mg/dL (ref 0.44–1.00)
GFR, Estimated: >60 mL/min (ref >60)

## 2024-10-05 MED ORDER — PROCHLORPERAZINE EDISYLATE 10 MG/2ML IJ SOLN
10.0000 mg | Freq: Four times a day (QID) | INTRAMUSCULAR | Status: DC | PRN
Start: 2024-10-05 — End: 2024-10-08
  Administered 2024-10-05 – 2024-10-08 (×6): 10 mg via INTRAVENOUS
  Filled 2024-10-05 (×6): qty 2

## 2024-10-05 MED ORDER — LIDOCAINE 5 % EX PTCH: 1.0000 | MEDICATED_PATCH | CUTANEOUS | Status: DC

## 2024-10-05 MED ORDER — HYDROMORPHONE HCL 1 MG/ML IJ SOLN
0.5000 mg | INTRAMUSCULAR | Status: DC | PRN
  Administered 2024-10-05 – 2024-10-06 (×3): 1 mg via INTRAVENOUS
  Filled 2024-10-05 (×3): qty 1

## 2024-10-05 MED ORDER — LORAZEPAM 0.5 MG PO TABS
0.5000 mg | ORAL_TABLET | Freq: Four times a day (QID) | ORAL | Status: AC | PRN
Start: 2024-10-05 — End: 2024-10-06

## 2024-10-05 MED ORDER — GABAPENTIN 300 MG PO CAPS
300.0000 mg | ORAL_CAPSULE | Freq: Three times a day (TID) | ORAL | Status: DC
  Administered 2024-10-05 – 2024-10-07 (×7): 300 mg via ORAL
  Filled 2024-10-05 (×7): qty 1

## 2024-10-05 MED ORDER — METHYLPREDNISOLONE SODIUM SUCC 125 MG IJ SOLR
125.0000 mg | Freq: Once | INTRAMUSCULAR | Status: AC
  Administered 2024-10-05: 125 mg via INTRAVENOUS
  Filled 2024-10-05: qty 2

## 2024-10-05 MED ORDER — DEXAMETHASONE SODIUM PHOSPHATE 4 MG/ML IJ SOLN: 4.0000 mg | INTRAMUSCULAR | Status: DC

## 2024-10-05 MED ORDER — PROMETHAZINE HCL 12.5 MG PO TABS
12.5000 mg | ORAL_TABLET | Freq: Four times a day (QID) | ORAL | Status: AC | PRN
  Administered 2024-10-06 – 2024-10-07 (×3): 12.5 mg via ORAL
  Filled 2024-10-05 (×4): qty 1

## 2024-10-05 MED ORDER — DEXAMETHASONE SODIUM PHOSPHATE 4 MG/ML IJ SOLN
4.0000 mg | Freq: Four times a day (QID) | INTRAMUSCULAR | Status: AC
  Administered 2024-10-05 – 2024-10-06 (×4): 4 mg via INTRAVENOUS
  Filled 2024-10-05 (×4): qty 1

## 2024-10-05 MED ORDER — PROCHLORPERAZINE EDISYLATE 10 MG/2ML IJ SOLN
5.0000 mg | Freq: Once | INTRAMUSCULAR | Status: AC
  Administered 2024-10-05: 5 mg via INTRAVENOUS
  Filled 2024-10-05: qty 2

## 2024-10-05 MED ORDER — METHOCARBAMOL 1000 MG/10ML IJ SOLN
500.0000 mg | Freq: Once | INTRAMUSCULAR | Status: AC
  Administered 2024-10-05: 500 mg via INTRAVENOUS
  Filled 2024-10-05: qty 10

## 2024-10-05 MED ORDER — IOHEXOL 350 MG/ML SOLN
75.0000 mL | Freq: Once | INTRAVENOUS | Status: AC | PRN
  Administered 2024-10-05: 75 mL via INTRAVENOUS

## 2024-10-05 NOTE — Progress Notes (Signed)
 OT Cancellation Note  Patient Details Name: Abigail Harding MRN: [id number] DOB: 1991/09/13   Cancelled Treatment:    Reason Eval/Treat Not Completed: Other (comment) (per order notes, await ortho consult)  Abigail Harding, OTD, OTR/L [facility] [phone]   Abigail Harding 10/05/2024, 6:52 AM

## 2024-10-05 NOTE — Consult Note (Signed)
 Providing Compassionate, Quality Care - Together  Neurosurgery Consult  Referring physician: EDP Reason for referral: LBP  History of Present Illness:  Abigail Harding is a 33 y.o. female with a history of L4-5 hemilami/microdisc x2 by [facility] with chronic LBP and LE radiculopathy. 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 She was lifting a bag of dog food yesterday when she noticed and acute increase in her chronic LBP and LLE weakness which brought her to the ED. No B&B dysfunction, SA, UE symptoms.    PE: BP (!) 101/48 (BP Location: Right Arm)   Pulse 62   Temp 97.9 F (36.6 C) (Oral)   Resp 16   Ht 5' 4 (1.626 m)   Wt 59 kg   SpO2 99%   BMI 22.31 kg/m   Awake, alert, oriented Speech fluent, appropriate Sitting at bedside commode BUE and RLE strength/sensation grossly intact LLE 4/5 PF/DF, limited exam proximally d/t pain. Sensation subjectively decreased in nonanatomic distribution.   MR THORACIC SPINE WO CONTRAST Result Date: 10/04/2024 EXAM: MR Thoracic Spine without 10/04/2024 08:06:26 PM TECHNIQUE: Multiplanar multisequence MRI of the thoracic spine was performed without the administration of intravenous contrast. COMPARISON: None available CLINICAL HISTORY: Mid to low back pain, acute left leg weakness. FINDINGS: BONES AND ALIGNMENT: Normal alignment. Normal vertebral body heights. Marrow signal is unremarkable. SPINAL CORD: Normal spinal cord volume. Normal spinal cord signal. SOFT TISSUES: Unremarkable. DEGENERATIVE CHANGES: No significant disc herniation. No spinal canal stenosis or neural foraminal narrowing. IMPRESSION: 1. No spinal canal stenosis or neural foraminal narrowing in the thoracic spine. Normal examination. Electronically signed by: Franky Stanford MD 10/04/2024 08:32 PM EST RP Workstation: [id number]   MR Lumbar Spine W Wo Contrast Result Date: 10/04/2024 CLINICAL DATA:  Low back pain.  Acute leg  weakness. EXAM: MRI LUMBAR SPINE WITHOUT AND WITH CONTRAST TECHNIQUE: Multiplanar and multiecho pulse sequences of the lumbar spine were obtained without and with intravenous contrast. CONTRAST:  6mL GADAVIST  GADOBUTROL  1 MMOL/ML IV SOLN COMPARISON:  CT 01/11/2024.  MRI 04/11/2023. FINDINGS: Segmentation:  5 lumbar type vertebral bodies. Alignment:  No malalignment. Vertebrae: No fracture or focal bone lesion. Mild endplate edematous change at L4-5 could relate to low back pain. Conus medullaris and cauda equina: Conus extends to the L1 level. Conus and cauda equina appear normal. Paraspinal and other soft tissues: Negative Disc levels: No abnormality from T11-12 through L3-4. L4-5: Previous right hemilaminectomy. Shallow disc protrusion with a focal herniation centrally and just to the right of midline with slight caudal down turning. This is slightly larger than was seen in May of 2024. This results in lateral recess narrowing on the right which could possibly affect the L5 nerve. L5-S1: Normal IMPRESSION: 1. L4-5: Previous right hemilaminectomy. Shallow disc protrusion with a focal herniation centrally and just to the right of midline with slight caudal down turning. This is slightly larger than was seen in May of 2024. This results in lateral recess narrowing on the right which could possibly affect the right L5 nerve. 2. Mild endplate edematous change at L4-5 could relate to low back pain. Electronically Signed   By: Oneil Officer M.D.   On: 10/04/2024 14:56      Impression/Assessment:  This is a 33 yo female with h/o L4-5 lami/microdiscectomy x2 with [facility] with an aggravation of chronic LBP and LE radiculopathy with LLE weakness without structural explanation on imaging.   Plan:  -Could consider adding Decadron  4mg  Q6H  w/ taper upon discharge -Continue Gabapentin , could trial transition to Lyrica .  -F/u outpt with her [facility].   Jonavon Trieu CAYLIN Nakari Bracknell, PA-C
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
## 2024-10-05 NOTE — H&P (Incomplete)
 History and Physical    Abigail Harding:[id number] DOB: 07-Sep-1991 DOA: 10/04/2024  PCP: [facility] Patient coming from: {Point_of_Origin:[address]}  Chief Complaint: ***  HPI: [name] is a 33 y.o. female with medical history significant of ***  ED Course:  In the ER, BP, HR, RR, O2 saturation,  and Tmax. Cbc demonstrated wbc,  hb/hct, and platelet. Chemistry demonstrated Na, K, Cl, bicarb, Bun/Cr and glucose. Anion gap. Initial troponin was.  CXR demonstrated no acute cardiopulmonary findings. Urinalysis demonstrated. EKG findings for the patient were the following.  Review of Systems:  All systems reviewed and apart from history of presenting illness, are negative.  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 Her smoking use included cigarettes. She has never used smokeless tobacco. She reports current alcohol  use. She reports current drug use. Drug: Marijuana.  Allergies  Allergen Reactions   Hydrocodone  Itching   Ketorolac  Itching and Nausea And Vomiting   Morphine And Codeine Itching    Tramadol Itching, Nausea And Vomiting and Other (See Comments)    cramping   Venlafaxine  Other (See Comments)    During taper of medication. Withdrawal symptoms     Betamethasone Swelling   Flonase  [Fluticasone ] Other (See Comments)    Pt states flonase  made her nose burn and her tongue turn purple.   Zofran  [Ondansetron ] Itching    nightmares   Hydroxyzine  Anxiety    The 25 mg tablets were fine, but when she took the 50 mg tablets at home she had adverse effects. Sweating    Family History  Problem Relation Age of Onset   Diabetes Mother    Diabetes Maternal Grandmother    Heart disease Maternal Grandmother        GRT ALSO   Pancreatic cancer Paternal Grandmother 37       BRIP1 gene mutation   Prostate cancer Other        MGF's brother; ? mets   Pancreatic cancer Other        great great grandmother on maternal side   Breast cancer Paternal Great-grandmother        PGM's mother; dx >50?   Ovarian cancer Neg Hx    Colon cancer Neg Hx     Prior to Admission medications   Medication Sig Start Date End Date Taking? Authorizing Provider  albuterol (VENTOLIN HFA) 108 (90 Base) MCG/ACT inhaler Inhale 2 puffs into the lungs daily as needed  for shortness of breath. 05/15/21   [provider]  busPIRone  (BUSPAR ) 7.5 MG tablet Take 7.5 mg by mouth 3 (three) times daily. 10/30/23   [provider]  clonazePAM  (KLONOPIN ) 0.5 MG tablet Take 1 tablet (0.5 mg total) by mouth daily as needed (anxiety). 02/22/22   Milissa Tod PARAS, MD  cyclobenzaprine  (FLEXERIL ) 5 MG tablet Take 1 tablet (5 mg total) by mouth 3 (three) times daily as needed for muscle spasms. Patient taking differently: Take 5 mg by mouth daily as needed for muscle spasms. 02/20/22   Nivia Colon, PA-C  dicyclomine  (BENTYL ) 20 MG tablet Take 1 tablet (20 mg total) by mouth 2 (two) times daily for 7 days. 08/14/23 08/21/23  Soto, Johana, PA-C  DONNATAL 16.2 MG tablet Take 1 tablet by mouth 3 (three) times daily as  needed. 08/16/23   [provider]  Drospirenone  (SLYND ) 4 MG TABS Take 1 tablet by mouth daily. Pt skips placebo pills and continues to next pack in order to skip period 09/30/22   Doretha Folks, MD  FLUoxetine  (PROZAC ) 20 MG capsule Take 20 mg by mouth daily.    [provider]  fluticasone  (FLONASE ) 50 MCG/ACT nasal spray Place 1 spray into the nose daily as needed.    [provider]  hyoscyamine  (LEVSIN AMIEL) 0.125 MG SL tablet Place 1 tablet (0.125 mg total) under the tongue every 4 (four) hours as needed. 08/10/23   Ula Prentice SAUNDERS, MD  INDERAL LA 80 MG 24 hr capsule Take 80 mg by mouth daily. 08/16/23   [provider]  lidocaine  (LIDODERM ) 5 % Place 1 patch onto the skin daily as needed (pain). Remove & Discard patch within 12 hours or as directed by MD    [provider]  magic mouthwash (lidocaine , diphenhydrAMINE , alum & mag hydroxide) suspension Swish and swallow 5 mLs 4 (four) times daily as needed for mouth pain. 01/11/24   Doretha Folks, MD  metoCLOPramide  (REGLAN ) 10 MG tablet Take 1 tablet (10 mg total) by mouth every 6 (six) hours as needed for nausea or vomiting. 12/19/22   Dreama Longs, MD  Na Sulfate-K Sulfate-Mg Sulf 17.5-3.13-1.6 GM/177ML SOLN Take 177 mLs by mouth. 11/05/23   [provider]  oxyCODONE -acetaminophen  (PERCOCET/ROXICET) 5-325 MG tablet Take 1 tablet by mouth every 6 (six) hours as needed for severe pain (pain score 7-10). 01/11/24   Doretha Folks, MD  pantoprazole  (PROTONIX ) 40 MG tablet Take 1 tablet (40 mg total) by mouth daily for 14 days. 12/19/22 01/02/23  Dreama Longs, MD  Polyvinyl Alcohol -Povidone (REFRESH OP) Place 1 drop into both eyes 3 (three) times daily as needed (dry eyes).    [provider]  predniSONE  (DELTASONE ) 20 MG tablet 3 tabs po day one, then 2 tabs daily x 4 days 02/20/22   Nivia Colon, PA-C  promethazine  (PHENERGAN ) 25 MG tablet Take 1 tablet (25 mg total) by mouth  every 6 (six) hours as needed for nausea or vomiting. 01/11/24   Doretha Folks, MD  promethazine -dextromethorphan  (PROMETHAZINE -DM) 6.25-15 MG/5ML syrup Take 5 mLs by mouth 4 (four) times daily as needed for cough. 09/04/23   [provider]  QUEtiapine  (SEROQUEL  XR) 50 MG TB24 24 hr tablet Take 50 mg by mouth at bedtime. 10/26/23   [provider]  rizatriptan (MAXALT-MLT) 10 MG disintegrating tablet Take 10 mg by mouth as needed for migraine. 10/30/23   [provider]  tamsulosin (FLOMAX) 0.4 MG CAPS capsule Take 0.4 mg by mouth every evening. 08/13/23  [provider]  tiZANidine (ZANAFLEX) 2 MG tablet Take 4 mg by mouth every 8 (eight) hours as needed.    [provider]  traZODone  (DESYREL ) 100 MG tablet Take 100 mg by mouth at bedtime as needed.    [provider]  traZODone  (DESYREL ) 50 MG tablet Take 0.5 tablets (25 mg total) by mouth at bedtime as needed for sleep. 02/22/22   Milissa Tod PARAS, MD    Physical Exam: Vitals:   10/04/24 1057 10/04/24 1106 10/04/24 1546 10/04/24 2130  BP: 96/70  (!) 105/55 104/64  Pulse: 62  71 87  Resp: 18  14 16   Temp: 98.2 F (36.8 C)  98.2 F (36.8 C)   TempSrc: Oral  Oral   SpO2: 99%  99% 100%  Weight:  59 kg    Height:  5' 4 (1.626 m)      Physical Exam Constitutional:      General: He is not in acute distress.    Appearance: Normal appearance.  HENT:     Head: Normocephalic and atraumatic.  Eyes:     Extraocular Movements: Extraocular movements intact.     Conjunctiva/sclera: Conjunctivae normal.     Pupils: Pupils are equal, round, and reactive to light.  Cardiovascular:     Rate and Rhythm: Normal rate and regular rhythm.     Pulses: Normal pulses.     Heart sounds: Normal heart sounds.  Pulmonary:     Effort: Pulmonary effort is normal. No respiratory distress.     Breath sounds: Normal breath sounds. No wheezing, rhonchi or rales.  Abdominal:     General: Abdomen is  flat. Bowel sounds are normal. There is no distension.     Palpations: Abdomen is soft.     Tenderness: There is no abdominal tenderness.  Musculoskeletal:        General: No deformity. Normal range of motion.  Skin:    General: Skin is warm and dry.     Coloration: Skin is not jaundiced.  Neurological:     General: No focal deficit present.     Mental Status: He is alert and oriented to person, place, and time. Mental status is at baseline.   Labs on Admission: I have personally reviewed following labs and imaging studies  CBC: No results for input(s): WBC, NEUTROABS, HGB, HCT, MCV, PLT in the last 168 hours. Basic Metabolic Panel: No results for input(s): NA, K, CL, CO2, GLUCOSE, BUN, CREATININE, CALCIUM, MG, PHOS in the last 168 hours. GFR: CrCl cannot be calculated (Patient's most recent lab result is older than the maximum 21 days allowed.). Liver Function Tests: No results for input(s): AST, ALT, ALKPHOS, BILITOT, PROT, ALBUMIN in the last 168 hours. No results for input(s): LIPASE, AMYLASE in the last 168 hours. No results for input(s): AMMONIA in the last 168 hours. Coagulation Profile: No results for input(s): INR, PROTIME in the last 168 hours. Cardiac Enzymes: No results for input(s): CKTOTAL, CKMB, CKMBINDEX, TROPONINI in the last 168 hours. BNP (last 3 results) No results for input(s): PROBNP in the last 8760 hours. HbA1C: No results for input(s): HGBA1C in the last 72 hours. CBG: No results for input(s): GLUCAP in the last 168 hours. Lipid Profile: No results for input(s): CHOL, HDL, LDLCALC, TRIG, CHOLHDL, LDLDIRECT in the last 72 hours. Thyroid Function Tests: No results for input(s): TSH, T4TOTAL, FREET4, T3FREE, THYROIDAB in the last 72 hours. Anemia Panel: No results for input(s): VITAMINB12, FOLATE, FERRITIN, TIBC, IRON, RETICCTPCT in the last  72  hours. Urine analysis:    Component Value Date/Time   COLORURINE YELLOW 08/14/2023 0026   APPEARANCEUR HAZY (A) 08/14/2023 0026   LABSPEC 1.020 08/14/2023 0026   PHURINE 6.0 08/14/2023 0026   GLUCOSEU NEGATIVE 08/14/2023 0026   HGBUR NEGATIVE 08/14/2023 0026   BILIRUBINUR NEGATIVE 08/14/2023 0026   BILIRUBINUR neg 10/12/2017 0925   KETONESUR 5 (A) 08/14/2023 0026   PROTEINUR NEGATIVE 08/14/2023 0026   UROBILINOGEN 0.2 10/12/2017 0925   UROBILINOGEN 0.2 12/22/2014 1808   NITRITE NEGATIVE 08/14/2023 0026   LEUKOCYTESUR LARGE (A) 08/14/2023 0026    Radiological Exams on Admission: MR THORACIC SPINE WO CONTRAST Result Date: 10/04/2024 EXAM: MR Thoracic Spine without 10/04/2024 08:06:26 PM TECHNIQUE: Multiplanar multisequence MRI of the thoracic spine was performed without the administration of intravenous contrast. COMPARISON: None available CLINICAL HISTORY: Mid to low back pain, acute left leg weakness. FINDINGS: BONES AND ALIGNMENT: Normal alignment. Normal vertebral body heights. Marrow signal is unremarkable. SPINAL CORD: Normal spinal cord volume. Normal spinal cord signal. SOFT TISSUES: Unremarkable. DEGENERATIVE CHANGES: No significant disc herniation. No spinal canal stenosis or neural foraminal narrowing. IMPRESSION: 1. No spinal canal stenosis or neural foraminal narrowing in the thoracic spine. Normal examination. Electronically signed by: Franky Stanford MD 10/04/2024 08:32 PM EST RP Workstation: [id number]   MR Lumbar Spine W Wo Contrast Result Date: 10/04/2024 CLINICAL DATA:  Low back pain.  Acute leg weakness. EXAM: MRI LUMBAR SPINE WITHOUT AND WITH CONTRAST TECHNIQUE: Multiplanar and multiecho pulse sequences of the lumbar spine were obtained without and with intravenous contrast. CONTRAST:  6mL GADAVIST  GADOBUTROL  1 MMOL/ML IV SOLN COMPARISON:  CT 01/11/2024.  MRI 04/11/2023. FINDINGS: Segmentation:  5 lumbar type vertebral bodies. Alignment:  No malalignment. Vertebrae: No  fracture or focal bone lesion. Mild endplate edematous change at L4-5 could relate to low back pain. Conus medullaris and cauda equina: Conus extends to the L1 level. Conus and cauda equina appear normal. Paraspinal and other soft tissues: Negative Disc levels: No abnormality from T11-12 through L3-4. L4-5: Previous right hemilaminectomy. Shallow disc protrusion with a focal herniation centrally and just to the right of midline with slight caudal down turning. This is slightly larger than was seen in May of 2024. This results in lateral recess narrowing on the right which could possibly affect the L5 nerve. L5-S1: Normal IMPRESSION: 1. L4-5: Previous right hemilaminectomy. Shallow disc protrusion with a focal herniation centrally and just to the right of midline with slight caudal down turning. This is slightly larger than was seen in May of 2024. This results in lateral recess narrowing on the right which could possibly affect the right L5 nerve. 2. Mild endplate edematous change at L4-5 could relate to low back pain. Electronically Signed   By: Oneil Officer M.D.   On: 10/04/2024 14:56    EKG: Independently reviewed. ***  Assessment/Plan Principal Problem:   Lumbago     *** HIV screening*** The patient falls between the ages of 13-64 and should be screened for HIV, therefore HIV testing ordered.  DVT prophylaxis: ***  Code Status: *** Family Communication: ***  Disposition Plan: Status is: Observation {Observation:23811}   Consults called: *** Admission status: *** Level of care: Level of care: Med-Surg The medical decision making on this patient was of high complexity and the patient is at high risk for clinical deterioration, therefore this is a level 3 visit.***  The medical decision making is of moderate complexity, therefore this is a level 2 visit.***  Taiwo Fish K Sami Froh  MD [facility]  If 7PM-7AM, please contact night-coverage [email or web address]  10/05/2024, 12:01 AM

## 2024-10-05 NOTE — Assessment & Plan Note (Signed)
 Patient tells me that she has not been able to move or feel anything in her left leg since 8 AM on 11/22.  She reports that she felt a pop in her back after lifting the dog food bag evening of 11/21. Neurology has been consulted and they are aware

## 2024-10-05 NOTE — Hospital Course (Addendum)
 [name] is a 33 year old female with history of chronic low back pain with sciatica, who presented to the emergency department on 11/22 for chief concerns of back pain.  Patient was picking up grocery, large bag of dog food which caused to aggravate her chronic low back pain.  11/22: Admitted to [facility] for pain control.  11/23: Assumed care of patient. Dilaudid  0.5-1 mg every 2 hours as needed was discontinued and change to Dilaudid  1 mg q4h prn.   11/25: transitioned to prednisone  40 BID with meals for 3 days.  Reviewed overnight nursing notes.  Patient did not disclose her medications during pharmacy tech med reconciliation.  Then she took her p.o. medications and self administered them overnight and then let the nurse know what she did.  Nurse then informed pharmacy and patient's medication have been taken to pharmacy for safekeeping.  Her home medications have been resumed, buspirone  7.5 mg 3 times daily, fluoxetine  40 mg daily, quetiapine  150 mg nightly, birth control medication tablet daily, Robaxin  500 mg 3 times daily were resumed.  Oxycodone  5 mg every 4 hours as needed for moderate pain, severe pain for 36 more hours ordered.  Following this, patient can have oxycodone  5 mg every 6 hours as needed for moderate, severe pain, 2 more days.

## 2024-10-05 NOTE — Progress Notes (Addendum)
 PROGRESS NOTE  Abigail Harding  FMW:[id number] DOB: August 16, 1991 DOA: 10/04/2024 PCP: [facility]   Abigail Harding is a 33 year old female with history of chronic low back pain with sciatica, who presented to the emergency department on 11/22 for chief concerns of back pain.  Patient was picking up grocery, large bag of dog food which caused to aggravate her chronic low back pain.  11/22: Admitted to [facility] for pain control.  11/23: Assumed care of patient. Dilaudid  0.5-1 mg every 2 hours as needed was discontinued and change to Dilaudid  1 mg q4h prn.   Assessment & Plan:   Principal Problem:   Lumbago Active Problems:   Chronic low back pain   Sciatica   Generalized anxiety disorder   Weakness   Assessment and Plan:  * Lumbago Dilaudid  0.5-1 mg every 2 hours as needed was initiated by admitting provider 11/23: Dilaudid  changed to every 4 hours as needed Oxycodone  5 mg every 4 hours as needed for moderate pain Continue gabapentin  300 mg 3 times daily  Neurosurgery was consulted on admission and recommends Decadron  4 mg every 6 hours, 1 day ordered  Chronic low back pain PDMP reviewed Current no active prescriptions.  Most recent Rx are pregabalin  75 mg capsule, 60 capsules, 30-day supply written and filled on 07/25/2024.  Oxycodone  5 mg tablet, 10 tablets, 3-day supply written and filled on 04/07/2024.  Weakness Patient tells me that she has not been able to move or feel anything in her left leg since 8 AM on 11/22.  She reports that she felt a pop in her back after lifting the dog food bag evening of 11/21. Neurology has been consulted and they are aware  DVT prophylaxis: Enoxaparin  Code Status: Full code Family Communication: Updated boyfriend, Abigail Harding at bedside with patient's permission Disposition Plan: Pending clinical course Level of care: Med-Surg  Consultants:  Neurosurgery, neurology  Procedures:   None  Antimicrobials: None  Subjective:  At bedside, patient awake alert and oriented.  She does not appear to be in acute distress.  She appears tired.  She endorses persistent pain with difficulty getting up.  She endorses that her left leg has not moved since 8 AM yesterday.  She reports that she was carrying the big bag of dog food evening of 06/02/2024.  In the inability to move her left leg was the morning after that.  This and the acute back pain prompted her to present to the emergency department.  Objective: Vitals:   10/04/24 2130 10/05/24 0404 10/05/24 0547 10/05/24 0800  BP: 104/64 (!) 105/59 111/64 (!) 101/48  Pulse: 87 73 86 62  Resp: 16 (!) 30 18 16   Temp:  97.7 F (36.5 C) 98.2 F (36.8 C) 97.9 F (36.6 C)  TempSrc:  Oral Oral Oral  SpO2: 100% 96% 99% 99%  Weight:      Height:        Intake/Output Summary (Last 24 hours) at 10/05/2024 1220 Last data filed at 10/05/2024 0900 Gross per 24 hour  Intake 240 ml  Output --  Net 240 ml   Filed Weights   10/04/24 1106  Weight: 59 kg   Examination:  General exam: Appears calm and comfortable  Respiratory system: Clear to auscultation. Respiratory effort normal. Cardiovascular system: S1 & S2 heard, RRR. No JVD, murmurs, rubs, gallops or clicks. No pedal edema. Gastrointestinal system: Abdomen is nondistended, soft and nontender. No organomegaly or masses felt. Normal bowel sounds heard. Central nervous system:  Alert and oriented. No focal neurological deficits. Extremities: Symmetric 5 x 5 power.  In bilateral upper and right lower extremity.  Patient not moving her left lower extremity Skin: No rashes, lesions or ulcers Psychiatry: Judgement and insight appear normal.  Anxious mood.   Data Reviewed: I have personally reviewed following labs and imaging studies  CBC: Recent Labs  Lab 10/05/24 0021  WBC 10.3  HGB 12.8  HCT 37.9  MCV 90.7  PLT 207   Basic Metabolic Panel: Recent Labs  Lab  10/05/24 0021  CREATININE 0.87   GFR: Estimated Creatinine Clearance: 80.9 mL/min (by C-G formula based on SCr of 0.87 mg/dL).  Radiology Studies: MR THORACIC SPINE WO CONTRAST Result Date: 10/04/2024 EXAM: MR Thoracic Spine without 10/04/2024 08:06:26 PM TECHNIQUE: Multiplanar multisequence MRI of the thoracic spine was performed without the administration of intravenous contrast. COMPARISON: None available CLINICAL HISTORY: Mid to low back pain, acute left leg weakness. FINDINGS: BONES AND ALIGNMENT: Normal alignment. Normal vertebral body heights. Marrow signal is unremarkable. SPINAL CORD: Normal spinal cord volume. Normal spinal cord signal. SOFT TISSUES: Unremarkable. DEGENERATIVE CHANGES: No significant disc herniation. No spinal canal stenosis or neural foraminal narrowing. IMPRESSION: 1. No spinal canal stenosis or neural foraminal narrowing in the thoracic spine. Normal examination. Electronically signed by: Franky Stanford MD 10/04/2024 08:32 PM EST RP Workstation: [id number]   MR Lumbar Spine W Wo Contrast Result Date: 10/04/2024 CLINICAL DATA:  Low back pain.  Acute leg weakness. EXAM: MRI LUMBAR SPINE WITHOUT AND WITH CONTRAST TECHNIQUE: Multiplanar and multiecho pulse sequences of the lumbar spine were obtained without and with intravenous contrast. CONTRAST:  6mL GADAVIST  GADOBUTROL  1 MMOL/ML IV SOLN COMPARISON:  CT 01/11/2024.  MRI 04/11/2023. FINDINGS: Segmentation:  5 lumbar type vertebral bodies. Alignment:  No malalignment. Vertebrae: No fracture or focal bone lesion. Mild endplate edematous change at L4-5 could relate to low back pain. Conus medullaris and cauda equina: Conus extends to the L1 level. Conus and cauda equina appear normal. Paraspinal and other soft tissues: Negative Disc levels: No abnormality from T11-12 through L3-4. L4-5: Previous right hemilaminectomy. Shallow disc protrusion with a focal herniation centrally and just to the right of midline with slight caudal down  turning. This is slightly larger than was seen in May of 2024. This results in lateral recess narrowing on the right which could possibly affect the L5 nerve. L5-S1: Normal IMPRESSION: 1. L4-5: Previous right hemilaminectomy. Shallow disc protrusion with a focal herniation centrally and just to the right of midline with slight caudal down turning. This is slightly larger than was seen in May of 2024. This results in lateral recess narrowing on the right which could possibly affect the right L5 nerve. 2. Mild endplate edematous change at L4-5 could relate to low back pain. Electronically Signed   By: Oneil Officer M.D.   On: 10/04/2024 14:56   Scheduled Meds:  dexamethasone  (DECADRON ) injection  4 mg Intravenous Q6H   docusate sodium   100 mg Oral BID   enoxaparin  (LOVENOX ) injection  40 mg Subcutaneous Q24H   gabapentin   300 mg Oral TID   lidocaine   1 patch Transdermal Q24H   senna  1 tablet Oral BID    LOS: 0 days   Time spent: 50 minutes  Dr. Sherre [facility] If 7PM-7AM, please contact night-coverage 10/05/2024, 12:20 PM

## 2024-10-05 NOTE — Assessment & Plan Note (Addendum)
 PDMP reviewed Current no active prescriptions.  Most recent Rx are pregabalin  75 mg capsule, 60 capsules, 30-day supply written and filled on 07/25/2024.  Oxycodone  5 mg tablet, 10 tablets, 3-day supply written and filled on 04/07/2024.

## 2024-10-05 NOTE — Progress Notes (Signed)
 Orthopedic Tech Progress Note Patient Details:  [name] 03-29-1991 [id number] Delivered to bedside patient was asleep. Nurse can call if they need help applying it to the patient. Ortho Devices Type of Ortho Device: Thoracolumbar corset (TLSO) Ortho Device/Splint Location: T SPINE Ortho Device/Splint Interventions: Ordered   Post Interventions Patient Tolerated: Other (comment) Instructions Provided: Care of device  [name] 10/05/2024, 8:13 AM

## 2024-10-05 NOTE — Assessment & Plan Note (Addendum)
 Dilaudid  0.5-1 mg every 2 hours as needed was initiated by admitting provider 11/23: Dilaudid  changed to every 4 hours as needed Oxycodone  5 mg every 4 hours as needed for moderate pain Continue gabapentin  300 mg 3 times daily  Neurosurgery was consulted on admission and recommends Decadron  4 mg every 6 hours, 1 day ordered  11/4: Decadron  IV transition to p.o. every 6 hours, for 1 day.  We will continue to taper to prednisone  tomorrow.  IV pain medication has been discontinued.  Oxycodone  5 mg p.o. every 4 hours as needed for moderate pain and severe pain ordered.

## 2024-10-05 NOTE — ED Notes (Signed)
 Patient's left arm elevated with towels and ice pack applied to effected area.

## 2024-10-05 NOTE — Evaluation (Signed)
 Physical Therapy Evaluation Patient Details Name: Abigail Harding MRN: [id number] DOB: 1991/09/17 Today's Date: 10/05/2024  History of Present Illness  Admitted with Low Back Pain, weakness, and decr sensation LLE;  has a past medical history of ADHD (attention deficit hyperactivity disorder), Anemia, Anxiety, Asthma, Back pain, Bipolar 1 disorder (HCC), Endometriosis, Endometriosis, H/O Spinal surgery, Headache, History of appendectomy, History of fainting, History of kidney stones, Ovarian cyst, Parsonage-Turner syndrome, PID (pelvic inflammatory disease), and Seizures (HCC).  Clinical Impression   Pt admitted with above diagnosis. 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, and functional dependencies related to decr ability to use LLE, decr active movement on LLE;    Able to stand to RW with predominance of weight on RLE, and take a few forward and backward steps with RW, essentially keeping TWB with LLE; Pt reports her RLE is feeling better, gaining strength, and she is more confident using her RLE; She also indicated that using the spinal brace provided is helpful, and she feels more stable with it on; She needed Up to min assist with bed mobility and basic sit to stand and stand pivot transfers with RW;   While her symptoms are not consistent with imaging results, perhaps PT's role and biggest impact in her care is to highlight and celebrate her functional progress; Recommend Outpt PT follow up;   Pt currently with functional limitations due to the deficits listed below (see PT Problem List). Pt will benefit from skilled PT to increase their independence and safety with mobility to allow discharge to the venue listed below.           If plan is discharge home, recommend the  following: A little help with walking and/or transfers;A little help with bathing/dressing/bathroom;Assistance with cooking/housework;Help with stairs or ramp for entrance   Can travel by private vehicle        Equipment Recommendations Rolling walker (2 wheels);BSC/3in1 (Will consider wheelchair if necessary)  Recommendations for Other Services  OT consult    Functional Status Assessment Patient has had a recent decline in their functional status and demonstrates the ability to make significant improvements in function in a reasonable and predictable amount of time.     Precautions / Restrictions Precautions Precautions: Fall Recall of Precautions/Restrictions: Intact Required Braces or Orthoses: Spinal Brace Spinal Brace: Thoracolumbosacral orthotic Restrictions Weight Bearing Restrictions Per Provider Order: No      Mobility  Bed Mobility Overal bed mobility: Needs Assistance Bed Mobility: Sit to Supine       Sit to supine: Min assist   General bed mobility comments: Min assist to help LEs into bed; hooks R foot under LLE to raise LLE into bed    Transfers Overall transfer level: Needs assistance Equipment used: Rolling walker (2 wheels) Transfers: Sit to/from Stand, Bed to chair/wheelchair/BSC Sit to Stand: Min assist   Step pivot transfers: Min assist (simulated)       General transfer comment: Heavy, step-by-step cues for technique, hand placement, and sequencing; RLE with adequate strength to power up; took a few stesp frowad and back with RW to simulate step pivot transfer with RW    Ambulation/Gait Ambulation/Gait assistance: Min assist           Pre-gait activities: With RW, stepped forward and backward  approx 2 feet; 2 bouts, once without brace adn nex time with brace    Stairs            Wheelchair Mobility     Tilt Bed    Modified Rankin (Stroke Patients Only)       Balance Overall balance assessment: Needs assistance    Sitting balance-Leahy Scale: Good       Standing balance-Leahy Scale: Poor Standing balance comment: Dependent on bil UE support                             Pertinent Vitals/Pain Pain Assessment Pain Assessment: Faces Faces Pain Scale: Hurts even more Pain Location: Low back, bil hips, LLE (with sit to stand, taking steps, and stand to sit) Pain Descriptors / Indicators: Aching, Tightness, Spasm, Burning Pain Intervention(s): RN gave pain meds during session    Home Living Family/patient expects to be discharged to:: Private residence Living Arrangements: Other relatives (reports roommate; does not give physical assist; pt is considering staying with boyfriend, who is supportive) Available Help at Discharge: Family;Friend(s);Available PRN/intermittently Type of Home: House Home Access: Stairs to enter Entrance Stairs-Rails: Right;Left;Can reach both Entrance Stairs-Number of Steps: 3   Home Layout: One level Home Equipment: None (Pt did not mention having any home equipment --  will need more info)      Prior Function Prior Level of Function : Independent/Modified Independent                     Extremity/Trunk Assessment   Upper Extremity Assessment Upper Extremity Assessment: Defer to OT evaluation (Pt describes a shoulder injury -- Rotator Cuff injury and 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))    Lower Extremity Assessment Lower Extremity Assessment: RLE deficits/detail;LLE deficits/detail RLE Deficits / Details: Active movement throughout, with enough strength to depend on RLE with standing and taking steps with RW LLE Deficits / Details: Minimal active movement noted; tends to use bil UEs/hands to move LLE, or hooks R foot under L to help LEs back into bed against gravity; tolerates what resembles Touchdown Weight Bearing in standing with RW       Communication   Communication Communication: No apparent  difficulties    Cognition Arousal: Alert Behavior During Therapy: WFL for tasks assessed/performed   PT - Cognitive impairments: No apparent impairments                         Following commands: Intact       Cueing Cueing Techniques: Verbal cues     General Comments General comments (skin integrity, edema, etc.): Pt was getting off of the BSC (which spilled) as PT entered; did not indicate any difficulty with voiding    Exercises     Assessment/Plan    PT Assessment Patient needs continued PT services  PT Problem List Decreased strength;Decreased range of motion;Decreased activity tolerance;Decreased balance;Decreased mobility;Decreased coordination;Decreased cognition;Decreased knowledge of use of DME;Decreased safety awareness;Decreased knowledge of precautions;Pain;Impaired sensation;Impaired tone       PT Treatment Interventions DME instruction;Gait training;Stair training;Functional mobility training;Therapeutic activities;Therapeutic exercise;Balance training;Neuromuscular re-education;Patient/family education;Wheelchair mobility training;Manual techniques    PT Goals (Current goals can be found in the Care Plan section)  Acute Rehab PT Goals Patient Stated Goal: less pain; more confidence in moving PT Goal Formulation: With patient Time For Goal Achievement: 10/19/24 Potential to Achieve Goals: Good    Frequency Min 2X/week  Co-evaluation               AM-PAC PT 6 Clicks Mobility  Outcome Measure Help needed turning from your back to your side while in a flat bed without using bedrails?: A Little Help needed moving from lying on your back to sitting on the side of a flat bed without using bedrails?: A Little Help needed moving to and from a bed to a chair (including a wheelchair)?: A Little Help needed standing up from a chair using your arms (e.g., wheelchair or bedside chair)?: A Little Help needed to walk in hospital room?: A  Little Help needed climbing 3-5 steps with a railing? : A Lot 6 Click Score: 17    End of Session Equipment Utilized During Treatment: Back brace Activity Tolerance: Patient tolerated treatment well Patient left: in bed;with call bell/phone within reach;with family/visitor present Nurse Communication: Mobility status PT Visit Diagnosis: Unsteadiness on feet (R26.81);Other abnormalities of gait and mobility (R26.89);Muscle weakness (generalized) (M62.81);Difficulty in walking, not elsewhere classified (R26.2)    Time: 8469-8391 PT Time Calculation (min) (ACUTE ONLY): 38 min   Charges:   PT Evaluation $PT Eval Moderate Complexity: 1 Mod PT Treatments $Gait Training: 8-22 mins $Therapeutic Activity: 8-22 mins PT General Charges $$ ACUTE PT VISIT: 1 Visit         Silvano Currier, PT  Acute Rehabilitation Services Office [phone] Secure Chat welcomed   Silvano VEAR Currier 10/05/2024, 4:44 PM

## 2024-10-05 NOTE — Progress Notes (Signed)
 Patient arrived in the unit accompanied by tech, in pain while transfering in the bed with complains of leg cramps on left leg.

## 2024-10-06 DIAGNOSIS — M5442 Lumbago with sciatica, left side: Secondary | ICD-10-CM | POA: Diagnosis not present

## 2024-10-06 MED ORDER — DIPHENHYDRAMINE HCL 50 MG/ML IJ SOLN
25.0000 mg | Freq: Once | INTRAMUSCULAR | Status: AC
  Administered 2024-10-06: 25 mg via INTRAVENOUS
  Filled 2024-10-06: qty 1

## 2024-10-06 MED ORDER — OXYCODONE HCL 5 MG PO TABS
5.0000 mg | ORAL_TABLET | ORAL | Status: DC | PRN
  Administered 2024-10-06 – 2024-10-07 (×4): 5 mg via ORAL
  Filled 2024-10-06 (×4): qty 1

## 2024-10-06 MED ORDER — SODIUM CHLORIDE 0.9 % IV BOLUS
500.0000 mL | Freq: Once | INTRAVENOUS | Status: AC
  Administered 2024-10-06: 500 mL via INTRAVENOUS

## 2024-10-06 MED ORDER — LORAZEPAM 0.5 MG PO TABS
0.2500 mg | ORAL_TABLET | Freq: Once | ORAL | Status: AC
  Administered 2024-10-06: 0.25 mg via ORAL
  Filled 2024-10-06: qty 1

## 2024-10-06 MED ORDER — DEXAMETHASONE 4 MG PO TABS
4.0000 mg | ORAL_TABLET | Freq: Four times a day (QID) | ORAL | Status: AC
  Administered 2024-10-06 – 2024-10-07 (×4): 4 mg via ORAL
  Filled 2024-10-06 (×5): qty 1

## 2024-10-06 NOTE — Progress Notes (Signed)
 PROGRESS NOTE  Abigail Harding  FMW:[id number] DOB: [birth year] DOA: 10/04/2024 PCP: [facility]   [name] is a 33 year old female with history of chronic low back pain with sciatica, who presented to the emergency department on 11/22 for chief concerns of back pain.  Patient was picking up grocery, large bag of dog food which caused to aggravate her chronic low back pain.  11/22: Admitted to [facility] for pain control.  11/23: Assumed care of patient. Dilaudid  0.5-1 mg every 2 hours as needed was discontinued and change to Dilaudid  1 mg q4h prn.   Assessment & Plan:   Principal Problem:   Lumbago Active Problems:   Chronic low back pain   Sciatica   Generalized anxiety disorder   Weakness   Assessment and Plan:  * Lumbago Dilaudid  0.5-1 mg every 2 hours as needed was initiated by admitting provider 11/23: Dilaudid  changed to every 4 hours as needed Oxycodone  5 mg every 4 hours as needed for moderate pain Continue gabapentin  300 mg 3 times daily  Neurosurgery was consulted on admission and recommends Decadron  4 mg every 6 hours, 1 day ordered  11/4: Decadron  IV transition to p.o. every 6 hours, for 1 day.  We will continue to taper to prednisone  tomorrow.  IV pain medication has been discontinued.  Oxycodone  5 mg p.o. every 4 hours as needed for moderate pain and severe pain ordered.  Chronic low back pain PDMP reviewed Current no active prescriptions.  Most recent Rx are pregabalin  75 mg capsule, 60 capsules, 30-day supply written and filled on 07/25/2024.  Oxycodone  5 mg tablet, 10 tablets, 3-day supply written and filled on 04/07/2024.  Weakness Patient tells me that she has not been able to move or feel anything in her left leg since 8 AM on 11/22.  She reports that she felt a pop in her back after lifting the dog food bag evening of 11/21. Neurology has been consulted and they are aware  DVT prophylaxis: Enoxaparin  Code  Status: Full code Family Communication: No Disposition Plan: Pending clinical course Level of care: Med-Surg  Consultants:  Neurosurgery, neurology, PT, OT, TOC  Procedures:  No  Antimicrobials: No  Subjective:  At bedside, patient alert and oriented x 3.  Patient is sitting in hospital chair in her room and does not appear to be in acute distress.  Objective: Vitals:   10/05/24 2014 10/06/24 0456 10/06/24 0825 10/06/24 1001  BP: 105/62 (!) 89/44 (!) 99/51 117/66  Pulse: 66 66 82   Resp: 18 16    Temp: 98.7 F (37.1 C) 98.8 F (37.1 C)    TempSrc: Oral Oral    SpO2: 98% 98%    Weight:      Height:        Intake/Output Summary (Last 24 hours) at 10/06/2024 1336 Last data filed at 10/06/2024 1000 Gross per 24 hour  Intake 513.14 ml  Output --  Net 513.14 ml   Filed Weights   10/04/24 1106  Weight: 59 kg   Examination:  General exam: Appears calm and comfortable  Respiratory system: Clear to auscultation. Respiratory effort normal. Cardiovascular system: S1 & S2 heard, RRR. No JVD, murmurs, rubs, gallops or clicks. No pedal edema. Gastrointestinal system: Abdomen is nondistended, soft and nontender. No organomegaly or masses felt. Normal bowel sounds heard. Central nervous system: Alert and oriented. No focal neurological deficits. Extremities: Strength is decreased on the left lower extremity.  Strength is overall decreased in bilateral lower  extremities. Skin: No rashes, lesions or ulcers Psychiatry: Judgement and insight appear normal. Mood & affect appropriate.   Data Reviewed: I have personally reviewed following labs and imaging studies  CBC: Recent Labs  Lab 10/05/24 0021  WBC 10.3  HGB 12.8  HCT 37.9  MCV 90.7  PLT 207   Basic Metabolic Panel: Recent Labs  Lab 10/05/24 0021  CREATININE 0.87   GFR: Estimated Creatinine Clearance: 80.9 mL/min (by C-G formula based on SCr of 0.87 mg/dL).  Radiology Studies: CT HIP LEFT W CONTRAST Result  Date: 10/06/2024 CLINICAL DATA:  Hip pain, chronic, no prior imaging Chronic low back pain with left lower extremity radiculopathy/weakness. Previous back surgery. EXAM: CT OF THE LOWER LEFT EXTREMITY WITH CONTRAST TECHNIQUE: Multidetector CT imaging of the pelvis and left hip was performed according to the standard protocol following intravenous contrast administration. RADIATION DOSE REDUCTION: This exam was performed according to the departmental dose-optimization program which includes automated exposure control, adjustment of the mA and/or kV according to patient size and/or use of iterative reconstruction technique. CONTRAST:  75mL OMNIPAQUE  IOHEXOL  350 MG/ML SOLN COMPARISON:  Abdominopelvic CT 11/15/2023. FINDINGS: Bones/Joint/Cartilage No evidence of acute fracture, dislocation or femoral head osteonecrosis. Hip joint spaces are preserved, and there are no significant joint effusions. The sacroiliac joints appear normal. Postsurgical changes in the lower lumbar spine. Ligaments Suboptimally assessed by CT. Muscles and Tendons The pelvic muscles are symmetric, without evidence of atrophy or abnormal enhancement. Soft tissues No periarticular inflammatory changes or fluid collections are identified. Mildly increased density within the bladder lumen, likely related to recent gadolinium administration for MRI. No suspicious adnexal findings. IMPRESSION: 1. No acute findings or explanation for the patient's symptoms. 2. No significant hip arthropathy or periarticular inflammatory changes. 3. Postsurgical changes in the lower lumbar spine. Electronically Signed   By: Elsie Perone M.D.   On: 10/06/2024 08:04   MR THORACIC SPINE WO CONTRAST Result Date: 10/04/2024 EXAM: MR Thoracic Spine without 10/04/2024 08:06:26 PM TECHNIQUE: Multiplanar multisequence MRI of the thoracic spine was performed without the administration of intravenous contrast. COMPARISON: None available CLINICAL HISTORY: Mid to low back  pain, acute left leg weakness. FINDINGS: BONES AND ALIGNMENT: Normal alignment. Normal vertebral body heights. Marrow signal is unremarkable. SPINAL CORD: Normal spinal cord volume. Normal spinal cord signal. SOFT TISSUES: Unremarkable. DEGENERATIVE CHANGES: No significant disc herniation. No spinal canal stenosis or neural foraminal narrowing. IMPRESSION: 1. No spinal canal stenosis or neural foraminal narrowing in the thoracic spine. Normal examination. Electronically signed by: Franky Stanford MD 10/04/2024 08:32 PM EST RP Workstation: [id number]   MR Lumbar Spine W Wo Contrast Result Date: 10/04/2024 CLINICAL DATA:  Low back pain.  Acute leg weakness. EXAM: MRI LUMBAR SPINE WITHOUT AND WITH CONTRAST TECHNIQUE: Multiplanar and multiecho pulse sequences of the lumbar spine were obtained without and with intravenous contrast. CONTRAST:  6mL GADAVIST  GADOBUTROL  1 MMOL/ML IV SOLN COMPARISON:  CT 01/11/2024.  MRI 04/11/2023. FINDINGS: Segmentation:  5 lumbar type vertebral bodies. Alignment:  No malalignment. Vertebrae: No fracture or focal bone lesion. Mild endplate edematous change at L4-5 could relate to low back pain. Conus medullaris and cauda equina: Conus extends to the L1 level. Conus and cauda equina appear normal. Paraspinal and other soft tissues: Negative Disc levels: No abnormality from T11-12 through L3-4. L4-5: Previous right hemilaminectomy. Shallow disc protrusion with a focal herniation centrally and just to the right of midline with slight caudal down turning. This is slightly larger than was seen in May of 2024.  This results in lateral recess narrowing on the right which could possibly affect the L5 nerve. L5-S1: Normal IMPRESSION: 1. L4-5: Previous right hemilaminectomy. Shallow disc protrusion with a focal herniation centrally and just to the right of midline with slight caudal down turning. This is slightly larger than was seen in May of 2024. This results in lateral recess narrowing on the  right which could possibly affect the right L5 nerve. 2. Mild endplate edematous change at L4-5 could relate to low back pain. Electronically Signed   By: Oneil Officer M.D.   On: 10/04/2024 14:56   Scheduled Meds:  dexamethasone   4 mg Oral Q6H   docusate sodium   100 mg Oral BID   enoxaparin  (LOVENOX ) injection  40 mg Subcutaneous Q24H   gabapentin   300 mg Oral TID   lidocaine   1 patch Transdermal Q24H   senna  1 tablet Oral BID    LOS: 1 day   Time spent: 35 minutes  Dr. Sherre [facility] If 7PM-7AM, please contact night-coverage 10/06/2024, 1:36 PM

## 2024-10-06 NOTE — TOC Initial Note (Addendum)
 Transition of Care (TOC) - Initial/Assessment Note   Spoke to patient at bedside. Patient from home with roommate, plans on discharging to her boyfriends home.   PT recommending rolling walker, bedside commode, and possibly a wheelchair.   Patient received a rolling walker through insurance two years ago but states not sure where walker is. NCM explianed insurance only covers walker every 5 years , NCM will order but [employer] will give her a piece if insurance does not cover. Could be cheaper elsewhere. PAtient voiced understanding and in agreement. In the meantime she will ask her mom to look for walker.  Patient already has a wheelchair at home. Patient will need bedside commode.   Ordered DME with Jermain with [employer].   Patient's mother has found walker and wheel chair   PT recommending OP PT. Patient wants to go to [facility] [phone] . NCM called same spoke to [place] will need order with DX , DX Code and eval and treat on it and MD signature and fax to [phone] . Signed referral received and faxed   NCM entered orders for OP PT, walker and bedside commode and secure chatted MD to sign.   Patient Details  Name: Abigail Harding MRN: [id number] Date of Birth: 1991-03-12  Transition of Care [facility]) CM/SW Contact:    Stephane Powell Jansky, RN Phone Number: 10/06/2024, 10:00 AM  Clinical Narrative:                   Expected Discharge Plan: Home/Self Care Barriers to Discharge: Continued Medical Work up   Patient Goals and CMS Choice Patient states their goals for this hospitalization and ongoing recovery are:: to return to home CMS Medicare.gov Compare Post Acute Care list provided to:: Patient Choice offered to / list presented to : Patient [place] ownership interest in [facility].provided to:: Patient    Expected Discharge Plan and Services   Discharge Planning Services: CM Consult Post Acute Care Choice: Durable Medical Equipment Living  arrangements for the past 2 months: Single Family Home                 DME Arranged: 3-N-1, Walker rolling DME Agency: [employer] Date DME Agency Contacted: 10/06/24 Time DME Agency Contacted: [phone] Representative spoke with at DME Agency: [place] HH Arranged: NA HH Agency: NA        Prior Living Arrangements/Services Living arrangements for the past 2 months: Single Family Home Lives with:: Roommate Patient language and need for interpreter reviewed:: Yes Do you feel safe going back to the place where you live?: Yes      Need for Family Participation in Patient Care: Yes (Comment) Care giver support system in place?: Yes (comment) Current home services: DME Criminal Activity/Legal Involvement Pertinent to Current Situation/Hospitalization: No - Comment as needed  Activities of Daily Living   ADL Screening (condition at time of admission) Independently performs ADLs?: Yes (appropriate for developmental age) Is the patient deaf or have difficulty hearing?: No Does the patient have difficulty seeing, even when wearing glasses/contacts?: No Does the patient have difficulty concentrating, remembering, or making decisions?: No  Permission Sought/Granted   Permission granted to share information with : Yes, Verbal Permission Granted     Permission granted to share info w AGENCY: [employer] [phone]        Emotional Assessment Appearance:: Appears stated age Attitude/Demeanor/Rapport: Engaged Affect (typically observed): Appropriate Orientation: : Oriented to  Self, Oriented to Place, Oriented to  Time, Oriented to Situation Alcohol  / Substance Use: Not Applicable Psych Involvement: No (comment)  Admission diagnosis:  Lumbago [M54.50] Left leg weakness [R29.898] Intractable pain [R52] Acute left-sided low back pain with left-sided sciatica [M54.42] Patient Active Problem List   Diagnosis Date Noted   Chronic low back pain 10/05/2024    Weakness 10/05/2024   Lumbago 10/04/2024   Genetic testing 06/04/2023   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   Sciatica 02/21/2022   Radicular low back pain 02/20/2022   Hypokalemia 02/20/2022   Nonepileptic episode (HCC) 06/13/2021   Nausea with vomiting 06/13/2021   Syncope 06/13/2021   Health care maintenance 04/12/2018   Endometriosis determined by laparoscopy 01/02/2017   Transient loss of consciousness 12/31/2016   Headache 12/21/2016   Right lower quadrant abdominal pain 10/31/2016   Family history of endometriosis in first degree relative 10/31/2016   Round ligament pain 10/31/2016   Dysmenorrhea 10/31/2016   Pelvic pain 10/31/2016   Chronic pelvic pain in female 03/16/2016   Generalized anxiety disorder 03/02/2016   Oligo-ovulation 11/17/2015   PCP:  [facility] Pharmacy:   [id number] - [place], [place] - [address] [place] [address] Phone: [phone] Fax: [phone]     Social Drivers of Health (SDOH) Social History: SDOH Screenings   Food Insecurity: No Food Insecurity (10/05/2024)  Housing: Low Risk  (10/05/2024)  Transportation Needs: No Transportation Needs (10/05/2024)  Utilities: Not At Risk (10/05/2024)  Financial Resource Strain: Low Risk  (11/29/2022)   Received from [facility]  Tobacco Use: Medium Risk (10/04/2024)   SDOH Interventions:     Readmission Risk Interventions     No data to display

## 2024-10-06 NOTE — Progress Notes (Signed)
 Notified provider of BP, tried to get the pt up to use the restroom and eat/drink something. She wasn't interested in eating. I explained to her that she couldn't have pain meds because of how low her BP was. She expressed understanding. See MAR for new orders.

## 2024-10-06 NOTE — Plan of Care (Signed)

## 2024-10-06 NOTE — Progress Notes (Signed)
 OT Cancellation Note  Patient Details Name: JULIETTE STANDRE MRN: [id number] DOB: [birth year]   Cancelled Treatment:    Reason Eval/Treat Not Completed: Other (comment) (Per order will consult ortho for clearance prior, OT messaged ordering MD for clarification, no response. Ortho not following patient, nsgy cleared for acivity.)  Lucie JONETTA Kendall 10/06/2024, 12:03 PM

## 2024-10-06 NOTE — Evaluation (Signed)
 Occupational Therapy Evaluation Patient Details Name: Abigail Harding MRN: [id number] DOB: [birth year] Today's Date: 10/06/2024   History of Present Illness   Admitted with Low Back Pain, weakness, and decr sensation LLE;  has a past medical history of ADHD (attention deficit hyperactivity disorder), Anemia, Anxiety, Asthma, Back pain, Bipolar 1 disorder (HCC), Endometriosis, Endometriosis, H/O Spinal surgery, Headache, History of appendectomy, History of fainting, History of kidney stones, Ovarian cyst, Parsonage-Turner syndrome, PID (pelvic inflammatory disease), and Seizures (HCC).     Clinical Impressions Abigail Harding was evaluated s/p the above admission list. She is indep at baseline. Upon evaluation the pt was limited by pain, vigorous itching (allergic reaction?), spinal precautions and limited activity tolerance. Overall she is mobilizing short distances with up with up to min A and RW. Due to the deficits listed below the pt also needs up to mod A for LB ADLs due to spinal precautions, LLE pain and decreased LLE mobility. Pt will benefit from continued acute OT services, no follow up OT needed.      If plan is discharge home, recommend the following:   A little help with walking and/or transfers;A lot of help with bathing/dressing/bathroom;Assistance with cooking/housework;Assist for transportation;Help with stairs or ramp for entrance     Equipment Recommendations   BSC/3in1     Precautions/Restrictions   Precautions Precautions: Fall Recall of Precautions/Restrictions: Intact Required Braces or Orthoses: Spinal Brace Spinal Brace: Thoracolumbosacral orthotic Restrictions Weight Bearing Restrictions Per Provider Order: No     Mobility Bed Mobility Overal bed mobility: Needs Assistance Bed Mobility: Sidelying to Sit, Rolling   Sidelying to sit: Contact guard assist   Sit to supine: Supervision   General bed mobility comments: close CGA fro safety and pain  management, pt able to complete without physical assist    Transfers Overall transfer level: Needs assistance Equipment used: Rolling walker (2 wheels) Transfers: Sit to/from Stand Sit to Stand: Contact guard assist                  Balance Overall balance assessment: Needs assistance   Sitting balance-Leahy Scale: Good     Standing balance support: Bilateral upper extremity supported, During functional activity Standing balance-Leahy Scale: Fair Standing balance comment: statically                           ADL either performed or assessed with clinical judgement   ADL Overall ADL's : Needs assistance/impaired Eating/Feeding: Independent   Grooming: Set up;Sitting   Upper Body Bathing: Set up;Sitting   Lower Body Bathing: Minimal assistance;Sit to/from stand   Upper Body Dressing : Set up;Sitting   Lower Body Dressing: Moderate assistance;Sit to/from stand Lower Body Dressing Details (indicate cue type and reason): pt is unable to figure 4 her LLE due to pain Toilet Transfer: Minimal assistance;Ambulation;Rolling walker (2 wheels)   Toileting- Clothing Manipulation and Hygiene: Contact guard assist;Sitting/lateral lean       Functional mobility during ADLs: Minimal assistance;Rolling walker (2 wheels) General ADL Comments: assist for pain management, spinal precautions, balance and activity tolerance     Vision   Vision Assessment?: Wears glasses for driving;Wears glasses for reading     Perception Perception: Not tested       Praxis Praxis: Not tested       Pertinent Vitals/Pain Pain Assessment Pain Assessment: Faces Faces Pain Scale: Hurts even more Pain Location: back & LLE Pain Descriptors / Indicators: Aching, Tightness, Spasm, Burning Pain Intervention(s): Limited activity within  patient's tolerance, Monitored during session     Extremity/Trunk Assessment Upper Extremity Assessment Upper Extremity Assessment: Overall WFL for  tasks assessed   Lower Extremity Assessment Lower Extremity Assessment: Defer to PT evaluation       Communication Communication Communication: No apparent difficulties   Cognition Arousal: Alert Behavior During Therapy: WFL for tasks assessed/performed Cognition: No apparent impairments             OT - Cognition Comments: Overall WFL, good recall of spinal precautions. internally distracted by itching from a allergic reaction                 Following commands: Intact       Cueing  General Comments   Cueing Techniques: Verbal cues  VSS on RA - pt itching excessively           Home Living Family/patient expects to be discharged to:: Private residence Living Arrangements: Other relatives Available Help at Discharge: Family;Friend(s);Available PRN/intermittently Type of Home: House Home Access: Stairs to enter [employer] of Steps: 3 Entrance Stairs-Rails: Right;Left;Can reach both Home Layout: One level     Bathroom Shower/Tub: Walk-in shower;Tub/shower unit   Bathroom Toilet: Standard     Home Equipment: None          Prior Functioning/Environment Prior Level of Function : Independent/Modified Independent                            OT Goals(Current goals can be found in the care plan section)   ADL Goals Pt Will Perform Grooming: with modified independence;standing Pt Will Perform Lower Body Dressing: with modified independence;sit to/from stand Pt Will Transfer to Toilet: with modified independence;ambulating Additional ADL Goal #1: Pt will complete ADL session while independently following spinal precautions   OT Frequency:  Min 2X/week       AM-PAC OT 6 Clicks Daily Activity     Outcome Measure Help from another person eating meals?: None Help from another person taking care of personal grooming?: A Little Help from another person toileting, which includes using toliet, bedpan, or urinal?: A Little Help  from another person bathing (including washing, rinsing, drying)?: A Little Help from another person to put on and taking off regular upper body clothing?: A Little Help from another person to put on and taking off regular lower body clothing?: A Lot 6 Click Score: 18   End of Session Equipment Utilized During Treatment: Rolling walker (2 wheels);Back brace Nurse Communication: Mobility status  Activity Tolerance: Patient tolerated treatment well Patient left: in bed;with call bell/phone within reach  OT Visit Diagnosis: Unsteadiness on feet (R26.81);Other abnormalities of gait and mobility (R26.89);Muscle weakness (generalized) (M62.81);Pain                Time: 8683-8664 OT Time Calculation (min): 19 min Charges:  OT General Charges $OT Visit: 1 Visit OT Evaluation $OT Eval Low Complexity: 1 Low  Abigail Harding, OTR/L Acute Rehabilitation Services Office [phone] Secure Chat Communication Preferred   Abigail Harding 10/06/2024, 2:31 PM

## 2024-10-06 NOTE — Progress Notes (Signed)
       Overnight   NAME: Markella M Reardon MRN: [id number] DOB : 03-08-1991    Date of Service   10/06/2024   HPI/Events of Note    Notified by Nursing staff for patient stating to LPN that last night 11/23-11/24 she took ... all of my home meds ...  which she states included Lyrica , Fluoxetine , Quetiapine , Buspirone   without telling Nursing staff and her hospital prescribed meds as well.  Patient admitted to LPN of not telling Pharm Tech of these meds and then admitted to LPN tonight of self administered them.   Pt is not currently ordered these for admission. There is no stated attempt at self harm.  Nursing staff has acquired these medications and they are sent to Pharmacy for safe hold for patient.    Interventions/ Plan   Continue current Attending orders  Patient is advised to not self administer any medications while admitted.      Lynwood Kipper BSN MSNA MSN ACNPC-AG Acute Care Nurse Practitioner [facility]

## 2024-10-06 NOTE — Progress Notes (Signed)
 Physical Therapy Treatment Patient Details Name: Abigail Harding MRN: [id number] DOB: [birth year] Today's Date: 10/06/2024   History of Present Illness Admitted with Low Back Pain, weakness, and decr sensation LLE;  has a past medical history of ADHD (attention deficit hyperactivity disorder), Anemia, Anxiety, Asthma, Back pain, Bipolar 1 disorder (HCC), Endometriosis, Endometriosis, H/O Spinal surgery, Headache, History of appendectomy, History of fainting, History of kidney stones, Ovarian cyst, Parsonage-Turner syndrome, PID (pelvic inflammatory disease), and Seizures (HCC).    PT Comments  Pt resting in bed on arrival, agreeable to session and demonstrating good progress towards acute goals. Pt with noted improvement in LLE ROM and weight bearing tolerance with pt able to progress gait for x2 bouts with RW for support and grossly CGA for safety. Pt continues to be limited in safe mobility by decreased activity tolerance, impaired balance/postural reactions and pain. Pt was educated on continued walker use to maximize functional independence, safety, and decrease risk for falls as well as appropriate activity progression and importance of continued mobility. Pt continues to benefit from skilled PT services to progress toward functional mobility goals.     If plan is discharge home, recommend the following: A little help with walking and/or transfers;A little help with bathing/dressing/bathroom;Assistance with cooking/housework;Help with stairs or ramp for entrance   Can travel by private vehicle        Equipment Recommendations  Rolling walker (2 wheels);BSC/3in1 (Will consider wheelchair if necessary)    Recommendations for Other Services       Precautions / Restrictions Precautions Precautions: Fall Recall of Precautions/Restrictions: Intact Required Braces or Orthoses: Spinal Brace Spinal Brace: Thoracolumbosacral orthotic Restrictions Weight Bearing Restrictions Per Provider Order:  No     Mobility  Bed Mobility Overal bed mobility: Needs Assistance Bed Mobility: Sidelying to Sit   Sidelying to sit: HOB elevated, Used rails, Min assist       General bed mobility comments: light min A to elevate trunk from side lying    Transfers Overall transfer level: Needs assistance Equipment used: Rolling walker (2 wheels) Transfers: Sit to/from Stand, Bed to chair/wheelchair/BSC Sit to Stand: Contact guard assist           General transfer comment: good recall for hand placement, able to rise from EOB at lowest height and recliner with CGA, good eccentric control coming to sit    Ambulation/Gait Ambulation/Gait assistance: Contact guard assist (with chair folloq) Gait Distance (Feet): 35 Feet (+ 20') Assistive device: Rolling walker (2 wheels) Gait Pattern/deviations: Step-to pattern, Decreased stride length Gait velocity: decr     General Gait Details: pt ambulating with an antalgic step to pattern, heavy reliance on UE support on RW. Pt needing cues for upright posture and closer RW proximity. no LOB, chair follow provided as pt fatigues quickly.   Stairs             Wheelchair Mobility     Tilt Bed    Modified Rankin (Stroke Patients Only)       Balance Overall balance assessment: Needs assistance   Sitting balance-Leahy Scale: Good     Standing balance support: Bilateral upper extremity supported, During functional activity Standing balance-Leahy Scale: Poor Standing balance comment: Dependent on bil UE support                            Communication Communication Communication: No apparent difficulties  Cognition Arousal: Alert Behavior During Therapy: WFL for tasks assessed/performed   PT -  Cognitive impairments: No apparent impairments                         Following commands: Intact      Cueing Cueing Techniques: Verbal cues  Exercises      General Comments General comments (skin integrity,  edema, etc.): VSS on RA      Pertinent Vitals/Pain Pain Assessment Pain Assessment: Faces Faces Pain Scale: Hurts even more Pain Location: Low back, Pain Descriptors / Indicators: Aching, Tightness, Spasm, Burning Pain Intervention(s): RN gave pain meds during session, Monitored during session, Limited activity within patient's tolerance    Home Living                          Prior Function            PT Goals (current goals can now be found in the care plan section) Acute Rehab PT Goals Patient Stated Goal: less pain; more confidence in moving PT Goal Formulation: With patient Time For Goal Achievement: 10/19/24 Progress towards PT goals: Progressing toward goals    Frequency    Min 2X/week      PT Plan      Co-evaluation              AM-PAC PT 6 Clicks Mobility   Outcome Measure  Help needed turning from your back to your side while in a flat bed without using bedrails?: A Little Help needed moving from lying on your back to sitting on the side of a flat bed without using bedrails?: A Little Help needed moving to and from a bed to a chair (including a wheelchair)?: A Little Help needed standing up from a chair using your arms (e.g., wheelchair or bedside chair)?: A Little Help needed to walk in hospital room?: A Little Help needed climbing 3-5 steps with a railing? : A Lot 6 Click Score: 17    End of Session Equipment Utilized During Treatment: Back brace Activity Tolerance: Patient tolerated treatment well Patient left: with call bell/phone within reach;in chair Nurse Communication: Mobility status PT Visit Diagnosis: Unsteadiness on feet (R26.81);Other abnormalities of gait and mobility (R26.89);Muscle weakness (generalized) (M62.81);Difficulty in walking, not elsewhere classified (R26.2)     Time: 9042-8977 PT Time Calculation (min) (ACUTE ONLY): 25 min  Charges:    $Gait Training: 8-22 mins $Therapeutic Activity: 8-22 mins PT  General Charges $$ ACUTE PT VISIT: 1 Visit                     Regis Hinton R. PTA Acute Rehabilitation Services Office: [phone]   Therisa CHRISTELLA Boor 10/06/2024, 10:35 AM

## 2024-10-07 DIAGNOSIS — G4700 Insomnia, unspecified: Secondary | ICD-10-CM | POA: Insufficient documentation

## 2024-10-07 DIAGNOSIS — D72829 Elevated white blood cell count, unspecified: Secondary | ICD-10-CM

## 2024-10-07 DIAGNOSIS — M5442 Lumbago with sciatica, left side: Secondary | ICD-10-CM | POA: Diagnosis not present

## 2024-10-07 LAB — CBC
HCT: 39.8 % (ref 36.0–46.0)
Hemoglobin: 13.5 g/dL (ref 12.0–15.0)
MCH: 30.3 pg (ref 26.0–34.0)
MCHC: 33.9 g/dL (ref 30.0–36.0)
MCV: 89.4 fL (ref 80.0–100.0)
Platelets: 237 K/uL (ref 150–400)
RBC: 4.45 MIL/uL (ref 3.87–5.11)
RDW: 12.3 % (ref 11.5–15.5)
WBC: 14.2 K/uL — ABNORMAL HIGH (ref 4.0–10.5)
nRBC: 0 % (ref 0.0–0.2)

## 2024-10-07 LAB — BASIC METABOLIC PANEL WITH GFR
Anion gap: 13 (ref 5–15)
BUN: 10 mg/dL (ref 6–20)
CO2: 23 mmol/L (ref 22–32)
Calcium: 9.2 mg/dL (ref 8.9–10.3)
Chloride: 102 mmol/L (ref 98–111)
Creatinine, Ser: 0.75 mg/dL (ref 0.44–1.00)
GFR, Estimated: >60 mL/min (ref >60)
Glucose, Bld: 133 mg/dL — ABNORMAL HIGH (ref 70–99)
Potassium: 4.6 mmol/L (ref 3.5–5.1)
Sodium: 138 mmol/L (ref 135–145)

## 2024-10-07 MED ORDER — FLUOXETINE HCL 20 MG PO CAPS
40.0000 mg | ORAL_CAPSULE | Freq: Every day | ORAL | Status: DC
  Administered 2024-10-07 – 2024-10-08 (×2): 40 mg via ORAL
  Filled 2024-10-07 (×2): qty 2

## 2024-10-07 MED ORDER — OXYCODONE HCL 5 MG PO TABS: 5.0000 mg | ORAL_TABLET | ORAL | Status: DC | PRN

## 2024-10-07 MED ORDER — QUETIAPINE FUMARATE 50 MG PO TABS
150.0000 mg | ORAL_TABLET | Freq: Every day | ORAL | Status: DC
  Administered 2024-10-07: 150 mg via ORAL
  Filled 2024-10-07: qty 3

## 2024-10-07 MED ORDER — LORAZEPAM 1 MG PO TABS
1.0000 mg | ORAL_TABLET | Freq: Once | ORAL | Status: AC
  Administered 2024-10-07: 1 mg via ORAL
  Filled 2024-10-07: qty 1

## 2024-10-07 MED ORDER — METHOCARBAMOL 500 MG PO TABS
500.0000 mg | ORAL_TABLET | Freq: Three times a day (TID) | ORAL | Status: DC
  Administered 2024-10-07 (×3): 500 mg via ORAL
  Filled 2024-10-07 (×3): qty 1

## 2024-10-07 MED ORDER — BUSPIRONE HCL 5 MG PO TABS
7.5000 mg | ORAL_TABLET | Freq: Three times a day (TID) | ORAL | Status: DC
  Administered 2024-10-07 – 2024-10-08 (×4): 7.5 mg via ORAL
  Filled 2024-10-07 (×4): qty 2

## 2024-10-07 MED ORDER — DROSPIRENONE 4 MG PO TABS
4.0000 mg | ORAL_TABLET | Freq: Every day | ORAL | Status: DC
  Administered 2024-10-07 – 2024-10-08 (×2): 4 mg via ORAL
  Filled 2024-10-07 (×3): qty 1

## 2024-10-07 MED ORDER — LORAZEPAM 0.5 MG PO TABS
0.2500 mg | ORAL_TABLET | Freq: Four times a day (QID) | ORAL | Status: DC | PRN
  Administered 2024-10-07: 0.25 mg via ORAL
  Filled 2024-10-07: qty 1

## 2024-10-07 MED ORDER — DIPHENHYDRAMINE HCL 50 MG/ML IJ SOLN
25.0000 mg | Freq: Once | INTRAMUSCULAR | Status: AC
  Administered 2024-10-07: 25 mg via INTRAVENOUS
  Filled 2024-10-07: qty 1

## 2024-10-07 MED ORDER — PREDNISONE 10 MG PO TABS
10.0000 mg | ORAL_TABLET | Freq: Two times a day (BID) | ORAL | Status: DC
  Administered 2024-10-07 – 2024-10-08 (×2): 10 mg via ORAL
  Filled 2024-10-07 (×2): qty 1

## 2024-10-07 MED ORDER — PREGABALIN 75 MG PO CAPS
75.0000 mg | ORAL_CAPSULE | Freq: Two times a day (BID) | ORAL | Status: DC
  Administered 2024-10-07 – 2024-10-08 (×3): 75 mg via ORAL
  Filled 2024-10-07 (×3): qty 1

## 2024-10-07 MED ORDER — OXYCODONE HCL 5 MG PO TABS
5.0000 mg | ORAL_TABLET | ORAL | Status: DC | PRN
  Administered 2024-10-07 – 2024-10-08 (×3): 5 mg via ORAL
  Filled 2024-10-07 (×3): qty 1

## 2024-10-07 NOTE — Progress Notes (Signed)
 Physical Therapy Treatment Patient Details Name: Abigail Harding MRN: [id number] DOB: 1991/06/07 Today's Date: 10/07/2024   History of Present Illness Abigail Harding is a 33 y.o. female came to to ED with low back pain and weakness in L LE s/p picking up large bag of dog food. 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 Pt reports she will probably go home with her mother since her boyfriend works and her daughter is staying with her mother. Acute PT to cont to follow. Pt to continue to benefit from outpt PT to address lower back radiating pain, muscle weakness, and progress back to indep function.    If plan is discharge home, recommend the following: A little help with walking and/or transfers;A little help with bathing/dressing/bathroom;Assistance with cooking/housework;Help with stairs or ramp for entrance   Can travel by private vehicle        Equipment Recommendations  Rolling walker (2 wheels);BSC/3in1    Recommendations for Other Services OT consult     Precautions / Restrictions Precautions Precautions: Fall Recall of Precautions/Restrictions: Intact Required Braces or Orthoses: Spinal Brace Spinal Brace: Thoracolumbosacral orthotic Restrictions Weight Bearing Restrictions Per Provider Order: No     Mobility  Bed Mobility               General bed mobility comments: pt sitting up in chair upon PT arrival    Transfers Overall transfer level: Needs assistance Equipment used: Rolling walker (2 wheels) Transfers: Sit to/from Stand Sit to Stand: Contact guard assist           General transfer comment: verbal cues  to push up from arm rests and to reach back for armrests, increased time    Ambulation/Gait Ambulation/Gait assistance: Contact guard assist Gait Distance (Feet): 200 Feet Assistive device: Rolling walker (2 wheels) Gait Pattern/deviations: Step-to pattern, Decreased stride length, Trunk flexed Gait velocity: decr     General Gait Details: pt with increased UE dependency, verbal cues to stay in walker and contract abdominal muscles to help support back and minimize trunk flexion. pt c/o my L hip keeps trying to lock up. Pt with decreased L LE WBing tolerance compared to R   Stairs Stairs: Yes Stairs assistance: Contact guard assist Stair Management: One rail Right, Step to pattern, Sideways Number of Stairs: 4 General stair comments: pt with good return demonstration   Wheelchair Mobility     Tilt Bed    Modified Rankin (Stroke Patients Only)       Balance Overall balance assessment: Needs assistance   Sitting balance-Leahy Scale: Good     Standing balance support: Bilateral upper extremity supported, During functional activity Standing balance-Leahy Scale: Fair Standing balance comment: statically                            Communication Communication Communication: No apparent difficulties  Cognition Arousal: Alert Behavior During Therapy: Anxious   PT - Cognitive impairments: No apparent impairments                       PT - Cognition Comments: pt anxious and is aware  requesting her home anti-anxiety medication however was still cooperative and motivated to progress mobiltiy Following commands: Intact      Cueing Cueing Techniques: Verbal cues  Exercises      General Comments General comments (skin integrity, edema, etc.): VSS on RA      Pertinent Vitals/Pain Pain Assessment Pain Assessment: Faces Faces Pain Scale: Hurts even more Pain Location: L hip Pain Descriptors / Indicators: Aching, Tightness, Spasm, Burning     Home Living                          Prior Function            PT Goals (current goals can now be found in the care plan section) Acute Rehab PT Goals Patient Stated Goal: home PT Goal Formulation: With patient Time For Goal Achievement: 10/19/24 Potential to Achieve Goals: Good    Frequency    Min 2X/week      PT Plan      Co-evaluation              AM-PAC PT 6 Clicks Mobility   Outcome Measure  Help needed turning from your back to your side while in a flat bed without using bedrails?: A Little Help needed moving from lying on your back to sitting on the side of a flat bed without using bedrails?: A Little Help needed moving to and from a bed to a chair (including a wheelchair)?: A Little Help needed standing up from a chair using your arms (e.g., wheelchair or bedside chair)?: A Little Help needed to walk in hospital room?: A Little Help needed climbing 3-5 steps with a railing? : A Little 6 Click Score: 18    End of Session Equipment Utilized During Treatment: Back brace Activity Tolerance: Patient tolerated treatment well Patient left: with call bell/phone within reach;in chair Nurse Communication: Mobility status PT Visit Diagnosis: Unsteadiness on feet (R26.81);Other abnormalities of gait and mobility (R26.89);Muscle weakness (generalized) (M62.81);Difficulty in walking, not elsewhere classified (R26.2)     Time: 0910-0930 PT Time Calculation (min) (ACUTE ONLY): 20 min  Charges:    $Gait Training: 8-22 mins PT General Charges $$ ACUTE PT VISIT: 1 Visit                     Norene Ames, PT, DPT Acute Rehabilitation Services Secure chat preferred Office #: [phone]    Norene CHRISTELLA Ames 10/07/2024, 10:00 AM

## 2024-10-07 NOTE — Progress Notes (Signed)
 Initially received call from nursing staff regarding significant anxiety with patient reporting to nurse that at time of admission her anxiety medications were not ordered as she takes at home.  Upon review of med rec patient is currently on 3 times daily BuSpar , Seroquel , and Prozac .  Upon review of chart patient is ready for discharge so she has been transition from IV to oral pain medications.  She was also started on Lyrica  this admission as an adjunct to her oral narcotics.  Due to her increasing anxiety and agitation I did order a one-time dose of oral Ativan .  Later she complained of itching and requested Benadryl  as opposed to the currently ordered Vistaril .  After this was accomplished she later requested a dose of IV Dilaudid  which I declined.  The nurse did explain to the patient my rationale for doing this and the patient verbalized understanding.

## 2024-10-07 NOTE — Assessment & Plan Note (Signed)
 Secondary to demargination in setting of steroid use No clinical signs or concerns of infectious etiology at this time No indication for antibiotics

## 2024-10-07 NOTE — Plan of Care (Signed)

## 2024-10-07 NOTE — Progress Notes (Addendum)
 PROGRESS NOTE  Abigail Harding  FMW:[id number] DOB: 01-30-1991 DOA: 10/04/2024 PCP: [facility]   Abigail Harding is a 33 year old female with history of chronic low back pain with sciatica, who presented to the emergency department on 11/22 for chief concerns of back pain.  Patient was picking up grocery, large bag of dog food which caused to aggravate her chronic low back pain.  11/22: Admitted to [facility] for pain control.  11/23: Assumed care of patient. Dilaudid  0.5-1 mg every 2 hours as needed was discontinued and change to Dilaudid  1 mg q4h prn.   11/25: transitioned to prednisone  40 BID with meals for 3 days.  Reviewed overnight nursing notes.  Patient did not disclose her medications during pharmacy tech med reconciliation.  Then she took her p.o. medications and self administered them overnight and then let the nurse know what she did.  Nurse then informed pharmacy and patient's medication have been taken to pharmacy for safekeeping.  Her home medications have been resumed, buspirone  7.5 mg 3 times daily, fluoxetine  40 mg daily, quetiapine  150 mg nightly, birth control medication tablet daily, Robaxin  500 mg 3 times daily were resumed.  Oxycodone  5 mg every 4 hours as needed for moderate pain, severe pain for 36 more hours ordered.  Following this, patient can have oxycodone  5 mg every 6 hours as needed for moderate, severe pain, 2 more days.  Assessment & Plan:   Principal Problem:   Lumbago Active Problems:   Chronic low back pain   Sciatica   Leukocytosis   Generalized anxiety disorder   Weakness   Insomnia   Assessment and Plan:  * Lumbago Dilaudid  0.5-1 mg every 2 hours as needed was initiated by admitting provider 11/23: Dilaudid  changed to every 4 hours as needed Oxycodone  5 mg every 4 hours as needed for moderate pain Continue gabapentin  300 mg 3 times daily  Neurosurgery was consulted on admission and recommends Decadron   4 mg every 6 hours, 1 day ordered  11/4: Decadron  IV transition to p.o. every 6 hours, for 1 day.  We will continue to taper to prednisone  tomorrow.  IV pain medication has been discontinued.  Oxycodone  5 mg p.o. every 4 hours as needed for moderate pain and severe pain ordered.  Chronic low back pain PDMP reviewed Current no active prescriptions.  Most recent Rx are pregabalin  75 mg capsule, 60 capsules, 30-day supply written and filled on 07/25/2024.  Oxycodone  5 mg tablet, 10 tablets, 3-day supply written and filled on 04/07/2024.  Leukocytosis Secondary to demargination in setting of steroid use No clinical signs or concerns of infectious etiology at this time No indication for antibiotics  Generalized anxiety disorder Buspirone  7.5 mg 3 times daily, fluoxetine  40 mg daily were resumed on admission Lorazepam  0.25 mg po every 6 hours as needed for anxiety  Insomnia Quetiapine  150 milligrams nightly  Weakness Patient tells me that she has not been able to move or feel anything in her left leg since 8 AM on 11/22.  She reports that she felt a pop in her back after lifting the dog food bag evening of 11/21. Neurology has been consulted and they are aware  DVT prophylaxis: Enoxaparin  Code Status: Full code Family Communication:  Disposition Plan: Pending SNF.  Patient is medically ready for discharge Level of care: Med-Surg  Consultants:  PT, OT, TOC, neurosurgery, neurology  Procedures:  None indicated  Antimicrobials: None indicated  Subjective:  At bedside, patient awake alert and oriented  x 3.  She reports she is feeling much better and has been able to ambulate to the restroom and back from the restroom on her own.  She reports that she did not know that she was supposed to tell the pharmacy tech her medications.  She did not even remember that she had her medications in her bag.  She just did not know that she was supposed to let the hospital know so that her  medications can be administered from hospital supply.  Objective: Vitals:   10/06/24 1740 10/06/24 2050 10/07/24 0525 10/07/24 0908  BP: 109/62 122/62 105/66 126/84  Pulse: 69 72 70 67  Resp: 17   16  Temp: 98.5 F (36.9 C) 98.2 F (36.8 C) 98.1 F (36.7 C) 98.4 F (36.9 C)  TempSrc: Oral Oral Oral Oral  SpO2: 99% 99% 100% 100%  Weight:      Height:        Intake/Output Summary (Last 24 hours) at 10/07/2024 1241 Last data filed at 10/07/2024 1030 Gross per 24 hour  Intake 820 ml  Output --  Net 820 ml   Filed Weights   10/04/24 1106  Weight: 59 kg   Examination:  General exam: Appears calm and comfortable  Respiratory system: Clear to auscultation. Respiratory effort normal. Cardiovascular system: S1 & S2 heard, RRR. No JVD, murmurs, rubs, gallops or clicks. No pedal edema. Gastrointestinal system: Abdomen is nondistended, soft and nontender. No organomegaly or masses felt. Normal bowel sounds heard. Central nervous system: Alert and oriented. No focal neurological deficits. Extremities: Symmetric 5 x 5 power. Skin: No rashes, lesions or ulcers Psychiatry: Judgement and insight appear normal. Mood & affect appropriate.   Data Reviewed: I have personally reviewed following labs and imaging studies  CBC: Recent Labs  Lab 10/05/24 0021 10/07/24 0913  WBC 10.3 14.2*  HGB 12.8 13.5  HCT 37.9 39.8  MCV 90.7 89.4  PLT 207 237   Basic Metabolic Panel: Recent Labs  Lab 10/05/24 0021 10/07/24 0913  NA  --  138  K  --  4.6  CL  --  102  CO2  --  23  GLUCOSE  --  133*  BUN  --  10  CREATININE 0.87 0.75  CALCIUM  --  9.2   GFR: Estimated Creatinine Clearance: 88 mL/min (by C-G formula based on SCr of 0.75 mg/dL).  Radiology Studies: CT HIP LEFT W CONTRAST Result Date: 10/06/2024 CLINICAL DATA:  Hip pain, chronic, no prior imaging Chronic low back pain with left lower extremity radiculopathy/weakness. Previous back surgery. EXAM: CT OF THE LOWER LEFT  EXTREMITY WITH CONTRAST TECHNIQUE: Multidetector CT imaging of the pelvis and left hip was performed according to the standard protocol following intravenous contrast administration. RADIATION DOSE REDUCTION: This exam was performed according to the departmental dose-optimization program which includes automated exposure control, adjustment of the mA and/or kV according to patient size and/or use of iterative reconstruction technique. CONTRAST:  75mL OMNIPAQUE  IOHEXOL  350 MG/ML SOLN COMPARISON:  Abdominopelvic CT 11/15/2023. FINDINGS: Bones/Joint/Cartilage No evidence of acute fracture, dislocation or femoral head osteonecrosis. Hip joint spaces are preserved, and there are no significant joint effusions. The sacroiliac joints appear normal. Postsurgical changes in the lower lumbar spine. Ligaments Suboptimally assessed by CT. Muscles and Tendons The pelvic muscles are symmetric, without evidence of atrophy or abnormal enhancement. Soft tissues No periarticular inflammatory changes or fluid collections are identified. Mildly increased density within the bladder lumen, likely related to recent gadolinium administration for MRI. No  suspicious adnexal findings. IMPRESSION: 1. No acute findings or explanation for the patient's symptoms. 2. No significant hip arthropathy or periarticular inflammatory changes. 3. Postsurgical changes in the lower lumbar spine. Electronically Signed   By: Elsie Perone M.D.   On: 10/06/2024 08:04   Scheduled Meds:  busPIRone   7.5 mg Oral TID   docusate sodium   100 mg Oral BID   Drospirenone   4 mg Oral Daily   enoxaparin  (LOVENOX ) injection  40 mg Subcutaneous Q24H   FLUoxetine   40 mg Oral Daily   lidocaine   1 patch Transdermal Q24H   methocarbamol   500 mg Oral TID   predniSONE   10 mg Oral BID WC   pregabalin   75 mg Oral BID   QUEtiapine   150 mg Oral QHS   senna  1 tablet Oral BID    LOS: 2 days   Time spent: 35 minutes.  Dr. Sherre [facility] If 7PM-7AM, please  contact night-coverage 10/07/2024, 12:41 PM

## 2024-10-07 NOTE — Assessment & Plan Note (Signed)
 Quetiapine  150 milligrams nightly

## 2024-10-07 NOTE — Progress Notes (Signed)
 The Pt made me aware that she had her home meds and that she had taken them the night before. I educated her on why its important to not keep home meds on you but in return allow us  to administer the required meds. Meds were taken and counted with Freddrick, RN and stored in pharmacy until DC. Provider didn't feel comfortable giving the psych meds that sh was requesting at bedtime. He stated he would pass this issue along and pharmacy would handle this situation and verify meds.

## 2024-10-07 NOTE — Assessment & Plan Note (Signed)
 Buspirone  7.5 mg 3 times daily, fluoxetine  40 mg daily were resumed on admission Lorazepam  0.25 mg po every 6 hours as needed for anxiety

## 2024-10-07 NOTE — Progress Notes (Incomplete)
 Patient c/o no change in anxiety after receiving 0.25 mg of Ativan  on day shift. She states her medications have not been given to her the way she takes them at home since being here. Pt also c/o itching but did not want IM Vistaril  and requested IV Benadryl , and requested IV pain medication if possible for 7/10 pain of bilateral hips. Pt was educated by this RN about why her IV pain medication was taken away considering possible discharge tomorrow. On call provider notified, new orders put in and followed and patient educated by this RN. Pt verbalized understanding.

## 2024-10-07 NOTE — Plan of Care (Signed)
  Problem: Education: Goal: Knowledge of General Education information will improve Description: Including pain rating scale, medication(s)/side effects and non-pharmacologic comfort measures Outcome: Progressing   Problem: Health Behavior/Discharge Planning: Goal: Ability to manage health-related needs will improve Outcome: Progressing   Problem: Clinical Measurements: Goal: Ability to maintain clinical measurements within normal limits will improve Outcome: Progressing Goal: Will remain free from infection Outcome: Progressing Goal: Diagnostic test results will improve Outcome: Progressing Goal: Respiratory complications will improve Outcome: Progressing Goal: Cardiovascular complication will be avoided Outcome: Progressing   Problem: Activity: Goal: Risk for activity intolerance will decrease Outcome: Progressing   Problem: Nutrition: Goal: Adequate nutrition will be maintained Outcome: Progressing   Problem: Coping: Goal: Level of anxiety will decrease Outcome: Progressing   Problem: Elimination: Goal: Will not experience complications related to bowel motility Outcome: Progressing Goal: Will not experience complications related to urinary retention Outcome: Progressing   Problem: Pain Managment: Goal: General experience of comfort will improve and/or be controlled Outcome: Progressing   Problem: Safety: Goal: Ability to remain free from injury will improve Outcome: Progressing   Problem: Skin Integrity: Goal: Risk for impaired skin integrity will decrease Outcome: Progressing Patient has been very anxious abt her home meds. MD made aware. Talked to pharmacist abt her birth control pills. Meds has been given. PRN pain and nausea meds given.

## 2024-10-08 ENCOUNTER — Other Ambulatory Visit (HOSPITAL_COMMUNITY): Payer: Self-pay

## 2024-10-08 DIAGNOSIS — M5441 Lumbago with sciatica, right side: Secondary | ICD-10-CM | POA: Diagnosis not present

## 2024-10-08 DIAGNOSIS — G8929 Other chronic pain: Secondary | ICD-10-CM

## 2024-10-08 DIAGNOSIS — M5442 Lumbago with sciatica, left side: Secondary | ICD-10-CM | POA: Diagnosis not present

## 2024-10-08 MED ORDER — LIDOCAINE 5 % EX PTCH: 1.0000 | MEDICATED_PATCH | CUTANEOUS | 0 refills | Status: AC

## 2024-10-08 MED ORDER — PREDNISONE 10 MG PO TABS: 10.0000 mg | ORAL_TABLET | Freq: Two times a day (BID) | ORAL | 0 refills | Status: AC

## 2024-10-08 MED ORDER — FLUOXETINE HCL 40 MG PO CAPS: 40.0000 mg | ORAL_CAPSULE | Freq: Every day | ORAL | 0 refills | Status: AC

## 2024-10-08 MED ORDER — PREGABALIN 75 MG PO CAPS: 75.0000 mg | ORAL_CAPSULE | Freq: Two times a day (BID) | ORAL | 0 refills | Status: AC

## 2024-10-08 MED ORDER — OXYCODONE HCL 5 MG PO TABS: 2.5000 mg | ORAL_TABLET | Freq: Two times a day (BID) | ORAL | Status: DC | PRN

## 2024-10-08 MED ORDER — OXYCODONE HCL 5 MG PO TABS: 5.0000 mg | ORAL_TABLET | Freq: Four times a day (QID) | ORAL | Status: DC | PRN

## 2024-10-08 NOTE — Discharge Summary (Signed)
 Physician Discharge Summary  Abigail Harding FMW:[id number] DOB: [birth year] DOA: 10/04/2024  PCP: [facility]  Admit date: 10/04/2024 Discharge date: 10/08/2024  Admitted From: Home Disposition: Home  Recommendations for Outpatient Follow-up:  Follow up with PCP in 1-2 weeks Follow-up with previous spine surgeon  Home Health: None Equipment/Devices: Walker  Discharge Condition: Stable CODE STATUS: Full Diet recommendation: Low-salt low-fat low-carb diet  Brief/Interim Summary: Abigail Harding is a 33 year old female with history of chronic low back pain with sciatica, who presented to the emergency department on 11/22 for chief concerns of back pain. Patient was picking up grocery, large bag of dog food which caused to aggravate her chronic low back pain.   11/22: Admitted to [facility] for pain control. 11/23: Dilaudid  initially increased due to poor pain control  11/25: Case was discussed with neurosurgery and neurology, no indication or findings on imaging consistent with her complaints, recommended steroids x3 days and weaning of narcotics as appropriate 11/26: Patient weaned off narcotics, unfortunately she is reporting allergic to most nonnarcotic analgesics as such her discharge regimen is somewhat limited.  Recommend close follow-up with neurosurgery/spine physician she had previously seen at outside facility as well as pain management if she continues to have chronic debilitating pain.  At this time given improvement in symptoms, recommendations by PT to continue ambulation with assistance and a walker in the interim while strength is regained -she is otherwise stable and agreeable for discharge home given improvement with need for close follow-up as discussed at length above and at bedside with the patient.  Of note patient reports being currently on multiple medications that were not listed in our system, pharmacy or via PMP aware.  She  reports being on Percocet as well as unspecified benzodiazepine which again do not appear to be available in our system.  Recommended close follow-up with PCP/prescriber of these medications to get refills per her discussion with that specialty.  Medication changes as outlined below.  Discharge Diagnoses:  Principal Problem:   Lumbago Active Problems:   Chronic low back pain   Sciatica   Leukocytosis   Generalized anxiety disorder   Weakness   Insomnia  Lumbago, acute on chronic, improving Continue gabapentin , Lyrica , prednisone  -Discontinue narcotics as discussed above -Complaints not consistent with imaging per neurology and neurosurgery - No active narcotic prescriptions noted.  Most recent Rx are pregabalin  75 mg capsule, 60 capsules, 30-day supply written and filled on 07/25/2024.   Leukocytosis Reactive leukocytosis versus demargination in setting of steroid use   Generalized anxiety disorder Continue home buspirone  7.5 mg 3 times daily, fluoxetine  40 mg    Insomnia Recommend against any future Seroquel  given current home medications and concern for QT prolongation   Weakness Reported paresthesias appear to be improving, neurology and neurosurgery evaluated and symptoms are not consistent with reported injury as above or imaging.  PT recommending walker at discharge as below.  Discharge Instructions  Discharge Instructions     Ambulatory referral to Physical Therapy   Complete by: As directed    DX Lumbago M54.4   PT eval and treat   Call MD for:  severe uncontrolled pain   Complete by: As directed    Diet - low sodium heart healthy   Complete by: As directed    Increase activity slowly   Complete by: As directed       Allergies as of 10/08/2024       Reactions   Hydrocodone  Itching   Ketorolac   Itching, Nausea And Vomiting   Morphine And Codeine Itching   Tramadol Itching, Nausea And Vomiting, Other (See Comments)   cramping   Venlafaxine  Other (See  Comments)   During taper of medication. Withdrawal symptoms Has no issues with taking this medication   Betamethasone Swelling   Flonase  [fluticasone ] Other (See Comments)   Pt states flonase  made her nose burn and her tongue turn purple.   Zofran  [ondansetron ] Itching   nightmares   Hydroxyzine  Anxiety   The 25 mg tablets were fine, but when she took the 50 mg tablets at home she had adverse effects. Sweating        Medication List     STOP taking these medications    albuterol 108 (90 Base) MCG/ACT inhaler Commonly known as: VENTOLIN HFA       TAKE these medications    busPIRone  7.5 MG tablet Commonly known as: BUSPAR  Take 7.5 mg by mouth 3 (three) times daily.   FLUoxetine  40 MG capsule Commonly known as: PROZAC  Take 1 capsule (40 mg total) by mouth daily. Start taking on: October 09, 2024   lidocaine  5 % Commonly known as: LIDODERM  Place 1 patch onto the skin daily. Remove & Discard patch within 12 hours or as directed by MD Start taking on: October 09, 2024   meloxicam 7.5 MG tablet Commonly known as: MOBIC Take 7.5 mg by mouth daily as needed for pain.   predniSONE  10 MG tablet Commonly known as: DELTASONE  Take 1 tablet (10 mg total) by mouth 2 (two) times daily with a meal for 1 day.   pregabalin  75 MG capsule Commonly known as: LYRICA  Take 1 capsule (75 mg total) by mouth 2 (two) times daily.   promethazine  25 MG tablet Commonly known as: PHENERGAN  Take 1 tablet (25 mg total) by mouth every 6 (six) hours as needed for nausea or vomiting. What changed: how much to take   rizatriptan 10 MG disintegrating tablet Commonly known as: MAXALT-MLT Take 10 mg by mouth as needed for migraine.   Slynd  4 MG Tabs Generic drug: Drospirenone  Take 1 tablet by mouth daily. Pt skips placebo pills and continues to next pack in order to skip period   traZODone  100 MG tablet Commonly known as: DESYREL  Take 100 mg by mouth at bedtime as needed.                Durable Medical Equipment  (From admission, onward)           Start     Ordered   10/08/24 1312  For home use only DME Bedside commode  Once       Question:  Patient needs a bedside commode to treat with the following condition  Answer:  Ambulatory dysfunction   10/08/24 1312   10/06/24 0954  For home use only DME 3 n 1  Once        10/06/24 0954   10/06/24 0953  For home use only DME Walker rolling  Once       Question Answer Comment  Walker: With 5 Inch Wheels   Patient needs a walker to treat with the following condition Weakness      10/06/24 0954            Allergies  Allergen Reactions   Hydrocodone  Itching   Ketorolac  Itching and Nausea And Vomiting   Morphine And Codeine Itching   Tramadol Itching, Nausea And Vomiting and Other (See Comments)    cramping   Venlafaxine   Other (See Comments)    During taper of medication. Withdrawal symptoms Has no issues with taking this medication     Betamethasone Swelling   Flonase  [Fluticasone ] Other (See Comments)    Pt states flonase  made her nose burn and her tongue turn purple.   Zofran  [Ondansetron ] Itching    nightmares   Hydroxyzine  Anxiety    The 25 mg tablets were fine, but when she took the 50 mg tablets at home she had adverse effects. Sweating    Consultations: Neurology, neurosurgery  Procedures/Studies: CT HIP LEFT W CONTRAST Result Date: 10/06/2024 CLINICAL DATA:  Hip pain, chronic, no prior imaging Chronic low back pain with left lower extremity radiculopathy/weakness. Previous back surgery. EXAM: CT OF THE LOWER LEFT EXTREMITY WITH CONTRAST TECHNIQUE: Multidetector CT imaging of the pelvis and left hip was performed according to the standard protocol following intravenous contrast administration. RADIATION DOSE REDUCTION: This exam was performed according to the departmental dose-optimization program which includes automated exposure control, adjustment of the mA and/or kV according to patient  size and/or use of iterative reconstruction technique. CONTRAST:  75mL OMNIPAQUE  IOHEXOL  350 MG/ML SOLN COMPARISON:  Abdominopelvic CT 11/15/2023. FINDINGS: Bones/Joint/Cartilage No evidence of acute fracture, dislocation or femoral head osteonecrosis. Hip joint spaces are preserved, and there are no significant joint effusions. The sacroiliac joints appear normal. Postsurgical changes in the lower lumbar spine. Ligaments Suboptimally assessed by CT. Muscles and Tendons The pelvic muscles are symmetric, without evidence of atrophy or abnormal enhancement. Soft tissues No periarticular inflammatory changes or fluid collections are identified. Mildly increased density within the bladder lumen, likely related to recent gadolinium administration for MRI. No suspicious adnexal findings. IMPRESSION: 1. No acute findings or explanation for the patient's symptoms. 2. No significant hip arthropathy or periarticular inflammatory changes. 3. Postsurgical changes in the lower lumbar spine. Electronically Signed   By: Elsie Perone M.D.   On: 10/06/2024 08:04   MR THORACIC SPINE WO CONTRAST Result Date: 10/04/2024 EXAM: MR Thoracic Spine without 10/04/2024 08:06:26 PM TECHNIQUE: Multiplanar multisequence MRI of the thoracic spine was performed without the administration of intravenous contrast. COMPARISON: None available CLINICAL HISTORY: Mid to low back pain, acute left leg weakness. FINDINGS: BONES AND ALIGNMENT: Normal alignment. Normal vertebral body heights. Marrow signal is unremarkable. SPINAL CORD: Normal spinal cord volume. Normal spinal cord signal. SOFT TISSUES: Unremarkable. DEGENERATIVE CHANGES: No significant disc herniation. No spinal canal stenosis or neural foraminal narrowing. IMPRESSION: 1. No spinal canal stenosis or neural foraminal narrowing in the thoracic spine. Normal examination. Electronically signed by: Franky Stanford MD 10/04/2024 08:32 PM EST RP Workstation: [id number]   MR Lumbar Spine W Wo  Contrast Result Date: 10/04/2024 CLINICAL DATA:  Low back pain.  Acute leg weakness. EXAM: MRI LUMBAR SPINE WITHOUT AND WITH CONTRAST TECHNIQUE: Multiplanar and multiecho pulse sequences of the lumbar spine were obtained without and with intravenous contrast. CONTRAST:  6mL GADAVIST  GADOBUTROL  1 MMOL/ML IV SOLN COMPARISON:  CT 01/11/2024.  MRI 04/11/2023. FINDINGS: Segmentation:  5 lumbar type vertebral bodies. Alignment:  No malalignment. Vertebrae: No fracture or focal bone lesion. Mild endplate edematous change at L4-5 could relate to low back pain. Conus medullaris and cauda equina: Conus extends to the L1 level. Conus and cauda equina appear normal. Paraspinal and other soft tissues: Negative Disc levels: No abnormality from T11-12 through L3-4. L4-5: Previous right hemilaminectomy. Shallow disc protrusion with a focal herniation centrally and just to the right of midline with slight caudal down turning. This is slightly larger than was  seen in May of 2024. This results in lateral recess narrowing on the right which could possibly affect the L5 nerve. L5-S1: Normal IMPRESSION: 1. L4-5: Previous right hemilaminectomy. Shallow disc protrusion with a focal herniation centrally and just to the right of midline with slight caudal down turning. This is slightly larger than was seen in May of 2024. This results in lateral recess narrowing on the right which could possibly affect the right L5 nerve. 2. Mild endplate edematous change at L4-5 could relate to low back pain. Electronically Signed   By: Oneil Officer M.D.   On: 10/04/2024 14:56     Subjective: No acute issues or events overnight   Discharge Exam: Vitals:   10/08/24 0524 10/08/24 0908  BP: 99/63 116/68  Pulse: 75 92  Resp:  16  Temp: 98.1 F (36.7 C) 97.9 F (36.6 C)  SpO2: 100% 100%   Vitals:   10/07/24 1804 10/07/24 2212 10/08/24 0524 10/08/24 0908  BP: (!) 107/58 95/64 99/63  116/68  Pulse: 75 76 75 92  Resp: 16   16  Temp: 98.1 F  (36.7 C) 98 F (36.7 C) 98.1 F (36.7 C) 97.9 F (36.6 C)  TempSrc: Oral Oral Oral Oral  SpO2: 100% 99% 100% 100%  Weight:      Height:        General: Pt is alert, awake, not in acute distress Cardiovascular: RRR, S1/S2 +, no rubs, no gallops Respiratory: CTA bilaterally, no wheezing, no rhonchi Abdominal: Soft, NT, ND, bowel sounds + Extremities: no edema, no cyanosis    The results of significant diagnostics from this hospitalization (including imaging, microbiology, ancillary and laboratory) are listed below for reference.     Microbiology: No results found for this or any previous visit (from the past 240 hours).   Labs: BNP (last 3 results) No results for input(s): BNP in the last 8760 hours. Basic Metabolic Panel: Recent Labs  Lab 10/05/24 0021 10/07/24 0913  NA  --  138  K  --  4.6  CL  --  102  CO2  --  23  GLUCOSE  --  133*  BUN  --  10  CREATININE 0.87 0.75  CALCIUM  --  9.2   Liver Function Tests: No results for input(s): AST, ALT, ALKPHOS, BILITOT, PROT, ALBUMIN in the last 168 hours. No results for input(s): LIPASE, AMYLASE in the last 168 hours. No results for input(s): AMMONIA in the last 168 hours. CBC: Recent Labs  Lab 10/05/24 0021 10/07/24 0913  WBC 10.3 14.2*  HGB 12.8 13.5  HCT 37.9 39.8  MCV 90.7 89.4  PLT 207 237   Cardiac Enzymes: No results for input(s): CKTOTAL, CKMB, CKMBINDEX, TROPONINI in the last 168 hours. BNP: Invalid input(s): POCBNP CBG: No results for input(s): GLUCAP in the last 168 hours. D-Dimer No results for input(s): DDIMER in the last 72 hours. Hgb A1c No results for input(s): HGBA1C in the last 72 hours. Lipid Profile No results for input(s): CHOL, HDL, LDLCALC, TRIG, CHOLHDL, LDLDIRECT in the last 72 hours. Thyroid function studies No results for input(s): TSH, T4TOTAL, T3FREE, THYROIDAB in the last 72 hours.  Invalid input(s): FREET3 Anemia  work up No results for input(s): VITAMINB12, FOLATE, FERRITIN, TIBC, IRON, RETICCTPCT in the last 72 hours. Urinalysis    Component Value Date/Time   COLORURINE YELLOW 08/14/2023 0026   APPEARANCEUR HAZY (A) 08/14/2023 0026   LABSPEC 1.020 08/14/2023 0026   PHURINE 6.0 08/14/2023 0026   GLUCOSEU NEGATIVE 08/14/2023 0026  HGBUR NEGATIVE 08/14/2023 0026   BILIRUBINUR NEGATIVE 08/14/2023 0026   BILIRUBINUR neg 10/12/2017 0925   KETONESUR 5 (A) 08/14/2023 0026   PROTEINUR NEGATIVE 08/14/2023 0026   UROBILINOGEN 0.2 10/12/2017 0925   UROBILINOGEN 0.2 12/22/2014 1808   NITRITE NEGATIVE 08/14/2023 0026   LEUKOCYTESUR LARGE (A) 08/14/2023 0026   Sepsis Labs Recent Labs  Lab 10/05/24 0021 10/07/24 0913  WBC 10.3 14.2*   Microbiology No results found for this or any previous visit (from the past 240 hours).   Time coordinating discharge: Over 30 minutes  SIGNED:   Elsie JAYSON [place], DO [facility] 10/08/2024, 5:50 PM Pager   If 7PM-7AM, please contact night-coverage [email or web address]

## 2024-10-08 NOTE — Progress Notes (Signed)
 Occupational Therapy Treatment Patient Details Name: Abigail Harding MRN: [id number] DOB: 02/11/1991 Today's Date: 10/08/2024   History of present illness Abigail Harding is a 33 y.o. female came to to ED with low back pain and weakness in L LE s/p picking up large bag of dog food. 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 Patient with good recall and adherence to back precautions this visit, and able to complete lower body dressing in long sitting. Patient asking to practice stairs, with good recall from PT session previous day. Patient educated on shower transfers and safety, with patient able to teach back to OT. OT will continue to follow acutely, with recommendation remaining appropriate.       If plan is discharge home, recommend the following:  A little help with walking and/or transfers;A lot of help with bathing/dressing/bathroom;Assistance with cooking/housework;Assist for transportation;Help with stairs or ramp for entrance   Equipment Recommendations  BSC/3in1    Recommendations for Other Services      Precautions / Restrictions Precautions Precautions: Fall Recall of Precautions/Restrictions: Intact Required Braces or Orthoses: Spinal Brace Spinal Brace: Thoracolumbosacral orthotic Restrictions Weight Bearing Restrictions Per Provider Order: No       Mobility Bed Mobility Overal bed mobility: Needs Assistance Bed Mobility: Sidelying to Sit, Rolling           General bed mobility comments: able to complete and adhere to back precautions    Transfers Overall transfer level: Needs assistance Equipment used: Rolling walker (2 wheels) Transfers: Sit to/from Stand Sit to Stand: Contact guard assist           General transfer comment: good  RW management     Balance Overall balance assessment: Needs assistance   Sitting balance-Leahy Scale: Good     Standing balance support: Bilateral upper extremity supported, During functional activity Standing balance-Leahy Scale: Fair Standing balance comment: statically                           ADL either performed or assessed with clinical judgement   ADL Overall ADL's : Needs assistance/impaired                 Upper Body Dressing : Set up;Sitting Upper Body Dressing Details (indicate cue type and reason): donning brace without issue Lower Body Dressing: Set up;Bed level Lower Body Dressing Details (indicate cue type and reason): able to complete in long sitting in bed Toilet Transfer: Contact guard assist;Ambulation;Rolling walker (2 wheels);Regular Toilet           Functional mobility during ADLs: Contact guard assist;Cueing for safety;Cueing for sequencing;Rolling walker (2 wheels) General ADL Comments: Patient seen in order to increase overall activity tolerance and independence. Patient with good recall and adherence to back precautions this visit, and able to complete lower body dressing in long sitting. Patient asking to practice stairs, with good recall from PT session previous day. Patient educated on shower transfers and safety, with patient able to teach back to OT. OT will continue to follow acutely, with recommendation remaining appropriate.    Extremity/Trunk Assessment              Diplomatic Services Operational Officer Communication Communication:  No apparent difficulties   Cognition Arousal: Alert Behavior During Therapy: Anxious, WFL for tasks assessed/performed Cognition: No apparent impairments             OT - Cognition Comments: Improved anxiety this session                 Following commands: Intact        Cueing   Cueing Techniques: Verbal cues  Exercises      Shoulder Instructions        General Comments VSS on RA    Pertinent Vitals/ Pain       Pain Assessment Pain Assessment: Faces Faces Pain Scale: Hurts little more Pain Location: L hip and foot Pain Descriptors / Indicators: Aching, Tightness, Spasm, Burning Pain Intervention(s): Limited activity within patient's tolerance, Monitored during session, Repositioned  Home Living                                          Prior Functioning/Environment              Frequency  Min 2X/week        Progress Toward Goals  OT Goals(current goals can now be found in the care plan section)  Progress towards OT goals: Progressing toward goals     Plan      Co-evaluation                 AM-PAC OT 6 Clicks Daily Activity     Outcome Measure   Help from another person eating meals?: None Help from another person taking care of personal grooming?: A Little Help from another person toileting, which includes using toliet, bedpan, or urinal?: A Little Help from another person bathing (including washing, rinsing, drying)?: A Little Help from another person to put on and taking off regular upper body clothing?: A Little Help from another person to put on and taking off regular lower body clothing?: A Little 6 Click Score: 19    End of Session Equipment Utilized During Treatment: Gait belt;Rolling walker (2 wheels);Back brace  OT Visit Diagnosis: Unsteadiness on feet (R26.81);Other abnormalities of gait and mobility (R26.89);Muscle weakness (generalized) (M62.81);Pain Pain - Right/Left: Left Pain - part of body: Leg   Activity Tolerance Patient tolerated treatment well   Patient Left in bed;with call bell/phone within reach   Nurse Communication Mobility status        Time: 9093-9072 OT Time Calculation (min): 21 min  Charges: OT General Charges $OT Visit: 1 Visit OT Treatments $Self Care/Home Management : 8-22 mins  Ronal Gift E. Kahli Fitzgerald, OTR/L Acute Rehabilitation  Services [phone]   Ronal Gift Salt 10/08/2024, 11:38 AM

## 2024-10-08 NOTE — Plan of Care (Signed)

## 2024-10-08 NOTE — Plan of Care (Signed)
 Patient's IV removed. With complaints of pain but tolerable. Stored meds from pharmacy has been returned to pt, friend at bedside. Kept safe. Problem: Education: Goal: Knowledge of General Education information will improve Description: Including pain rating scale, medication(s)/side effects and non-pharmacologic comfort measures Outcome: Progressing   Problem: Health Behavior/Discharge Planning: Goal: Ability to manage health-related needs will improve Outcome: Progressing   Problem: Clinical Measurements: Goal: Ability to maintain clinical measurements within normal limits will improve Outcome: Progressing Goal: Will remain free from infection Outcome: Progressing Goal: Diagnostic test results will improve Outcome: Progressing Goal: Respiratory complications will improve Outcome: Progressing Goal: Cardiovascular complication will be avoided Outcome: Progressing   Problem: Activity: Goal: Risk for activity intolerance will decrease Outcome: Progressing   Problem: Nutrition: Goal: Adequate nutrition will be maintained Outcome: Progressing   Problem: Coping: Goal: Level of anxiety will decrease Outcome: Progressing   Problem: Elimination: Goal: Will not experience complications related to bowel motility Outcome: Progressing Goal: Will not experience complications related to urinary retention Outcome: Progressing   Problem: Pain Managment: Goal: General experience of comfort will improve and/or be controlled Outcome: Progressing   Problem: Safety: Goal: Ability to remain free from injury will improve Outcome: Progressing   Problem: Skin Integrity: Goal: Risk for impaired skin integrity will decrease Outcome: Progressing

## 2024-10-30 NOTE — Progress Notes (Signed)
 RETURN VISIT:  Patient Name: Abigail Harding MR#: [id number] Date: 10/30/2024 Author: Norleen Capri, MD [facility] Chief Complaint: No chief complaint on file.   History of Present Illness:  (HPI, 10/30/2024):  Abigail Harding is 33 y.o. woman who returns for evaluation of her low back pain and buttock pain. She was recently hospitalized for pain. She reports that she had to move a lot of supplies at work, more than typical. She reports severe pain and weakness in her legs. She reports that her whole left leg went numb. She worked with PT and OT in the hospital. She has now been walking with a walker.  Problem List[1]  Medical History[2]  Surgical History[3]  Family History[4]  Social History[5]  Allergies[6]  Current Medications[7]  Review of Systems:   Objective: BP (!) 102/54 (BP Location: Left arm, Patient Position: Sitting)   Pulse 75   Temp 97.5 F (36.4 C) (Temporal)   Ht 1.626 m (5' 4)   Wt 69.9 kg (154 lb)   SpO2 94%   BMI 26.43 kg/m   Alert and oriented x3  Muscle strength testing: R L  Ankle dorsiflexion: 4/5 4/5  Ankle plantarflexion: 4/5 4/5  Knee extension 4/5 4/5  Knee flexion  4/5 4/5   Radiographic Studies:   MRI 10/04/24 shows L4-5 disc protrusion and decreased hydration s/p discectomy with lateral recess stenosis.  Assessment: Final diagnoses:  [M54.16] Lumbar radiculopathy  [Z98.890] Status post Right L4-5 revision microdiskectomy 02/27/22  [M54.50, G89.29] Chronic bilateral low back pain without sciatica    Assessment/Plan: 1. Lumbar radiculopathy  Ambulatory referral to Physical Therapy    2. Status post Right L4-5 revision microdiskectomy 02/27/22  Ambulatory referral to Physical Therapy    3. Chronic bilateral low back pain without sciatica  Ambulatory referral to Physical Therapy       We discussed with the patient findings on the exam and imaging.  Refer to PT for McKenzie  stretching and core strengthening. Prednisone  taper for inflammation. She reports allergy to Betamethasone but not Prednisone . We discussed a L4-5 ALIF if we cannot relieve symptoms with conservative measures.  Patient agreed with the plan and his/her questions were answered to patient's satisfaction.  Electronically signed by:  Norleen Capri, MD 10/30/2024 9:38 AM  Follow up: Return in about 4 weeks (around 11/27/2024).  Orders From This Encounter: Orders Placed This Encounter  Procedures   Ambulatory referral to Physical Therapy    Standing Status:   Future    Expiration Date:   11/30/2025    Referral Priority:   Routine    Referral Type:   ANCILLARY REFERRAL    Requested Specialty:   Physical Therapy    Number of Visits Requested:   1    I reviewed plan of care with Ms.Eckmann and she expresses understanding and is in agreement.  Instructed to see me sooner or call if any new concerns or progression of symptoms.  Thank you for this consultation and opportunity to take part in Ms.Hirschfeld 's care.  If you have any questions or concerns please do not hesitate to contact me at any time.           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   Recurrent herniation of lumbar disc   Weakness of right lower extremity   Neck pain   Sacroiliac joint pain   Cluneal neuropathy   Spondylosis of lumbar spine   Chronic  bilateral low back pain without sciatica  [2] Past Medical History: Diagnosis Date   Asthma (CMD)    exercise induced   Constipation    Endometriosis    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   Kidney stones    kidney stones   Lumbar disc herniation with radiculopathy 03/18/2021   Migraine    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, with status epilepticus    (CMD)    Ovarian cyst    PCOS (polycystic ovarian syndrome)   [3] Past Surgical History: Procedure Laterality Date   APPENDECTOMY     Procedure: APPENDECTOMY   DIAGNOSTIC LAPAROSCOPY N/A 11/17/2015   Procedure: DIAGNOSTIC LAPAROSCOPY-need suction irrigation;  Surgeon: Hermina Lenward Don, MD;  Location: [facility] MAIN OR;  Service: Gynecology;  Laterality: N/A;   DILATION AND CURETTAGE OF UTERUS     Procedure: DILATION AND CURETTAGE OF UTERUS   ENDOMETRIAL ABLATION     Procedure: ABLATION OF ENDOMETRIOSIS   EXTRACORPOREAL SHOCK WAVE LITHOTRIPSY Right 12/13/2018   Procedure: [facility] ESWL;  Surgeon: Manus Marcey Norfolk, MD;  Location: [facility] MAIN OR;  Service: Urology;  Laterality: Right;   EXTRACORPOREAL SHOCK WAVE LITHOTRIPSY Right 02/17/2020   Procedure: [facility] ESWL;  Surgeon: Marolyn Albright, MD;  Location: [facility] MAIN OR;  Service: Urology;  Laterality: Right;   LAPAROSCOPIC APPENDECTOMY N/A 11/15/2018   Procedure: APPENDECTOMY LAPAROSCOPIC;  Surgeon: Redell Lonni Kerns, MD;  Location: [facility] MAIN OR;  Service: General;  Laterality: N/A;   MICRODISCECTOMY LUMBAR Right 05/02/2021   Procedure: Right L4-5 LUMBAR MICRODISCECTOMY;  Surgeon: Norleen Maude Capri, MD;  Location: [facility] MAIN OR;  Service: Orthopedics;  Laterality: Right;  Jackson spine table, MetRx system, Orbye, C-arm   MICRODISCECTOMY LUMBAR Right 02/27/2022   Procedure: Right L4-5  Revision LUMBAR MICRODISCECTOMY;  Surgeon: Norleen Maude Capri, MD;  Location: [facility] MAIN OR;  Service: Orthopedics;  Laterality: Right;  [place] spine table, MetRx system, Orbye, C-arm    SACROILIAC JOINT INJECTION Right 08/22/2023   INJECTION/ASPIRATION JOINT SACROILLIAC - Right with PO Sedation performed by Toribio Badder, MD at [facility] PREM ASC OR   SACROILIAC JOINT INJECTION Bilateral 10/24/2023   BLOCK/INJECTION, NERVE:  Cluneal nerve block performed by Toribio Badder, MD at [facility] PRE[facility] ASC OR   TONSILLECTOMY     Procedure: TONSILLECTOMY   WISDOM TOOTH EXTRACTION     Procedure: WISDOM TOOTH EXTRACTION  [4] Family History Problem Relation Name Age of Onset   Urolithiasis Mother     Ovarian cysts Mother     Miscarriages / Stillbirths Mother     Diabetes Mother     Diabetes Maternal Grandmother     Heart attack Maternal Grandmother     Breast cancer Paternal Grandmother     Transient ischemic attack Father     Prostate cancer Maternal Uncle     Ovarian cancer Neg Hx     Colon cancer Neg Hx    [5] Social History Socioeconomic History   Marital status: Single  Tobacco Use   Smoking  status: Never   Smokeless tobacco: Current  Vaping Use   Vaping status: Every Day   Substances: Nicotine  Substance and Sexual Activity   Alcohol  use: Yes    Alcohol /week: 0.0 - 1.0 standard drinks of alcohol    Drug use: Not Currently    Types: Marijuana   Social Drivers of Health   Living Situation: Low Risk (10/05/2024)   Received from [facility]   Living Situation    In the last 12 months, was there a time when you were not able to pay the mortgage or rent on time?: No    In the past 12 months, how many times have you moved where you were living?: 0    At any time in the past 12 months, were you homeless or living in a shelter (including now)?: No  Food Insecurity: No Food Insecurity (10/05/2024)   Received from [facility]   Food vital sign    Within the past 12 months, you worried that your food would run out before you got the money to buy more.: Never true    Within the past 12 months, the food you bought just didn't last and you didn't have  money to get more.: Never true  Transportation Needs: No Transportation Needs (10/05/2024)   Received from [facility] - Transportation    In the past 12 months, has lack of transportation kept you from medical appointments or from getting medications?: No    In the past 12 months, has lack of transportation kept you from meetings, work, or from getting things needed for daily living?: No  Utilities: Not At Risk (10/05/2024)   Received from [facility]   Utilities    In the past 12 months has the electric, gas, oil, or water company threatened to shut off services in your home?: No  Safety: Not At Risk (10/05/2024)   Received from [facility]   Safety    Within the last year, have you been afraid of your partner or ex-partner?: No    Within the last year, have you been humiliated or emotionally abused in other ways by your partner or ex-partner?: No    Within the last year, have you been kicked, hit, slapped, or otherwise physically hurt by your partner or ex-partner?: No    Within the last year, have you been raped or forced to have any kind of sexual activity by your partner or ex-partner?: No  Alcohol  Screening: Not At Risk (07/13/2024)   AUDIT-C    Frequency of Alcohol  Consumption: Monthly or less    Average Number of Drinks: 1 or 2    Frequency of Binge Drinking: Never  Tobacco Use: High Risk (10/30/2024)   Patient History    Smoking Tobacco Use: Never    Smokeless Tobacco Use: Current  Depression: Not At Risk (10/30/2024)   PHQ-2    PHQ-2 Score: 0  Financial Resource Strain: Low Risk (11/29/2022)   Received from [facility]   Overall Financial Resource Strain (CARDIA)    Difficulty of Paying Living Expenses: Not very hard  [6] Allergies Allergen Reactions   Tramadol GI Intolerance and Other (See Comments)    cramping   Betamethasone Other (See Comments)    Steroid injection in wrist caused nausea and severe migraine   Hydroxyzine  Hcl Other (See  Comments)    Reaction: Sweating (intolerance)    Ketorolac  GI Intolerance    sts has taking toradol  with benadryl  and did not have a  reaction 5/19   Morphine Itching   Ondansetron  Hcl (Pf) Other (See Comments)    Night Terrors   Hydrocodone -Acetaminophen  Rash  [7] Current Outpatient Medications  Medication Sig Dispense Refill   albuterol HFA (PROVENTIL HFA;VENTOLIN HFA;PROAIR HFA) 90 mcg/actuation inhaler Inhale 2 puffs every 4 (four) hours as needed for wheezing or shortness of breath. 1 each 1   busPIRone  (BUSPAR ) 5 mg tablet TAKE 1 TABLET BY MOUTH 3 TIMES A DAY FOR ANXIETY     clonazePAM  (KlonoPIN ) 0.5 mg tablet Take 0.5 mg by mouth nightly as needed.     dicyclomine  (Bentyl ) 20 mg tab Take 20 mg by mouth.     drospirenone , contraceptive, (Slynd ) 4 mg (28) tab Take one pill continous and skip placebo 112 tablet 3   FLUoxetine  (PROzac ) 10 mg capsule Take 10 mg by mouth daily.     hydrOXYzine  (ATARAX ) 25 mg tablet take 1 tablet (25mg ) by mouth daily as needed     hyoscyamine  (LEVSIN /SL) 0.125 mg sublingual tablet Place 0.125 mg under the tongue.     ibuprofen  (MOTRIN ) 800 mg tablet Take 800 mg by mouth every 6 (six) hours as needed.     lidocaine  (LIDODERM ) 5 % patch Apply 1 patch topically daily. Remove & discard patch within 12 hours or as directed by MD. 30 patch 1   oxyCODONE -acetaminophen  (PERCOCET) 5-325 mg per tablet Take 1 tablet by mouth every 8 (eight) hours as needed for moderate pain (4-6). 15 tablet 0   PHENobarbital-hyoscyamine -atropine-scopolamine (DonnataL) 16.2-0.1037 -0.0194 mg per tablet Take 1 tablet by mouth.     predniSONE  (STERAPRED DS) 10 mg DsPk dose pack Use As Directed On Package. 21 tablet 0   pregabalin  (LYRICA ) 75 mg capsule Take 1 capsule (75 mg total) by mouth 2 (two) times a day. 180 capsule 3   promethazine  (PHENERGAN ) 12.5 mg tablet TAKE 1 TABLET BY MOUTH EVERY 6 HOURS AS NEEDED FOR NAUSEA OR VOMITING. 30 tablet 2    promethazine -dextromethorphan  (PHENERGAN  DM) 6.25-15 mg/5 mL syrp syrup Take 5 mL by mouth.     QUEtiapine  (SEROquel  XR) 150 mg 24 hr tablet Take 150 mg by mouth nightly.     rizatriptan MLT (MAXALT-MLT) 10 mg disintegrating tablet DISSOLVE 1 TABLET IN MOUTH AS NEEDED FOR MIGRAINE, MAY REPEAT X 1 IN 2 HOURS IF NEEDED     sennosides-docusate sodium  (PERICOLACE) 8.6-50 mg per tablet Take 1 tablet by mouth 2 (two) times a day for 30 days. 60 tablet 0   SUMAtriptan (IMITREX) 100 mg tablet      traZODone  (DESYREL ) 50 mg tablet Take 25 mg by mouth nightly as needed.     No current facility-administered medications for this visit.

## 2024-11-13 ENCOUNTER — Other Ambulatory Visit: Payer: Self-pay

## 2024-11-13 ENCOUNTER — Emergency Department (HOSPITAL_COMMUNITY)
Admission: EM | Admit: 2024-11-13 | Discharge: 2024-11-15 | Disposition: A | Payer: MEDICAID | Source: Home / Self Care | Attending: Emergency Medicine | Admitting: Emergency Medicine

## 2024-11-13 DIAGNOSIS — M545 Low back pain, unspecified: Secondary | ICD-10-CM | POA: Diagnosis present

## 2024-11-13 LAB — BASIC METABOLIC PANEL WITH GFR
Anion gap: 10 (ref 5–15)
BUN: 7 mg/dL (ref 6–20)
CO2: 25 mmol/L (ref 22–32)
Calcium: 9.1 mg/dL (ref 8.9–10.3)
Chloride: 104 mmol/L (ref 98–111)
Creatinine, Ser: 0.76 mg/dL (ref 0.44–1.00)
GFR, Estimated: >60 mL/min
Glucose, Bld: 79 mg/dL (ref 70–99)
Potassium: 4.3 mmol/L (ref 3.5–5.1)
Sodium: 139 mmol/L (ref 135–145)

## 2024-11-13 LAB — CBC
HCT: 42.2 % (ref 36.0–46.0)
Hemoglobin: 13.7 g/dL (ref 12.0–15.0)
MCH: 31 pg (ref 26.0–34.0)
MCHC: 32.5 g/dL (ref 30.0–36.0)
MCV: 95.5 fL (ref 80.0–100.0)
Platelets: 194 K/uL (ref 150–400)
RBC: 4.42 MIL/uL (ref 3.87–5.11)
RDW: 12.2 % (ref 11.5–15.5)
WBC: 7.3 K/uL (ref 4.0–10.5)
nRBC: 0 % (ref 0.0–0.2)

## 2024-11-13 MED ORDER — SODIUM CHLORIDE 0.9 % IV BOLUS
1000.0000 mL | Freq: Once | INTRAVENOUS | Status: AC
  Administered 2024-11-13: 1000 mL via INTRAVENOUS

## 2024-11-13 MED ORDER — KETOROLAC TROMETHAMINE 15 MG/ML IJ SOLN
15.0000 mg | Freq: Once | INTRAMUSCULAR | Status: AC
  Administered 2024-11-13: 15 mg via INTRAVENOUS
  Filled 2024-11-13: qty 1

## 2024-11-13 MED ORDER — LORAZEPAM 1 MG PO TABS
1.0000 mg | ORAL_TABLET | Freq: Once | ORAL | Status: AC
  Administered 2024-11-13: 1 mg via ORAL
  Filled 2024-11-13: qty 1

## 2024-11-13 MED ORDER — DIPHENHYDRAMINE HCL 50 MG/ML IJ SOLN
12.5000 mg | Freq: Once | INTRAMUSCULAR | Status: AC
  Administered 2024-11-13: 12.5 mg via INTRAVENOUS
  Filled 2024-11-13: qty 1

## 2024-11-13 MED ORDER — OXYCODONE HCL 5 MG PO TABS
5.0000 mg | ORAL_TABLET | Freq: Once | ORAL | Status: AC
  Administered 2024-11-13: 5 mg via ORAL
  Filled 2024-11-13: qty 1

## 2024-11-13 MED ORDER — FENTANYL CITRATE (PF) 50 MCG/ML IJ SOSY
50.0000 ug | PREFILLED_SYRINGE | Freq: Once | INTRAMUSCULAR | Status: AC
  Administered 2024-11-13: 50 ug via INTRAVENOUS
  Filled 2024-11-13: qty 1

## 2024-11-13 MED ORDER — HYDROMORPHONE HCL 1 MG/ML IJ SOLN
0.5000 mg | Freq: Once | INTRAMUSCULAR | Status: AC
  Administered 2024-11-13: 0.5 mg via INTRAVENOUS
  Filled 2024-11-13: qty 1

## 2024-11-13 MED ORDER — PROMETHAZINE (PHENERGAN) 6.25MG IN NS 50ML IVPB
6.2500 mg | Freq: Four times a day (QID) | INTRAVENOUS | Status: DC | PRN
  Filled 2024-11-13: qty 50

## 2024-11-13 NOTE — ED Notes (Signed)
 Ambulated patient to the bathroom and she was in excruciating pain. She was shaking so bad and even had to stop several times due to the amount of pain she was in

## 2024-11-13 NOTE — ED Provider Notes (Signed)
 "  EMERGENCY DEPARTMENT AT [facility] Provider Note   CSN: [id number] Arrival date & time: 11/13/24  0500     Patient presents with: Back Pain  HPI Abigail Harding is a 34 y.o. female with history of right hemilaminectomy at L4 and L5 with noted disc herniation in that area presenting for back pain.  Reports that it is her chronic back pain but much worse in the last week.  Denies new injury or fever.  Denies IV drug use.  Denies any urinary or bowel changes and abdominal pain.  She states she is usually hunched over or having to lie down because of the pain is excruciating.  She is being followed by a spine surgeon who she reports that is discussing whether or not she needs another surgery.  She has been taking prednisone  and meloxicam with minimal relief.  Did receive 100 mcg of fentanyl  and route by EMS.    Back Pain      Prior to Admission medications  Medication Sig Start Date End Date Taking? Authorizing Provider  busPIRone  (BUSPAR ) 7.5 MG tablet Take 7.5 mg by mouth 3 (three) times daily. 10/30/23   [provider]  Drospirenone  (SLYND ) 4 MG TABS Take 1 tablet by mouth daily. Pt skips placebo pills and continues to next pack in order to skip period 09/30/22   Doretha Folks, MD  FLUoxetine  (PROZAC ) 40 MG capsule Take 1 capsule (40 mg total) by mouth daily. 10/09/24   Lue Elsie BROCKS, MD  lidocaine  (LIDODERM ) 5 % Place 1 patch onto the skin daily. Remove & Discard patch within 12 hours or as directed by MD 10/09/24   Lue Elsie BROCKS, MD  meloxicam (MOBIC) 7.5 MG tablet Take 7.5 mg by mouth daily as needed for pain. 08/28/24   [provider]  pregabalin  (LYRICA ) 75 MG capsule Take 1 capsule (75 mg total) by mouth 2 (two) times daily. 10/08/24   Lue Elsie BROCKS, MD  promethazine  (PHENERGAN ) 25 MG tablet Take 1 tablet (25 mg total) by mouth every 6 (six) hours as needed for nausea or vomiting. Patient taking differently: Take  12.5-25 mg by mouth every 6 (six) hours as needed for nausea or vomiting. 01/11/24   Doretha Folks, MD  rizatriptan (MAXALT-MLT) 10 MG disintegrating tablet Take 10 mg by mouth as needed for migraine. 10/30/23   [provider]  traZODone  (DESYREL ) 100 MG tablet Take 100 mg by mouth at bedtime as needed.    [provider]    Allergies: Hydrocodone , Ketorolac , Morphine and codeine, Tramadol, Venlafaxine , Betamethasone, Flonase  [fluticasone ], Zofran  [ondansetron ], and Hydroxyzine     Review of Systems  Musculoskeletal:  Positive for back pain.     Physical Exam   Vitals:   11/13/24 1416 11/13/24 1544  BP: (!) 89/47 (!) 90/55  Pulse: 61   Resp: 18   Temp: 97.6 F (36.4 C)   SpO2: 98%     CONSTITUTIONAL:  well-appearing, in distress d/t back pain NEURO:  Alert and oriented x 3, CN 3-12 grossly intact EYES:  eyes equal and reactive ENT/NECK:  Supple, no stridor  CARDIO:  regular rate and rhythm, appears well-perfused  PULM:  No respiratory distress, CTAB GI/GU:  non-distended, soft, non tender MSK/SPINE:  No gross deformities, no edema, moves all extremities.  Reluctant to range the lower back due to her pain.  Did have midline spinous tenderness but also generalized paraspinal tenderness in the lumbar region as well.  SKIN:  no rash, atraumatic  *  Additional and/or pertinent findings included in MDM below  (all labs ordered are listed, but only abnormal results are displayed) Labs Reviewed  BASIC METABOLIC PANEL WITH GFR  CBC  URINALYSIS, ROUTINE W REFLEX MICROSCOPIC  HCG, SERUM, QUALITATIVE    EKG: None  Radiology: No results found.   Procedures   Medications Ordered in the ED  promethazine  (PHENERGAN ) 6.25 mg/NS 50 mL IVPB (has no administration in time range)  HYDROmorphone  (DILAUDID ) injection 0.5 mg (0.5 mg Intravenous Given 11/13/24 0907)  oxyCODONE  (Oxy IR/ROXICODONE ) immediate release tablet 5 mg (5 mg Oral Given 11/13/24 1242)  sodium  chloride 0.9 % bolus 1,000 mL (0 mLs Intravenous Stopped 11/13/24 1751)  fentaNYL  (SUBLIMAZE ) injection 50 mcg (50 mcg Intravenous Given 11/13/24 1605)  ketorolac  (TORADOL ) 15 MG/ML injection 15 mg (15 mg Intravenous Given 11/13/24 1751)  diphenhydrAMINE  (BENADRYL ) injection 12.5 mg (12.5 mg Intravenous Given 11/13/24 1751)  fentaNYL  (SUBLIMAZE ) injection 50 mcg (50 mcg Intravenous Given 11/13/24 1751)                                    Medical Decision Making Amount and/or Complexity of Data Reviewed Labs: ordered.  Risk Prescription drug management.   34 year old well-appearing female presenting for back pain.  Exam notable for generalized lumbar and spinous tenderness to palpation.  Overall she looks well but clearly distressed secondary to her pain.  Did review her chart which revealed she is followed by Dr. Wallene, a spine specialist at [facility].  Was able to talk with him and he recommended transfer given that her pain is not well-controlled and they have been discussing potential utility of surgery.  He agreed to transfer to [facility].  Patient was transiently hypotensive after receiving narcotics but improved with 1 L of fluids.  Pain overall is improved but still significant.  Has difficulty ambulating and bearing weight.  Awaiting transfer by [facility] at this time.     Final diagnoses:  Low back pain, unspecified back pain laterality, unspecified chronicity, unspecified whether sciatica present    ED Discharge Orders     None          Lang Norleen POUR, PA-C 11/13/24 1839    Patsey Lot, MD 11/13/24 2316  "

## 2024-11-13 NOTE — ED Triage Notes (Addendum)
 Patient arrived with [facility] from home reports chronic low back pain worsened this week , denies injury /ambulatory . Received Fentanyl  100 mcg IV by EMS prior to arrival .History of herniated disk.

## 2024-11-13 NOTE — ED Notes (Signed)
 [facility] and [facility] have been in contact about transporting this patient to [facility]

## 2024-11-13 NOTE — ED Notes (Signed)
 [facility]'s office contacted at the request of Dr. DOROTHA Essex.  Dr. Rosine was paged to speak with Dr. DOROTHA Essex.

## 2024-11-18 NOTE — Discharge Summary (Signed)
 Orthopedic Surgery Discharge Summary  Patient ID: Abigail Harding [id number] 34 y.o. [birth year]  Admit date: 11/13/2024 Admitting Physician: Norleen Capri, MD Admission Diagnoses:  Back pain [M54.9] Radiculopathy [M54.10] Low back pain [M54.50] Spinal stenosis of lumbar region with neurogenic claudication [M48.062]     Discharge date: 11/21/2024  Discharge Physician: Norleen Capri, MD Discharge Diagnoses:  Principal Problem:   Low back pain Active Problems:   Generalized anxiety disorder   Right kidney stone   Acute postoperative abdominal pain   Status post lumbar discectomy   Degenerative lumbar spinal stenosis   L4-L5 Disc Herniation   Bipolar disorder (CMD)   Chronic abdominal pain   Acute exacerbation of chronic low back pain   Spinal stenosis of lumbar region with neurogenic claudication Resolved Problems:   * No resolved hospital problems. *    Operative Procedures: L4-5 anterior lumbar discectomy and fusion with instrumentation and bone graft (Infuse)   Indications for Operative Procedures:  Abigail Harding is a 34 year old woman who has been experiencing severe exacerbation of her low back pain. She also is complaining of pain radiating into her thigh and leg. She initially presented to the emergency department at [facility]. They attempted to manage her pain but were unsuccessful. She was then transferred here. Due to her pathology and intractable pain, she presents now for operative management of this condition.   Hospital Course:  34 y.o. female who came in and underwent the above stated procedure on 11/19/2023. For more details please refer to the operative note written on 11/19/2023. The patient tolerated the procedure and was transferred to the orthopedic floor for post operative management and therapy. The patient's pain was initially managed with PO and IV pain medication. At discharge the patient verbalizes adequate pain control on PO pain medication. The patient's  foley catheter was removed on POD #1 and the patient was able to void prior to discharge. The patient worked with Physical and Occupational therapy and is safe to discharge home at this time. The patient has a aquacel dressing in place and this can be removed 7 days. The patient weight bearing status will be as tolerated with no twisting, bending or lifting greater than 10 pounds. The patient was given SCDs and early ambulation for DVT prophylaxis.   At the time of discharge, the patient was afebrile, vital signs were stable, in no acute distress.  The patient meets all goals necessary for discharge from a medical, surgical, and therapy standpoint, and thus the patient is stable and safe for discharge.  New discharge medications discussed in detail and the patient stated understanding of use and administration.  The patient is verbalizing understanding of all discharge instructions and therefore was released.     Medical Co-morbidities at Hospital Discharge: Anxiety, bipolar, hx kidney stone, marijuana use, chronic pain   Surgical operations performed during hospitalization: Procedures (LRB): L4-5 anterior lumbar discectomy and fusion with instrumentation and bone graft (Infuse) (N/A) LAPAROTOMY EXPLORATORY  for ALIF exposure (N/A)   Consults:  Acute Pain Consult:  The pain diagnosis and management options were discussed with Ms. Swan Fairfax and she verbalized understanding that we would make recommendations and the primary team will determine final plan.     This patient was seen under the general remote supervision of Dr. Delon Mae.     Pharmacological: - continue acetaminophen  1000 mg po q 6 hours scheduled (OTC product PRN at discharge) - bowel regimen of choice  - continue duloxetine  30 mg po daily for  chronic back pain (dose may be increased to 60 mg if needed; 30-day Rx at discharge with PCP follow up) - continue hydroxyzine  50 mg po q 6 hours PRN anxiety (may take  between buspirone  doses, 30-day PRN Rx at discharge) - Plan was to continue IV ketamine infusion x 72 hours, yet the primary team has discontinued the infusion - discontinue IV hydromorphone  PCA (done by primary team) - The patient used 13.2 mg of IV hydromorphone  in the past 24 hours (with a 50% reduction in dose due to cross-tolerance, the minimal recommended dose would be 20 mg po q 4 hours PRN moderate/severe pain (up to 30 mg for severe pain if required with 7-day weaning Rx at discharge)- Primary team has ordered oxycodone  5-10 mg po q 4 hours PRN moderate/severe pain respectively - continue methocarbamol  1000 mg po q 6 hours scheduled (30-day PRN Rx at discharge) - continue buspirone  7.5 mg po tid (per home regimen) - fluoxetine  40 mg po q hs bedtime instead of daytime per patient request - continue pregabalin  75 mg po tid (30-day Rx at discharge with PCP follow-up) - NSAID deferred due to fusion   Non-pharmacological: - heating pad PRN - ice pack PRN   Education: - review of postoperative pain management plan   Health Promotion: - cessation of marijuana   Referral: - consider Psychiatry consult for anxiety management if needed   Discharge Planning: - Please continue the pain regimen at discharge, transfer to a network hospital or Rehab/SNF as applicable.  Disposition: Home or Self Care  Patient Instructions:  Hospital Medications Before Discharge  Scheduled  acetaminophen  (TYLENOL ) 500 mg tablet, Take 2 tablets (1,000 mg total) by mouth every 6 (six) hours.  busPIRone  (BUSPAR ) 15 mg tablet, Take 0.5 tablets (7.5 mg total) by mouth 3 (three) times a day.  DULoxetine  (CYMBALTA ) 30 mg capsule, Take 1 capsule (30 mg total) by mouth daily.  enoxaparin  (LOVENOX ) 40 mg/0.4 mL syrg, Inject 0.4 mL (40 mg total) under the skin at bedtime. Administer into the abdominal subcutaneous tissue, alternating injection sites between the left and right abdominal wall, at least 2 inches  from umbilicus/not over rectus muscles. If administering entire syringe contents, do NOT expel air bubble from syringe. If administering partial dose of syringe, eject syringe contents until the prescribed dose is left in the syringe.  FLUoxetine  (PROzac ) 20 mg capsule, Take 2 capsules (40 mg total) by mouth at bedtime.  lidocaine  (SALONPAS) 4 % patch, Apply 1 patch topically daily. Apply to affected area per patient  methocarbamoL  (ROBAXIN ) 500 mg tablet, Take 2 tablets (1,000 mg total) by mouth every 6 (six) hours.  pregabalin  (LYRICA ) 75 mg capsule, Take 1 capsule (75 mg total) by mouth 3 (three) times a day.  QUEtiapine  (SEROquel  XR) 150 mg 24 hr tablet, Take 1 tablet (150 mg total) by mouth nightly.  sennosides-docusate sodium  (PERICOLACE) 8.6-50 mg per tablet, Take 2 tablets by mouth daily.  PRN  benzocaine -menthoL  (CEPACOL SORE THROAT) lozg lozenge, Dissolve 1 lozenge in the mouth every 2 (two) hours as needed for sore throat.  camphor-menthoL  0.5-0.5 % lotion, Apply topically as needed (itchiness). Apply to any itchy area  dextrose (D50W) 50 % syrg injection, Infuse 25 mL (12.5 g total) into a venous catheter as needed for blood glucose LESS THAN 70 mg/dL (ONLY for unresponsive or unable to swallow or NPO patient). Give STAT IV PUSH. Recheck blood glucose in 15 minutes and may repeat dose if needed. MAX of 2 doses per episode.  dextrose (TRUEPLUS GLUCOSE) 15 gram/32 mL glpk oral gel, Take 32 mL (15 g total) by mouth as needed for blood glucose LESS THAN 70 mg/dL (As an oral 15 g option). One 37.5 g tube of dextrose 40% gel contains 15 g of dextrose. Squeeze the glucose gel between the gums and buccal mucosa, keeping the patient's head tilted slightly to the side.IDDSI Level 4 Extremely Thick Liquid/Pureed Solid  dicyclomine  (Bentyl ) 20 mg tab, Take 1 tablet (20 mg total) by mouth 4 (four) times a day as needed (abdominal pain/cramping).  diphenhydrAMINE  (BENADRYL ) 50 mg/mL  injection, Infuse 0.5 mL (25 mg total) into a venous catheter every 6 (six) hours as needed for itching or allergies. 2nd line for itching, allergies  famotidine  (PEPCID ) 20 mg tablet, Take 1 tablet (20 mg total) by mouth every 12 (twelve) hours as needed for heartburn.  HYDROmorphone  (DILAUDID ) 1 mg/mL injection, Infuse 0.6 mL (0.6 mg total) into a venous catheter once as needed for severe pain (7-10). For IV push administration, give slowly over 2-5 minutesFor IV push administration, give slowly over 2-5 minutes  hydrOXYzine  (ATARAX ) 25 mg tablet, Take 2 tablets (50 mg total) by mouth every 6 (six) hours as needed for anxiety, allergies or itching.  LORazepam  (ATIVAN ) 0.5 mg tablet, Take 1 tablet (0.5 mg total) by mouth 2 (two) times a day as needed for anxiety.  melatonin 1 mg tablet, Take 1 tablet (1 mg total) by mouth nightly as needed for sleep (insomnia).  naloxone (NARCAN) 0.4 mg/mL injection, Inject 0.25 mL (0.1 mg total) into the muscle every 10 (ten) minutes as needed for opioid reversal or respiratory depression (See Admin Inst.). For patient arousable but overly sedated when unstimulated. Notify managing provider. MAX dose 1 mg.  naloxone (NARCAN) 0.4 mg/mL injection, Infuse 1 mL (0.4 mg total) into a venous catheter once as needed for opioid reversal or respiratory depression (For patient obtunded or unarousable. May repeat with 0.2 mg dose in 2 minutes if no response. Max dose 1 mg). IV PUSH  naloxone (NARCAN) 0.4 mg/mL injection, Infuse 0.5 mL (0.2 mg total) into a venous catheter Every 2 (two) minutes as needed. for opioid reversal or respiratory depression (For Patient obtunded or unarousable did not respond to 0.4 mg. Notify managing Provider. Max dose 1 mg). IV PUSH  ondansetron  (ZOFRAN ) 2 mg/mL injection, Infuse 2 mL (4 mg total) into a venous catheter every 6 (six) hours as needed for nausea or vomiting. 2nd OPTION: Give if nausea is unrelieved 30 mins after 1st option.IV  push: doses less than or equal to 8 mg may be given undiluted (2 mg/mL) by direct IV push over at least 1 minute  ondansetron  (ZOFRAN -ODT) 4 mg disintegrating tablet, Dissolve 1 tablet (4 mg total) on tongue every 6 (six) hours as needed for nausea or vomiting. 1st OPTION: Move to 2nd Option if patient unable to tolerate oral tablet OR if nausea/vomiting unrelieved after 30 minutes. Notify MD if no 2nd option.  oxyCODONE  (ROXICODONE ) 5 mg immediate release tablet, Take 1 tablet (5 mg total) by mouth every 4 (four) hours as needed for moderate pain (4-6). **OR** oxyCODONE  (ROXICODONE ) 5 mg immediate release tablet, Take 2 tablets (10 mg total) by mouth every 4 (four) hours as needed for severe pain (7-10).  prochlorperazine  (COMPAZINE ) 10 mg/2 mL (5 mg/mL) injection, Infuse 1 mL (5 mg total) into a venous catheter every 6 (six) hours as needed for nausea or vomiting. May be administered by slow IV push at a rate  not exceeding 5 mg/minute or by IV infusion. Do not administer as a bolus injection  promethazine  (PHENERGAN ) 25 mg suppository, Insert 1 suppository (25 mg total) into the rectum every 4 (four) hours as needed for nausea or vomiting. 3rd OPTION: Give if nausea/vomiting is unrelieved 30 mins after 2nd option.  traZODone  (DESYREL ) 100 mg tablet, Take 1 tablet (100 mg total) by mouth nightly as needed for sleep.   Activity: no bending, twisting, lifting greater than 10 pounds Diet: regular diet Wound Care: keep wound clean and dry Weight Bearing Status: as tolerated DVT PPx: ambulation  The [place]  Controlled Substance Reporting System was reviewed for Carlyon Earnie Buck  prior to providing Remington Marie Holden with discharge narcotic prescription. Date of last fill pregabalin  75mg  #60, 30 day supply on 10/08/2024  Please contact your doctor or seek medical assistance if you experience any of the following: 1. Fever greater than 101.5 F. 2. Decrease in urinary output. 3.  Increased warmth, swelling, redness, or pain at your incision/wound site. 4. Increased drainage or bad odor at your incision/wound site.  5. Nausea/vomiting that does not stop.  6. Numbness, tingling, or discoloration of extremity.  7. Unable to drink fluids.  8. Uncontrollable pain.  9. Any other concerning symptoms.   Please, call 911 or go to your nearest emergency room if you experience any of the following: 1. Change in speech, vision, or ability to walk 2. Chest pain.  3. Difficulty breathing or shortness of breath.  Follow-up:  Future Appointments  Date Time Provider Department Center  12/02/2024 11:00 AM Ronal DELENA Littler, NP [facility] SPINE [facility]  12/31/2024  2:00 PM Norleen Capri, MD [facility] SPINE [facility]    Sheria Edrie Herd, MD  Time Spent on Discharge: 30 minutes

## 2024-11-28 ENCOUNTER — Encounter (HOSPITAL_COMMUNITY): Payer: Self-pay

## 2024-11-28 ENCOUNTER — Other Ambulatory Visit: Payer: Self-pay

## 2024-11-28 ENCOUNTER — Emergency Department (HOSPITAL_COMMUNITY): Payer: MEDICAID

## 2024-11-28 ENCOUNTER — Emergency Department (HOSPITAL_COMMUNITY)
Admission: EM | Admit: 2024-11-28 | Discharge: 2024-11-29 | Disposition: A | Discharge status: Home / Self Care | Payer: MEDICAID | Attending: Emergency Medicine | Admitting: Emergency Medicine

## 2024-11-28 DIAGNOSIS — M545 Low back pain, unspecified: Secondary | ICD-10-CM | POA: Insufficient documentation

## 2024-11-28 DIAGNOSIS — M79662 Pain in left lower leg: Secondary | ICD-10-CM | POA: Insufficient documentation

## 2024-11-28 LAB — CBC WITH DIFFERENTIAL/PLATELET
Abs Immature Granulocytes: 0.1 K/uL — ABNORMAL HIGH (ref 0.00–0.07)
Basophils Absolute: 0 K/uL (ref 0.0–0.1)
Basophils Relative: 0 %
Eosinophils Absolute: 0.4 K/uL (ref 0.0–0.5)
Eosinophils Relative: 4 %
HCT: 33.8 % — ABNORMAL LOW (ref 36.0–46.0)
Hemoglobin: 11.3 g/dL — ABNORMAL LOW (ref 12.0–15.0)
Immature Granulocytes: 1 %
Lymphocytes Relative: 23 %
Lymphs Abs: 2.2 K/uL (ref 0.7–4.0)
MCH: 31.2 pg (ref 26.0–34.0)
MCHC: 33.4 g/dL (ref 30.0–36.0)
MCV: 93.4 fL (ref 80.0–100.0)
Monocytes Absolute: 0.9 K/uL (ref 0.1–1.0)
Monocytes Relative: 9 %
Neutro Abs: 6 K/uL (ref 1.7–7.7)
Neutrophils Relative %: 63 %
Platelets: 315 K/uL (ref 150–400)
RBC: 3.62 MIL/uL — ABNORMAL LOW (ref 3.87–5.11)
RDW: 12 % (ref 11.5–15.5)
WBC: 9.6 K/uL (ref 4.0–10.5)
nRBC: 0 % (ref 0.0–0.2)

## 2024-11-28 LAB — BASIC METABOLIC PANEL WITH GFR
Anion gap: 11 (ref 5–15)
BUN: 17 mg/dL (ref 6–20)
CO2: 21 mmol/L — ABNORMAL LOW (ref 22–32)
Calcium: 8.7 mg/dL — ABNORMAL LOW (ref 8.9–10.3)
Chloride: 106 mmol/L (ref 98–111)
Creatinine, Ser: 0.69 mg/dL (ref 0.44–1.00)
GFR, Estimated: >60 mL/min
Glucose, Bld: 99 mg/dL (ref 70–99)
Potassium: 4.1 mmol/L (ref 3.5–5.1)
Sodium: 138 mmol/L (ref 135–145)

## 2024-11-28 LAB — HCG, QUANTITATIVE, PREGNANCY: hCG, Beta Chain, Quant, S: <1 m[IU]/mL

## 2024-11-28 MED ORDER — FENTANYL CITRATE (PF) 50 MCG/ML IJ SOSY
50.0000 ug | PREFILLED_SYRINGE | Freq: Once | INTRAMUSCULAR | Status: AC
  Administered 2024-11-28: 50 ug via INTRAVENOUS
  Filled 2024-11-28: qty 1

## 2024-11-28 MED ORDER — HYDROMORPHONE HCL 1 MG/ML IJ SOLN
0.5000 mg | Freq: Once | INTRAMUSCULAR | Status: AC
  Administered 2024-11-28: 0.5 mg via INTRAVENOUS
  Filled 2024-11-28: qty 1

## 2024-11-28 MED ORDER — SODIUM CHLORIDE 0.9 % IV SOLN
12.5000 mg | Freq: Four times a day (QID) | INTRAVENOUS | Status: DC | PRN
  Administered 2024-11-28: 12.5 mg via INTRAVENOUS
  Filled 2024-11-28: qty 12.5

## 2024-11-28 MED ORDER — ENOXAPARIN SODIUM 80 MG/0.8ML IJ SOSY
70.0000 mg | PREFILLED_SYRINGE | Freq: Once | INTRAMUSCULAR | Status: AC
  Administered 2024-11-28: 70 mg via SUBCUTANEOUS
  Filled 2024-11-28: qty 0.7

## 2024-11-28 MED ORDER — DIPHENHYDRAMINE HCL 50 MG/ML IJ SOLN
12.5000 mg | Freq: Once | INTRAMUSCULAR | Status: AC
  Administered 2024-11-28: 12.5 mg via INTRAVENOUS
  Filled 2024-11-28: qty 1

## 2024-11-28 NOTE — Discharge Instructions (Addendum)
 Please return here tomorrow for DVT study.  Please return sooner for any worsening symptoms like we discussed.

## 2024-11-28 NOTE — ED Notes (Signed)
 Pt ambulatory to BR w/ steady gait, in NAD

## 2024-11-28 NOTE — ED Provider Notes (Cosign Needed Addendum)
 " Abigail Harding EMERGENCY DEPARTMENT AT [facility] Provider Note   CSN: [id number] Arrival date & time: 11/28/24  2002     Patient presents with: Back Pain   Abigail Harding is a 34 y.o. female patient status post L4-L5 lumbar discectomy and fusion on 11/18/2024 who presents to the emergency department today for further evaluation of left lower leg pain and lower back pain.  She states that she was having some pain initially the pain really started ramping up a couple of days ago.  Got worse when she tried to bend over to wash her hair yesterday.  She is complaining of pain to lower back, some leg weakness in the left leg and some numbness to the bilateral feet.  She denies any bowel or bladder incontinence.  No fevers or chills.    Back Pain      Prior to Admission medications  Medication Sig Start Date End Date Taking? Authorizing Provider  busPIRone  (BUSPAR ) 7.5 MG tablet Take 7.5 mg by mouth 3 (three) times daily. 10/30/23   [provider]  Drospirenone  (SLYND ) 4 MG TABS Take 1 tablet by mouth daily. Pt skips placebo pills and continues to next pack in order to skip period 09/30/22   Doretha Folks, MD  FLUoxetine  (PROZAC ) 40 MG capsule Take 1 capsule (40 mg total) by mouth daily. 10/09/24   Lue Elsie BROCKS, MD  lidocaine  (LIDODERM ) 5 % Place 1 patch onto the skin daily. Remove & Discard patch within 12 hours or as directed by MD 10/09/24   Lue Elsie BROCKS, MD  meloxicam (MOBIC) 7.5 MG tablet Take 7.5 mg by mouth daily as needed for pain. 08/28/24   [provider]  pregabalin  (LYRICA ) 75 MG capsule Take 1 capsule (75 mg total) by mouth 2 (two) times daily. 10/08/24   Lue Elsie BROCKS, MD  promethazine  (PHENERGAN ) 25 MG tablet Take 1 tablet (25 mg total) by mouth every 6 (six) hours as needed for nausea or vomiting. Patient taking differently: Take 12.5-25 mg by mouth every 6 (six) hours as needed for nausea or vomiting. 01/11/24   Doretha Folks, MD  rizatriptan (MAXALT-MLT) 10 MG disintegrating tablet Take 10 mg by mouth as needed for migraine. 10/30/23   [provider]  traZODone  (DESYREL ) 100 MG tablet Take 100 mg by mouth at bedtime as needed.    [provider]    Allergies: Hydrocodone , Ketorolac , Morphine and codeine, Tramadol, Venlafaxine , Betamethasone, Flonase  [fluticasone ], Zofran  [ondansetron ], and Hydroxyzine     Review of Systems  Musculoskeletal:  Positive for back pain.  All other systems reviewed and are negative.   Updated Vital Signs BP (!) 95/57   Pulse 62   Temp 98.7 F (37.1 C) (Oral)   Resp 17   Ht 5' 4 (1.626 m)   Wt 69.9 kg   SpO2 98%   BMI 26.43 kg/m   Physical Exam Vitals and nursing note reviewed.  Constitutional:      General: She is not in acute distress.    Appearance: Normal appearance.  HENT:     Head: Normocephalic and atraumatic.  Eyes:     General:        Right eye: No discharge.        Left eye: No discharge.  Cardiovascular:     Comments: Regular rate and rhythm.  S1/S2 are distinct without any evidence of murmur, rubs, or gallops.  Radial pulses are 2+ bilaterally.  Dorsalis pedis pulses are 2+ bilaterally.  No  evidence of pedal edema. Pulmonary:     Comments: Clear to auscultation bilaterally.  Normal effort.  No respiratory distress.  No evidence of wheezes, rales, or rhonchi heard throughout. Abdominal:     General: Abdomen is flat. Bowel sounds are normal. There is no distension.     Tenderness: There is no abdominal tenderness. There is no guarding or rebound.     Comments: Well-healing abdominal incision.  No evidence of purulence or wound dehiscence  Musculoskeletal:        General: Normal range of motion.     Cervical back: Neck supple.     Comments: Good dorsiflexion and plantarflexion.  Sensations intact and equal.  Skin:    General: Skin is warm and dry.     Findings: No rash.  Neurological:     General: No focal deficit present.      Mental Status: She is alert.  Psychiatric:        Mood and Affect: Mood normal.        Behavior: Behavior normal.     (all labs ordered are listed, but only abnormal results are displayed) Labs Reviewed  CBC WITH DIFFERENTIAL/PLATELET - Abnormal; Notable for the following components:      Result Value   RBC 3.62 (*)    Hemoglobin 11.3 (*)    HCT 33.8 (*)    Abs Immature Granulocytes 0.10 (*)    All other components within normal limits  BASIC METABOLIC PANEL WITH GFR - Abnormal; Notable for the following components:   CO2 21 (*)    Calcium 8.7 (*)    All other components within normal limits  HCG, QUANTITATIVE, PREGNANCY    EKG: None  Radiology: CT Lumbar Spine Wo Contrast Result Date: 11/28/2024 CLINICAL DATA:  Low back pain EXAM: CT LUMBAR SPINE WITHOUT CONTRAST TECHNIQUE: Multidetector CT imaging of the lumbar spine was performed without intravenous contrast administration. Multiplanar CT image reconstructions were also generated. RADIATION DOSE REDUCTION: This exam was performed according to the departmental dose-optimization program which includes automated exposure control, adjustment of the mA and/or kV according to patient size and/or use of iterative reconstruction technique. COMPARISON:  CT 12/22/2023, MRI 10/04/2024 FINDINGS: Segmentation: 5 lumbar type vertebrae. Alignment: Normal. Vertebrae: No acute fracture or focal pathologic process. Paraspinal and other soft tissues: Mild edema in fluid in the subcutaneous posterior paraspinal soft tissues. Small right kidney stone Disc levels: Interval anterior fusion hardware at L4-L5 with interbody device. Hardware appears intact. Previous right hemilaminectomy changes on the right at L4-L5. Remaining disc levels appear patent and without high-grade canal stenosis IMPRESSION: 1. Interval anterior fusion hardware at L4-L5 with interbody device. Hardware appears intact. No fracture is identified. No abnormal fluid collections  allowing for non contrasted exam. 2. Small right kidney stone Electronically Signed   By: Luke Bun M.D.   On: 11/28/2024 22:29     Procedures   Medications Ordered in the ED  promethazine  (PHENERGAN ) 12.5 mg in sodium chloride  0.9 % 50 mL IVPB (0 mg Intravenous Stopped 11/28/24 2210)  enoxaparin  (LOVENOX ) injection 70 mg (has no administration in time range)  diphenhydrAMINE  (BENADRYL ) injection 12.5 mg (has no administration in time range)  fentaNYL  (SUBLIMAZE ) injection 50 mcg (50 mcg Intravenous Given 11/28/24 2155)  HYDROmorphone  (DILAUDID ) injection 0.5 mg (0.5 mg Intravenous Given 11/28/24 2333)     Medical Decision Making Abigail Harding is a 34 y.o. female patient who presents to the emergency department today for further evaluation of lower back pain.  Patient  presenting today roughly 10 days postop from L4-5 discectomy with spacer.  Patient states she was bending over to wash her hair when she started experiencing pain.  Since then has been across the lower back.  Also concern for possible DVT.  Unfortunately we do not have ultrasound here this evening is a left after 7.  I will plan to give her an outpatient order she can return tomorrow for an ultrasound.  Patient agreeable with this. I will get her pain under better control.  I think this is a reasonable amount of pain to be expected given the surgery.  I have a low suspicion for cauda equina or epidural abscess.  CT scan of the lumbar spine did not reveal any obvious seroma or abscess.  Patient has oxycodone  at home.  She was encouraged to return to the emergency department for any worsening symptoms.   Amount and/or Complexity of Data Reviewed Labs: ordered. Decision-making details documented in ED Course. Radiology: ordered.  Risk Prescription drug management.     Final diagnoses:  Acute bilateral low back pain without sciatica  Pain in left lower leg    ED Discharge Orders          Ordered    LE VENOUS         11/28/24 2314               Theotis Cameron HERO, PA-C 11/28/24 2330    Theotis Cameron M, PA-C 11/28/24 2346  "

## 2024-11-28 NOTE — ED Notes (Signed)
 Pt reporting she feels itchy after fentanyl  admin. No rash, edema or itching noted. Pt requesting benadryl  and more pain meds. Provider made aware

## 2024-11-28 NOTE — ED Triage Notes (Signed)
 Pt BIBA from home w/ back pain and BLE pain. Pt is post op day 10 s/p back surgery. Per EMS pt received 50 mg ketamine PTA. Pt AAO on arrival, moving all extremities. Pt denies bowel or bladder changes, CMS intact.

## 2024-11-29 ENCOUNTER — Ambulatory Visit (HOSPITAL_COMMUNITY): Payer: MEDICAID | Attending: Emergency Medicine

## 2024-12-17 ENCOUNTER — Emergency Department (HOSPITAL_COMMUNITY): Payer: Self-pay

## 2024-12-17 ENCOUNTER — Encounter (HOSPITAL_COMMUNITY): Payer: Self-pay

## 2024-12-17 ENCOUNTER — Other Ambulatory Visit: Payer: Self-pay

## 2024-12-17 ENCOUNTER — Emergency Department (HOSPITAL_COMMUNITY)
Admission: EM | Admit: 2024-12-17 | Discharge: 2024-12-17 | Disposition: A | Discharge status: Home / Self Care | Payer: Self-pay

## 2024-12-17 DIAGNOSIS — R0789 Other chest pain: Secondary | ICD-10-CM | POA: Insufficient documentation

## 2024-12-17 DIAGNOSIS — R109 Unspecified abdominal pain: Secondary | ICD-10-CM | POA: Insufficient documentation

## 2024-12-17 DIAGNOSIS — R112 Nausea with vomiting, unspecified: Secondary | ICD-10-CM | POA: Insufficient documentation

## 2024-12-17 DIAGNOSIS — F419 Anxiety disorder, unspecified: Secondary | ICD-10-CM | POA: Insufficient documentation

## 2024-12-17 LAB — CBC
HCT: 40.7 % (ref 36.0–46.0)
Hemoglobin: 13.8 g/dL (ref 12.0–15.0)
MCH: 31.1 pg (ref 26.0–34.0)
MCHC: 33.9 g/dL (ref 30.0–36.0)
MCV: 91.7 fL (ref 80.0–100.0)
Platelets: 239 10*3/uL (ref 150–400)
RBC: 4.44 MIL/uL (ref 3.87–5.11)
RDW: 12.2 % (ref 11.5–15.5)
WBC: 9.8 10*3/uL (ref 4.0–10.5)
nRBC: 0 % (ref 0.0–0.2)

## 2024-12-17 LAB — URINALYSIS, ROUTINE W REFLEX MICROSCOPIC
Bilirubin Urine: NEGATIVE
Glucose, UA: NEGATIVE mg/dL
Hgb urine dipstick: NEGATIVE
Ketones, ur: 5 mg/dL — AB
Nitrite: NEGATIVE
Protein, ur: NEGATIVE mg/dL
Specific Gravity, Urine: 1.016 (ref 1.005–1.030)
pH: 5 (ref 5.0–8.0)

## 2024-12-17 LAB — COMPREHENSIVE METABOLIC PANEL WITH GFR
ALT: 8 U/L (ref 0–44)
AST: 25 U/L (ref 15–41)
Albumin: 4.7 g/dL (ref 3.5–5.0)
Alkaline Phosphatase: 63 U/L (ref 38–126)
Anion gap: 11 (ref 5–15)
BUN: 7 mg/dL (ref 6–20)
CO2: 24 mmol/L (ref 22–32)
Calcium: 9.5 mg/dL (ref 8.9–10.3)
Chloride: 107 mmol/L (ref 98–111)
Creatinine, Ser: 0.75 mg/dL (ref 0.44–1.00)
GFR, Estimated: >60 mL/min
Glucose, Bld: 117 mg/dL — ABNORMAL HIGH (ref 70–99)
Potassium: 3.8 mmol/L (ref 3.5–5.1)
Sodium: 142 mmol/L (ref 135–145)
Total Bilirubin: 0.5 mg/dL (ref 0.0–1.2)
Total Protein: 7.5 g/dL (ref 6.5–8.1)

## 2024-12-17 LAB — LIPASE, BLOOD: Lipase: 26 U/L (ref 11–51)

## 2024-12-17 LAB — HCG, SERUM, QUALITATIVE: Preg, Serum: NEGATIVE

## 2024-12-17 LAB — TROPONIN T, HIGH SENSITIVITY: Troponin T High Sensitivity: 6 ng/L (ref 0–19)

## 2024-12-17 MED ORDER — PROCHLORPERAZINE EDISYLATE 10 MG/2ML IJ SOLN
10.0000 mg | Freq: Once | INTRAMUSCULAR | Status: AC
  Administered 2024-12-17: 10 mg via INTRAVENOUS
  Filled 2024-12-17: qty 2

## 2024-12-17 MED ORDER — IOHEXOL 350 MG/ML SOLN
75.0000 mL | Freq: Once | INTRAVENOUS | Status: AC | PRN
  Administered 2024-12-17: 75 mL via INTRAVENOUS

## 2024-12-17 MED ORDER — FENTANYL CITRATE (PF) 50 MCG/ML IJ SOSY
50.0000 ug | PREFILLED_SYRINGE | Freq: Once | INTRAMUSCULAR | Status: AC
  Administered 2024-12-17: 50 ug via INTRAVENOUS
  Filled 2024-12-17: qty 1

## 2024-12-17 MED ORDER — DIPHENHYDRAMINE HCL 50 MG/ML IJ SOLN
25.0000 mg | Freq: Once | INTRAMUSCULAR | Status: AC
  Administered 2024-12-17: 25 mg via INTRAVENOUS
  Filled 2024-12-17: qty 1

## 2024-12-17 MED ORDER — ONDANSETRON HCL 4 MG/2ML IJ SOLN
4.0000 mg | Freq: Once | INTRAMUSCULAR | Status: DC
  Filled 2024-12-17: qty 2

## 2024-12-17 MED ORDER — HYDROXYZINE HCL 25 MG PO TABS: 25.0000 mg | ORAL_TABLET | Freq: Three times a day (TID) | ORAL | 0 refills | Status: AC | PRN

## 2024-12-17 MED ORDER — HYDROMORPHONE HCL 1 MG/ML IJ SOLN
0.5000 mg | Freq: Once | INTRAMUSCULAR | Status: AC
  Administered 2024-12-17: 0.5 mg via INTRAVENOUS
  Filled 2024-12-17: qty 1

## 2024-12-17 NOTE — ED Notes (Signed)
/  Pt states she is still having pain in her abdomen and cp 7/10

## 2024-12-17 NOTE — ED Provider Notes (Signed)
 " [facility] AT [facility] Provider Note   CSN: [id number] Arrival date & time: 12/17/24  9463     Patient presents with: Chest Pain (Abdominal pain/)   Abigail Harding is a 34 y.o. female.   33 year old female presents for evaluation of chest pain and abdominal pain.  She states it woke her up out of sleep.  Had recent back surgery in which they use an anterior approach.  She states that she was under more stress yesterday than usual and also excellently got kicked in the stomach by her child.  She admits to some nausea and vomiting as well.  Denies any diarrhea or any other symptoms or concerns   Chest Pain Associated symptoms: abdominal pain   Associated symptoms: no back pain, no cough, no fever, no palpitations, no shortness of breath and no vomiting        Prior to Admission medications  Medication Sig Start Date End Date Taking? Authorizing Provider  hydrOXYzine  (ATARAX ) 25 MG tablet Take 1 tablet (25 mg total) by mouth every 8 (eight) hours as needed for up to 6 days for anxiety. 12/17/24 12/23/24 Yes Yasmina Chico L, DO  busPIRone  (BUSPAR ) 7.5 MG tablet Take 7.5 mg by mouth 3 (three) times daily. 10/30/23   [provider]  Drospirenone  (SLYND ) 4 MG TABS Take 1 tablet by mouth daily. Pt skips placebo pills and continues to next pack in order to skip period 09/30/22   Doretha Folks, MD  FLUoxetine  (PROZAC ) 40 MG capsule Take 1 capsule (40 mg total) by mouth daily. 10/09/24   Lue Elsie BROCKS, MD  lidocaine  (LIDODERM ) 5 % Place 1 patch onto the skin daily. Remove & Discard patch within 12 hours or as directed by MD 10/09/24   Lue Elsie BROCKS, MD  meloxicam (MOBIC) 7.5 MG tablet Take 7.5 mg by mouth daily as needed for pain. 08/28/24   [provider]  pregabalin  (LYRICA ) 75 MG capsule Take 1 capsule (75 mg total) by mouth 2 (two) times daily. 10/08/24   Lue Elsie BROCKS, MD  promethazine  (PHENERGAN ) 25 MG tablet  Take 1 tablet (25 mg total) by mouth every 6 (six) hours as needed for nausea or vomiting. Patient taking differently: Take 12.5-25 mg by mouth every 6 (six) hours as needed for nausea or vomiting. 01/11/24   Doretha Folks, MD  rizatriptan (MAXALT-MLT) 10 MG disintegrating tablet Take 10 mg by mouth as needed for migraine. 10/30/23   [provider]  traZODone  (DESYREL ) 100 MG tablet Take 100 mg by mouth at bedtime as needed.    [provider]    Allergies: Hydrocodone , Ketorolac , Morphine and codeine, Tramadol, Venlafaxine , Betamethasone, Flonase  [fluticasone ], Zofran  [ondansetron ], and Hydroxyzine     Review of Systems  Constitutional:  Negative for chills and fever.  HENT:  Negative for ear pain and sore throat.   Eyes:  Negative for pain and visual disturbance.  Respiratory:  Negative for cough and shortness of breath.   Cardiovascular:  Positive for chest pain. Negative for palpitations.  Gastrointestinal:  Positive for abdominal pain. Negative for vomiting.  Genitourinary:  Negative for dysuria and hematuria.  Musculoskeletal:  Negative for arthralgias and back pain.  Skin:  Negative for color change and rash.  Neurological:  Negative for seizures and syncope.  All other systems reviewed and are negative.   Updated Vital Signs BP (!) 97/53 (BP Location: Left Arm)   Pulse 85   Temp (!) 97.5 F (36.4 C) (Temporal)  Resp 20   Ht 5' 4 (1.626 m)   Wt 69.9 kg   SpO2 100%   BMI 26.43 kg/m   Physical Exam Vitals and nursing note reviewed.  Constitutional:      General: She is not in acute distress.    Appearance: She is well-developed. She is not ill-appearing.  HENT:     Head: Normocephalic and atraumatic.  Eyes:     Conjunctiva/sclera: Conjunctivae normal.  Cardiovascular:     Rate and Rhythm: Normal rate and regular rhythm.     Heart sounds: No murmur heard. Pulmonary:     Effort: Pulmonary effort is normal. No respiratory distress.     Breath  sounds: Normal breath sounds.  Abdominal:     Palpations: Abdomen is soft.     Tenderness: There is abdominal tenderness.  Musculoskeletal:        General: No swelling.     Cervical back: Neck supple.  Skin:    General: Skin is warm and dry.     Capillary Refill: Capillary refill takes less than 2 seconds.  Neurological:     Mental Status: She is alert.  Psychiatric:        Mood and Affect: Mood normal.     (all labs ordered are listed, but only abnormal results are displayed) Labs Reviewed  COMPREHENSIVE METABOLIC PANEL WITH GFR - Abnormal; Notable for the following components:      Result Value   Glucose, Bld 117 (*)    All other components within normal limits  URINALYSIS, ROUTINE W REFLEX MICROSCOPIC - Abnormal; Notable for the following components:   APPearance CLOUDY (*)    Ketones, ur 5 (*)    Leukocytes,Ua SMALL (*)    Bacteria, UA MANY (*)    All other components within normal limits  LIPASE, BLOOD  CBC  HCG, SERUM, QUALITATIVE  TROPONIN T, HIGH SENSITIVITY  TROPONIN T, HIGH SENSITIVITY    EKG: None  Radiology: CT ABDOMEN PELVIS W CONTRAST Result Date: 12/17/2024 CLINICAL DATA:  Acute lower abdominal pain.  Nausea. EXAM: CT ABDOMEN AND PELVIS WITH CONTRAST TECHNIQUE: Multidetector CT imaging of the abdomen and pelvis was performed using the standard protocol following bolus administration of intravenous contrast. RADIATION DOSE REDUCTION: This exam was performed according to the departmental dose-optimization program which includes automated exposure control, adjustment of the mA and/or kV according to patient size and/or use of iterative reconstruction technique. CONTRAST:  75mL OMNIPAQUE  IOHEXOL  350 MG/ML SOLN COMPARISON:  November 15, 2023 FINDINGS: Lower chest: No acute abnormality. Hepatobiliary: No focal liver abnormality is seen. No gallstones, gallbladder wall thickening, or biliary dilatation. Pancreas: Unremarkable. No pancreatic ductal dilatation or  surrounding inflammatory changes. Spleen: Normal in size without focal abnormality. Adrenals/Urinary Tract: Adrenal glands appear normal. Small nonobstructive right renal calculus is noted. No hydronephrosis or renal obstruction is noted. Urinary bladder is unremarkable. Stomach/Bowel: Stomach is unremarkable. Status post appendectomy. No evidence of bowel obstruction or inflammation. Vascular/Lymphatic: No significant vascular findings are present. No enlarged abdominal or pelvic lymph nodes. Reproductive: Uterus and bilateral adnexa are unremarkable. Other: No abdominal wall hernia or abnormality. No abdominopelvic ascites. Musculoskeletal: Status post surgical fusion of L4-5. No acute osseous abnormality is noted. IMPRESSION: 1. Small nonobstructive right renal calculus. No hydronephrosis or renal obstruction is noted. 2. No other abnormality seen in the abdomen or pelvis. Electronically Signed   By: Lynwood Landy Raddle M.D.   On: 12/17/2024 09:54   DG Chest 2 View Result Date: 12/17/2024 CLINICAL DATA:  Chest  pain EXAM: CHEST - 2 VIEW COMPARISON:  01/11/2024 FINDINGS: The lungs are clear without focal pneumonia, edema, pneumothorax or pleural effusion. The cardiopericardial silhouette is within normal limits for size. No acute bony abnormality. IMPRESSION: No active cardiopulmonary disease. Electronically Signed   By: Camellia Candle M.D.   On: 12/17/2024 06:34     Procedures   Medications Ordered in the ED  fentaNYL  (SUBLIMAZE ) injection 50 mcg (50 mcg Intravenous Given 12/17/24 0608)  prochlorperazine  (COMPAZINE ) injection 10 mg (10 mg Intravenous Given 12/17/24 9394)  HYDROmorphone  (DILAUDID ) injection 0.5 mg (0.5 mg Intravenous Given 12/17/24 0759)  iohexol  (OMNIPAQUE ) 350 MG/ML injection 75 mL (75 mLs Intravenous Contrast Given 12/17/24 0934)  prochlorperazine  (COMPAZINE ) injection 10 mg (10 mg Intravenous Given 12/17/24 0811)  diphenhydrAMINE  (BENADRYL ) injection 25 mg (25 mg Intravenous Given 12/17/24 0808)   fentaNYL  (SUBLIMAZE ) injection 50 mcg (50 mcg Intravenous Given 12/17/24 1008)                                    Medical Decision Making Cardiac monitor interpretation: Sinus rhythm, no ectopy  Patient here for abdominal pain in the setting of recent surgery after she got kicked by her child yesterday.  She is very tender on exam but labs are unremarkable.  She is feeling better after Dilaudid  fentanyl  and Benadryl  and nausea medicine as well as IV fluids.  Vitals otherwise stable.  CT abdomen pelvis shows no acute abnormality.  She states she has been under more stress lately and would like something for anxiety.  Will give her Vistaril  to use as needed.  She is advised close follow-up with her surgeon as well as primary care and psychiatry.  Advised to return to the ER for any new or worsening symptoms.  She feels comfortable being discharged home.  Problems Addressed: Abdominal pain, non-surgical: acute illness or injury Anxiety: acute illness or injury Atypical chest pain: acute illness or injury  Amount and/or Complexity of Data Reviewed External Data Reviewed: notes.    Details: Prior ED records reviewed and patient seen 11-28-2024 for back pain Labs: ordered. Decision-making details documented in ED Course.    Details: Ordered and reviewed by me and unremarkable Radiology: ordered and independent interpretation performed. Decision-making details documented in ED Course.    Details: Ordered and interpreted by me independently of radiology CT abdomen pelvis: Shows no acute abnormality Chest x-ray: Shows no acute abnormality ECG/medicine tests: ordered and independent interpretation performed. Decision-making details documented in ED Course.    Details: Ordered and interpreted by me in the absence of cardiology and shows sinus rhythm, no STEMI, or significant change when compared to prior EKG  Risk OTC drugs. Prescription drug management. Parenteral controlled substances. Drug  therapy requiring intensive monitoring for toxicity. Diagnosis or treatment significantly limited by social determinants of health.     Final diagnoses:  Atypical chest pain  Abdominal pain, non-surgical  Anxiety    ED Discharge Orders          Ordered    hydrOXYzine  (ATARAX ) 25 MG tablet  Every 8 hours PRN        12/17/24 1040               Raynette Arras L, DO 12/17/24 1332  "

## 2024-12-17 NOTE — ED Notes (Signed)
 Patient transported to CT

## 2024-12-17 NOTE — ED Triage Notes (Signed)
 Pt BIB [facility] from home c/o 9/10 burning chest pain and abdominal pain that woke her out of her sleep this morning. Pt states she recently had abdominal surgery on 11/18/24. Pt verbalizes nausea at this time.   En route 324 mg Aspirin   EMS vitals  128/80  88 HR  99% sp02 room air

## 2024-12-17 NOTE — ED Notes (Addendum)
 Pt was administerd Dilaudid . She started itching and right forearm started to turn red. RN notified EDP for Benadryl .  Pt states this is the first time for a reaction

## 2024-12-17 NOTE — Discharge Instructions (Signed)
 Take your medications as prescribed.  Follow with your primary care doctor and psychiatry for your anxiety.
